# Patient Record
Sex: Male | Born: 1935 | Race: White | Hispanic: No | Marital: Married | Smoking: Never smoker
Health system: Southern US, Community
[De-identification: ages and names within clinical notes are randomized; demographics above are authoritative.]

## PROBLEM LIST (undated history)

## (undated) ENCOUNTER — Emergency Department (HOSPITAL_COMMUNITY): Payer: Medicare Other | Source: Home / Self Care

## (undated) DIAGNOSIS — F0280 Dementia in other diseases classified elsewhere without behavioral disturbance: Secondary | ICD-10-CM

## (undated) HISTORY — PX: CHOLECYSTECTOMY: SHX55

## (undated) HISTORY — PX: BACK SURGERY: SHX140

## (undated) HISTORY — PX: HERNIA REPAIR: SHX51

## (undated) HISTORY — PX: APPENDECTOMY: SHX54

---
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
## 2003-01-21 ENCOUNTER — Ambulatory Visit (HOSPITAL_COMMUNITY): Admission: RE | Admit: 2003-01-21 | Discharge: 2003-01-21 | Payer: Self-pay | Admitting: *Deleted

## 2005-01-27 ENCOUNTER — Encounter: Admission: RE | Admit: 2005-01-27 | Discharge: 2005-01-27 | Payer: Self-pay | Admitting: Internal Medicine

## 2005-01-29 ENCOUNTER — Encounter: Admission: RE | Admit: 2005-01-29 | Discharge: 2005-01-29 | Payer: Self-pay | Admitting: Internal Medicine

## 2005-01-31 ENCOUNTER — Encounter: Admission: RE | Admit: 2005-01-31 | Discharge: 2005-01-31 | Payer: Self-pay | Admitting: Internal Medicine

## 2005-05-28 ENCOUNTER — Emergency Department (HOSPITAL_COMMUNITY): Admission: EM | Admit: 2005-05-28 | Discharge: 2005-05-28 | Payer: Self-pay | Admitting: Emergency Medicine

## 2005-11-12 ENCOUNTER — Encounter: Admission: RE | Admit: 2005-11-12 | Discharge: 2005-11-12 | Payer: Self-pay | Admitting: Neurological Surgery

## 2008-08-15 ENCOUNTER — Encounter: Admission: RE | Admit: 2008-08-15 | Discharge: 2008-08-15 | Payer: Self-pay | Admitting: General Surgery

## 2009-10-07 ENCOUNTER — Emergency Department (HOSPITAL_COMMUNITY): Admission: EM | Admit: 2009-10-07 | Discharge: 2009-10-08 | Payer: Self-pay | Admitting: Emergency Medicine

## 2009-11-06 ENCOUNTER — Encounter (INDEPENDENT_AMBULATORY_CARE_PROVIDER_SITE_OTHER): Payer: Self-pay | Admitting: General Surgery

## 2009-11-06 ENCOUNTER — Ambulatory Visit (HOSPITAL_COMMUNITY): Admission: RE | Admit: 2009-11-06 | Discharge: 2009-11-07 | Payer: Self-pay | Admitting: General Surgery

## 2011-04-01 LAB — COMPREHENSIVE METABOLIC PANEL
ALT: 21 U/L (ref 0–53)
AST: 31 U/L (ref 0–37)
Albumin: 3.8 g/dL (ref 3.5–5.2)
Alkaline Phosphatase: 93 U/L (ref 39–117)
BUN: 15 mg/dL (ref 6–23)
CO2: 29 mEq/L (ref 19–32)
Calcium: 8.7 mg/dL (ref 8.4–10.5)
Chloride: 105 mEq/L (ref 96–112)
Creatinine, Ser: 1.46 mg/dL (ref 0.4–1.5)
GFR calc Af Amer: 58 mL/min — ABNORMAL LOW (ref >60)
GFR calc non Af Amer: 48 mL/min — ABNORMAL LOW (ref >60)
Glucose, Bld: 131 mg/dL — ABNORMAL HIGH (ref 70–99)
Potassium: 4.2 mEq/L (ref 3.5–5.1)
Sodium: 141 mEq/L (ref 135–145)
Total Bilirubin: 0.7 mg/dL (ref 0.3–1.2)
Total Protein: 7.2 g/dL (ref 6.0–8.3)

## 2011-04-01 LAB — DIFFERENTIAL
Basophils Absolute: 0 10*3/uL (ref 0.0–0.1)
Basophils Relative: 0 % (ref 0–1)
Eosinophils Absolute: 0.4 10*3/uL (ref 0.0–0.7)
Eosinophils Relative: 5 % (ref 0–5)
Lymphocytes Relative: 22 % (ref 12–46)
Lymphs Abs: 1.7 10*3/uL (ref 0.7–4.0)
Monocytes Absolute: 0.6 10*3/uL (ref 0.1–1.0)
Monocytes Relative: 8 % (ref 3–12)
Neutro Abs: 5 10*3/uL (ref 1.7–7.7)
Neutrophils Relative %: 64 % (ref 43–77)

## 2011-04-01 LAB — CBC
HCT: 41.5 % (ref 39.0–52.0)
Hemoglobin: 14.3 g/dL (ref 13.0–17.0)
MCHC: 34.4 g/dL (ref 30.0–36.0)
MCV: 91.1 fL (ref 78.0–100.0)
Platelets: 176 10*3/uL (ref 150–400)
RBC: 4.55 MIL/uL (ref 4.22–5.81)
RDW: 13.5 % (ref 11.5–15.5)
WBC: 7.7 10*3/uL (ref 4.0–10.5)

## 2011-04-01 LAB — POCT CARDIAC MARKERS
CKMB, poc: 2.1 ng/mL (ref 1.0–8.0)
CKMB, poc: 3.6 ng/mL (ref 1.0–8.0)
Myoglobin, poc: 122 ng/mL (ref 12–200)
Myoglobin, poc: 123 ng/mL (ref 12–200)
Troponin i, poc: <0.05 ng/mL (ref 0.00–0.09)
Troponin i, poc: <0.05 ng/mL (ref 0.00–0.09)

## 2011-04-01 LAB — D-DIMER, QUANTITATIVE: D-Dimer, Quant: 0.51 ug/mL-FEU — ABNORMAL HIGH (ref 0.00–0.48)

## 2011-04-01 LAB — LIPASE, BLOOD: Lipase: 23 U/L (ref 11–59)

## 2014-06-27 ENCOUNTER — Encounter (INDEPENDENT_AMBULATORY_CARE_PROVIDER_SITE_OTHER): Payer: Self-pay

## 2014-06-27 ENCOUNTER — Other Ambulatory Visit: Payer: Self-pay | Admitting: Internal Medicine

## 2014-06-27 ENCOUNTER — Ambulatory Visit
Admission: RE | Admit: 2014-06-27 | Discharge: 2014-06-27 | Disposition: A | Discharge status: Home / Self Care | Payer: Medicare Other | Source: Ambulatory Visit | Attending: Internal Medicine | Admitting: Internal Medicine

## 2014-06-27 DIAGNOSIS — R1030 Lower abdominal pain, unspecified: Secondary | ICD-10-CM

## 2014-06-29 ENCOUNTER — Emergency Department (HOSPITAL_COMMUNITY)
Admission: EM | Admit: 2014-06-29 | Discharge: 2014-06-29 | Disposition: A | Discharge status: Home / Self Care | Payer: Medicare Other | Attending: Emergency Medicine | Admitting: Emergency Medicine

## 2014-06-29 ENCOUNTER — Encounter (HOSPITAL_COMMUNITY): Payer: Self-pay | Admitting: Emergency Medicine

## 2014-06-29 DIAGNOSIS — N200 Calculus of kidney: Secondary | ICD-10-CM | POA: Diagnosis present

## 2014-06-29 LAB — URINE MICROSCOPIC-ADD ON

## 2014-06-29 LAB — I-STAT CHEM 8, ED
BUN: 17 mg/dL (ref 6–23)
Calcium, Ion: 1.18 mmol/L (ref 1.13–1.30)
Chloride: 101 mEq/L (ref 96–112)
Creatinine, Ser: 1.4 mg/dL — ABNORMAL HIGH (ref 0.50–1.35)
Glucose, Bld: 142 mg/dL — ABNORMAL HIGH (ref 70–99)
HEMATOCRIT: 46 % (ref 39.0–52.0)
Hemoglobin: 15.6 g/dL (ref 13.0–17.0)
POTASSIUM: 4 meq/L (ref 3.7–5.3)
SODIUM: 142 meq/L (ref 137–147)
TCO2: 24 mmol/L (ref 0–100)

## 2014-06-29 LAB — URINALYSIS, ROUTINE W REFLEX MICROSCOPIC
Bilirubin Urine: NEGATIVE
Glucose, UA: NEGATIVE mg/dL
Ketones, ur: NEGATIVE mg/dL
Leukocytes, UA: NEGATIVE
Nitrite: NEGATIVE
Protein, ur: 30 mg/dL — AB
SPECIFIC GRAVITY, URINE: 1.027 (ref 1.005–1.030)
UROBILINOGEN UA: 1 mg/dL (ref 0.0–1.0)
pH: 5 (ref 5.0–8.0)

## 2014-06-29 MED ORDER — KETOROLAC TROMETHAMINE 30 MG/ML IJ SOLN
15.0000 mg | Freq: Once | INTRAMUSCULAR | Status: DC
  Filled 2014-06-29: qty 1

## 2014-06-29 MED ORDER — TAMSULOSIN HCL 0.4 MG PO CAPS: 0.4000 mg | ORAL_CAPSULE | Freq: Every day | ORAL | Status: DC

## 2014-06-29 MED ORDER — HYDROCODONE-ACETAMINOPHEN 5-325 MG PO TABS: 1.0000 | ORAL_TABLET | ORAL | Status: DC | PRN

## 2014-06-29 MED ORDER — ONDANSETRON HCL 4 MG PO TABS: 4.0000 mg | ORAL_TABLET | Freq: Four times a day (QID) | ORAL | Status: DC

## 2014-06-29 MED ORDER — ONDANSETRON HCL 4 MG/2ML IJ SOLN
4.0000 mg | Freq: Once | INTRAMUSCULAR | Status: AC
  Administered 2014-06-29: 4 mg via INTRAVENOUS
  Filled 2014-06-29: qty 2

## 2014-06-29 MED ORDER — SODIUM CHLORIDE 0.9 % IV BOLUS (SEPSIS)
1000.0000 mL | Freq: Once | INTRAVENOUS | Status: AC
  Administered 2014-06-29: 1000 mL via INTRAVENOUS

## 2014-06-29 MED ORDER — KETOROLAC TROMETHAMINE 15 MG/ML IJ SOLN
15.0000 mg | Freq: Once | INTRAMUSCULAR | Status: AC
  Administered 2014-06-29: 15 mg via INTRAVENOUS

## 2014-06-29 NOTE — ED Provider Notes (Signed)
TIME SEEN: 2:31 PM  CHIEF COMPLAINT: Left flank pain  HPI: Patient is a 78 year old male with no significant past medical history who presents the emergency department with left flank pain for the past 5 days. He had a CT scan as an outpatient on Thursday, 4 days ago was diagnosed with a left 4 mm kidney stone. He states that he was seen by a physician but refuses any pain medication at that time. He states that he has not been able to control his pain at home and feels that he does need pain medication now. He denies any fevers, chills, vomiting or diarrhea. He is able to urinate normally. No pain with urination.  ROS: See HPI Constitutional: no fever  Eyes: no drainage  ENT: no runny nose   Cardiovascular:  no chest pain  Resp: no SOB  GI: no vomiting GU: no dysuria Integumentary: no rash  Allergy: no hives  Musculoskeletal: no leg swelling  Neurological: no slurred speech ROS otherwise negative  PAST MEDICAL HISTORY/PAST SURGICAL HISTORY:  History reviewed. No pertinent past medical history.  MEDICATIONS:  Prior to Admission medications   Not on File    ALLERGIES:  Not on File  SOCIAL HISTORY:  History  Substance Use Topics  . Smoking status: Never Smoker   . Smokeless tobacco: Not on file  . Alcohol Use: No    FAMILY HISTORY: No family history on file.  EXAM: BP 136/56  Pulse 55  Temp(Src) 97.8 F (36.6 C) (Oral)  Resp 18  SpO2 99% CONSTITUTIONAL: Alert and oriented and responds appropriately to questions. Well-appearing; well-nourished, appears uncomfortable but is not in any distress HEAD: Normocephalic EYES: Conjunctivae clear, PERRL ENT: normal nose; no rhinorrhea; moist mucous membranes; pharynx without lesions noted NECK: Supple, no meningismus, no LAD  CARD: RRR; S1 and S2 appreciated; no murmurs, no clicks, no rubs, no gallops RESP: Normal chest excursion without splinting or tachypnea; breath sounds clear and equal bilaterally; no wheezes, no  rhonchi, no rales,  ABD/GI: Normal bowel sounds; non-distended; soft, non-tender, no rebound, no guarding, no peritoneal signs BACK:  The back appears normal and is non-tender to palpation, there is no CVA tenderness EXT: Normal ROM in all joints; non-tender to palpation; no edema; normal capillary refill; no cyanosis    SKIN: Normal color for age and race; warm NEURO: Moves all extremities equally PSYCH: The patient's mood and manner are appropriate. Grooming and personal hygiene are appropriate.  MEDICAL DECISION MAKING: Patient here with pain from his 4 mm kidney stone. We'll check basic labs, urinalysis. We'll give IV fluids, Zofran Toradol and reassess. Patient is well-appearing, nontoxic, in no distress.  ED PROGRESS: Patient has mildly elevated creatinine which has been his baseline since 2012. Urine shows large hemoglobin and many bacteria. No other sign of infection. Culture pending. He reports he is completely pain-free after 15 mg of IV Toradol. We'll discharge home with prescription for Vicodin, Zofran and Flomax. We'll give [facility] followup information. He states he has a urine strainer. He states he is started on amoxicillin for possible UTI. Discussed strict return precautions and supportive care instructions. Patient verbalizes understanding and is comfortable with plan.     Layla MawKristen N Ward, DO 06/29/14 1525

## 2014-06-29 NOTE — ED Notes (Signed)
Pt c/o left flank pain. Pt had CT scan on Thursday and has kidney stone. Pt has been trying to pass the stone but pain got to severe today. Pt denies denies any trouble with urination.

## 2014-06-29 NOTE — Discharge Instructions (Signed)

## 2014-06-30 LAB — URINE CULTURE
COLONY COUNT: NO GROWTH
Culture: NO GROWTH

## 2014-09-25 ENCOUNTER — Other Ambulatory Visit: Payer: Self-pay | Admitting: Gastroenterology

## 2014-09-25 DIAGNOSIS — R1084 Generalized abdominal pain: Secondary | ICD-10-CM

## 2014-09-26 ENCOUNTER — Inpatient Hospital Stay: Admission: RE | Admit: 2014-09-26 | Payer: Medicare Other | Source: Ambulatory Visit

## 2014-10-01 ENCOUNTER — Ambulatory Visit
Admission: RE | Admit: 2014-10-01 | Discharge: 2014-10-01 | Disposition: A | Discharge status: Home / Self Care | Payer: Medicare Other | Source: Ambulatory Visit | Attending: Gastroenterology | Admitting: Gastroenterology

## 2014-10-01 DIAGNOSIS — R1084 Generalized abdominal pain: Secondary | ICD-10-CM

## 2014-10-01 MED ORDER — IOHEXOL 300 MG/ML  SOLN
100.0000 mL | Freq: Once | INTRAMUSCULAR | Status: AC | PRN
  Administered 2014-10-01: 100 mL via INTRAVENOUS

## 2014-10-17 ENCOUNTER — Other Ambulatory Visit (INDEPENDENT_AMBULATORY_CARE_PROVIDER_SITE_OTHER): Payer: Self-pay

## 2014-10-17 DIAGNOSIS — R1084 Generalized abdominal pain: Secondary | ICD-10-CM

## 2014-10-17 DIAGNOSIS — R1012 Left upper quadrant pain: Secondary | ICD-10-CM

## 2014-10-18 ENCOUNTER — Other Ambulatory Visit (INDEPENDENT_AMBULATORY_CARE_PROVIDER_SITE_OTHER): Payer: Self-pay | Admitting: *Deleted

## 2014-10-29 ENCOUNTER — Telehealth (INDEPENDENT_AMBULATORY_CARE_PROVIDER_SITE_OTHER): Payer: Self-pay

## 2014-10-29 NOTE — Telephone Encounter (Signed)
Called and spoke to patients wife regarding CT Biopsy being cancelled.  Explained to Ms. Annie Parasingold that Dr. Annabell HowellsWrenn with Radiology did not feel patient was an ideal candidate for a CT Biopsy.  Patient was also presented at the GI Conference and Pysicians feel that Dr. Annabell HowellsWrenn is correct.  Patient may be a candidate for antibiotics or steroids but, needs an office appointment to come in to discuss in further detail with Dr. Derrell Lollingamirez.  Patient scheduled for office visit on 11/04/14 @ 2:10 with Dr. Derrell Lollingamirez.  Patient wife verbalized understanding.

## 2014-10-29 NOTE — Telephone Encounter (Signed)
-----   Message from Grace IsaacStephanie Mosher sent at 10/29/2014  3:15 PM EST ----- Neysa Bonitohristy, This patient needs to come in for an office visit per AR.  IR could not do the bx. Patient's wife calling stating he is in a lot of pain.  Thanks, [employer]ph

## 2015-02-20 DIAGNOSIS — H2513 Age-related nuclear cataract, bilateral: Secondary | ICD-10-CM | POA: Diagnosis not present

## 2015-04-29 DIAGNOSIS — R5383 Other fatigue: Secondary | ICD-10-CM | POA: Diagnosis not present

## 2015-04-29 DIAGNOSIS — Z125 Encounter for screening for malignant neoplasm of prostate: Secondary | ICD-10-CM | POA: Diagnosis not present

## 2015-04-29 DIAGNOSIS — Z79899 Other long term (current) drug therapy: Secondary | ICD-10-CM | POA: Diagnosis not present

## 2015-04-29 DIAGNOSIS — E780 Pure hypercholesterolemia: Secondary | ICD-10-CM | POA: Diagnosis not present

## 2015-04-29 DIAGNOSIS — Z1389 Encounter for screening for other disorder: Secondary | ICD-10-CM | POA: Diagnosis not present

## 2015-07-28 ENCOUNTER — Other Ambulatory Visit: Payer: Self-pay | Admitting: Physician Assistant

## 2015-07-28 DIAGNOSIS — R1012 Left upper quadrant pain: Secondary | ICD-10-CM

## 2015-07-28 DIAGNOSIS — R1011 Right upper quadrant pain: Secondary | ICD-10-CM

## 2015-07-31 ENCOUNTER — Ambulatory Visit
Admission: RE | Admit: 2015-07-31 | Discharge: 2015-07-31 | Disposition: A | Discharge status: Home / Self Care | Payer: Medicare Other | Source: Ambulatory Visit | Attending: Physician Assistant | Admitting: Physician Assistant

## 2015-07-31 DIAGNOSIS — R1012 Left upper quadrant pain: Secondary | ICD-10-CM

## 2015-07-31 DIAGNOSIS — R1011 Right upper quadrant pain: Secondary | ICD-10-CM

## 2015-07-31 MED ORDER — IOPAMIDOL (ISOVUE-300) INJECTION 61%
100.0000 mL | Freq: Once | INTRAVENOUS | Status: AC | PRN
  Administered 2015-07-31: 100 mL via INTRAVENOUS

## 2016-07-13 ENCOUNTER — Other Ambulatory Visit: Payer: Self-pay | Admitting: Internal Medicine

## 2016-07-13 DIAGNOSIS — F0390 Unspecified dementia without behavioral disturbance: Secondary | ICD-10-CM

## 2016-07-16 ENCOUNTER — Inpatient Hospital Stay: Admission: RE | Admit: 2016-07-16 | Payer: Medicare Other | Source: Ambulatory Visit

## 2016-07-16 ENCOUNTER — Ambulatory Visit
Admission: RE | Admit: 2016-07-16 | Discharge: 2016-07-16 | Disposition: A | Discharge status: Home / Self Care | Payer: Medicare Other | Source: Ambulatory Visit | Attending: Internal Medicine | Admitting: Internal Medicine

## 2017-04-01 ENCOUNTER — Ambulatory Visit (INDEPENDENT_AMBULATORY_CARE_PROVIDER_SITE_OTHER): Payer: Medicare Other | Admitting: Neurology

## 2017-04-01 ENCOUNTER — Encounter: Payer: Self-pay | Admitting: Neurology

## 2017-04-01 DIAGNOSIS — R413 Other amnesia: Secondary | ICD-10-CM | POA: Diagnosis not present

## 2017-04-01 NOTE — Progress Notes (Signed)
Reason for visit: Memory disturbance  Referring physician: Dr. Cristal [name] is a 81 y.o. male  History of present illness:  Mr. Kyle [name] is a 81 year old right-handed white male with a history of a progressive memory disorder. The wife believes that he has had memory problems for about 2 years. The patient has developed a short-term memory issue, he has difficulty with remembering names for people, he will sometimes lose his train of thought with a conversation. He will misplace things about the house on occasion. The patient still operates a motor vehicle without difficulty, he has not had problems with safety or with directions. The wife does all the finances, takes care of the medications and keeps up with appointments, and she always has. The patient is physically active, he has a good energy level. The patient denies any numbness or weakness of the face, arms, or legs. He denies problems with balance or difficulty controlling the bowels or the bladder. The patient had been on amitriptyline for a period of time, he is off the medication now. There is no significant family medical history of dementia. He comes to this office for an evaluation. He has had a CT scan of the brain that was done in July 2017 that does not show extensive white matter changes, he has been placed on Namenda and is tolerating the medication well, apparently he had diarrhea on Aricept. The patient has had blood work done to include a thyroid profile, RPR, and B12 level that were unremarkable.  History reviewed. No pertinent past medical history.  Past Surgical History:  Procedure Laterality Date  . APPENDECTOMY    . BACK SURGERY    . CHOLECYSTECTOMY    . HERNIA REPAIR      Family History  Problem Relation Age of Onset  . Pneumonia Mother   . Kidney failure Father     Social history:  reports that he has never smoked. He has never used smokeless tobacco. He reports that he does not drink alcohol or  use drugs.  Medications:  Prior to Admission medications   Medication Sig Start Date End Date Taking? Authorizing Provider  memantine (NAMENDA) 10 MG tablet Take 10 mg by mouth 2 (two) times daily.   Yes Historical Provider, MD      Allergies  Allergen Reactions  . Demerol [Meperidine] Nausea And Vomiting    ROS:  Out of a complete 14 system review of symptoms, the patient complains only of the following symptoms, and all other reviewed systems are negative.  Memory loss  Blood pressure (!) 159/74, pulse (!) 53, height 6' (1.829 m), weight 197 lb (89.4 kg).  Physical Exam  General: The patient is alert and cooperative at the time of the examination.  Eyes: Pupils are equal, round, and reactive to light. Discs are flat bilaterally.  Neck: The neck is supple, no carotid bruits are noted.  Respiratory: The respiratory examination is clear.  Cardiovascular: The cardiovascular examination reveals a regular rate and rhythm, no obvious murmurs or rubs are noted.  Skin: Extremities are without significant edema.  Neurologic Exam  Mental status: The patient is alert and oriented x 3 at the time of the examination. The Mini-Mental Status Examination done today shows a total score of 19/30..  Cranial nerves: Facial symmetry is present. There is good sensation of the face to pinprick and soft touch bilaterally. The strength of the facial muscles and the muscles to head turning and shoulder shrug are normal bilaterally.  Speech is well enunciated, no aphasia or dysarthria is noted. Extraocular movements are full. Visual fields are full. The tongue is midline, and the patient has symmetric elevation of the soft palate. No obvious hearing deficits are noted.  Motor: The motor testing reveals 5 over 5 strength of all 4 extremities. Good symmetric motor tone is noted throughout.  Sensory: Sensory testing is intact to pinprick, soft touch, vibration sensation, and position sense on all 4  extremities. No evidence of extinction is noted.  Coordination: Cerebellar testing reveals good finger-nose-finger and heel-to-shin bilaterally.  Gait and station: Gait is normal. Tandem gait is normal. Romberg is negative. No drift is seen.  Reflexes: Deep tendon reflexes are symmetric and normal bilaterally. Toes are downgoing bilaterally.   CT head 07/16/16:  IMPRESSION: 1.  No acute intracranial abnormality. 2.  Cerebral atrophy and small vessel ischemic change.  * CT scan images were reviewed online. I agree with the written report.    Assessment/Plan:  1. Progressive memory disturbance  The patient appears to have significant issues with memory at this time. The driving will need to be scrutinized closely over the next months or years. The patient is on Namenda, we may consider addition of an Exelon patch in the future to hopefully avoid the side effects of diarrhea, but the a she does not wish to start another medication now. We will follow-up in about 6 months.  Kyle Palau MD 04/01/2017 11:24 AM  [facility] [address] [place], [place] [address]  Phone [phone] Fax [phone]

## 2017-04-07 ENCOUNTER — Ambulatory Visit: Payer: Medicare Other | Admitting: Neurology

## 2017-10-13 ENCOUNTER — Encounter: Payer: Self-pay | Admitting: Neurology

## 2017-10-13 ENCOUNTER — Ambulatory Visit (INDEPENDENT_AMBULATORY_CARE_PROVIDER_SITE_OTHER): Payer: Medicare Other | Admitting: Neurology

## 2017-10-13 VITALS — BP 161/71 | HR 50 | Ht 72.0 in | Wt 195.0 lb

## 2017-10-13 DIAGNOSIS — R413 Other amnesia: Secondary | ICD-10-CM | POA: Diagnosis not present

## 2017-10-13 NOTE — Progress Notes (Signed)
Reason for visit: Memory disturbance  Kyle [name] is an 81 y.o. male  History of present illness:  Mr. Kyle [name] is a 81 year old right-handed white male with a history of a progressive memory disturbance. The patient is been on Namenda, he could not tolerate Aricept previously. He has not had any problems on the Namenda. He comes in with his wife, they claimed that the head been no significant changes in his cognitive status since last seen. He apparently continues to drive a car without difficulty, he stays very active doing work around the house. He has not had any problems with loss of ability of prior skills. The patient is sleeping well at night, he is well rested during the day. He returns to this office for an evaluation.  History reviewed. No pertinent past medical history.  Past Surgical History:  Procedure Laterality Date  . APPENDECTOMY    . BACK SURGERY    . CHOLECYSTECTOMY    . HERNIA REPAIR      Family History  Problem Relation Age of Onset  . Pneumonia Mother   . Kidney failure Father     Social history:  reports that he has never smoked. He has never used smokeless tobacco. He reports that he does not drink alcohol or use drugs.    Allergies  Allergen Reactions  . Demerol [Meperidine] Nausea And Vomiting    Medications:  Prior to Admission medications   Medication Sig Start Date End Date Taking? Authorizing Provider  memantine (NAMENDA) 10 MG tablet Take 10 mg by mouth 2 (two) times daily.   Yes [provider]    ROS:  Out of a complete 14 system review of symptoms, the patient complains only of the following symptoms, and all other reviewed systems are negative.  Memory disturbance  Blood pressure (!) 161/71, pulse (!) 50, height 6' (1.829 m), weight 195 lb (88.5 kg).  Physical Exam  General: The patient is alert and cooperative at the time of the examination.  Skin: No significant peripheral edema is noted.   Neurologic  Exam  Mental status: The patient is alert and oriented x 2 at the time of the examination (not oriented to date). The Mini-Mental Status Examination done today shows a total score of 12/30.   Cranial nerves: Facial symmetry is present. Speech is normal, no aphasia or dysarthria is noted. Extraocular movements are full. Visual fields are full.  Motor: The patient has good strength in all 4 extremities.  Sensory examination: Soft touch sensation is symmetric on the face, arms, and legs.  Coordination: The patient has good finger-nose-finger and heel-to-shin bilaterally.  Gait and station: The patient has a normal gait. Tandem gait is unsteady. Romberg is negative. No drift is seen.  Reflexes: Deep tendon reflexes are symmetric.   Assessment/Plan:  1. Memory disturbance  I have offered a trial on an Exelon patch to see if he can tolerate this, they are not interested in trying any new medication. The Namenda comes through the primary care physician at this time. The patient continues to operate a motor vehicle, I have indicated that this activity needs to be scrutinized closely, if he begins to have some safety issues or difficulty with directions, we will need to curtail the driving. Otherwise, the patient will follow-up through this office if needed. They may contact our office at any time for questions or concerns.   Marlan Palau. Keith Leylah Tarnow MD 10/13/2017 9:05 AM  [facility] [address]  [place], [place] [address]  Phone [phone] Fax [phone]

## 2018-02-01 ENCOUNTER — Emergency Department (HOSPITAL_COMMUNITY)
Admission: EM | Admit: 2018-02-01 | Discharge: 2018-02-01 | Disposition: A | Discharge status: Home / Self Care | Payer: Medicare Other | Attending: Emergency Medicine | Admitting: Emergency Medicine

## 2018-02-01 ENCOUNTER — Encounter (HOSPITAL_COMMUNITY): Payer: Self-pay

## 2018-02-01 ENCOUNTER — Emergency Department (HOSPITAL_COMMUNITY): Payer: Medicare Other

## 2018-02-01 ENCOUNTER — Other Ambulatory Visit: Payer: Self-pay

## 2018-02-01 DIAGNOSIS — R112 Nausea with vomiting, unspecified: Secondary | ICD-10-CM | POA: Insufficient documentation

## 2018-02-01 DIAGNOSIS — N200 Calculus of kidney: Secondary | ICD-10-CM | POA: Diagnosis not present

## 2018-02-01 DIAGNOSIS — Z79899 Other long term (current) drug therapy: Secondary | ICD-10-CM | POA: Insufficient documentation

## 2018-02-01 DIAGNOSIS — R109 Unspecified abdominal pain: Secondary | ICD-10-CM | POA: Diagnosis present

## 2018-02-01 LAB — CBC WITH DIFFERENTIAL/PLATELET
Basophils Absolute: 0 10*3/uL (ref 0.0–0.1)
Basophils Relative: 0 %
EOS ABS: 0.1 10*3/uL (ref 0.0–0.7)
Eosinophils Relative: 1 %
HCT: 44.5 % (ref 39.0–52.0)
Hemoglobin: 14.7 g/dL (ref 13.0–17.0)
LYMPHS ABS: 1.6 10*3/uL (ref 0.7–4.0)
Lymphocytes Relative: 17 %
MCH: 30.6 pg (ref 26.0–34.0)
MCHC: 33 g/dL (ref 30.0–36.0)
MCV: 92.7 fL (ref 78.0–100.0)
MONOS PCT: 6 %
Monocytes Absolute: 0.5 10*3/uL (ref 0.1–1.0)
Neutro Abs: 7.3 10*3/uL (ref 1.7–7.7)
Neutrophils Relative %: 76 %
Platelets: 168 10*3/uL (ref 150–400)
RBC: 4.8 MIL/uL (ref 4.22–5.81)
RDW: 13.3 % (ref 11.5–15.5)
WBC: 9.5 10*3/uL (ref 4.0–10.5)

## 2018-02-01 LAB — COMPREHENSIVE METABOLIC PANEL
ALBUMIN: 3.8 g/dL (ref 3.5–5.0)
ALK PHOS: 96 U/L (ref 38–126)
ALT: 17 U/L (ref 17–63)
ANION GAP: 11 (ref 5–15)
AST: 23 U/L (ref 15–41)
BILIRUBIN TOTAL: 0.8 mg/dL (ref 0.3–1.2)
BUN: 16 mg/dL (ref 6–20)
CO2: 23 mmol/L (ref 22–32)
Calcium: 9.1 mg/dL (ref 8.9–10.3)
Chloride: 104 mmol/L (ref 101–111)
Creatinine, Ser: 1.41 mg/dL — ABNORMAL HIGH (ref 0.61–1.24)
GFR calc non Af Amer: 45 mL/min — ABNORMAL LOW (ref >60)
GFR, EST AFRICAN AMERICAN: 53 mL/min — AB (ref >60)
Glucose, Bld: 127 mg/dL — ABNORMAL HIGH (ref 65–99)
Potassium: 4.6 mmol/L (ref 3.5–5.1)
Sodium: 138 mmol/L (ref 135–145)
TOTAL PROTEIN: 7 g/dL (ref 6.5–8.1)

## 2018-02-01 LAB — LIPASE, BLOOD: LIPASE: 24 U/L (ref 11–51)

## 2018-02-01 LAB — URINALYSIS, ROUTINE W REFLEX MICROSCOPIC
Bacteria, UA: NONE SEEN
Bilirubin Urine: NEGATIVE
Glucose, UA: NEGATIVE mg/dL
KETONES UR: NEGATIVE mg/dL
LEUKOCYTES UA: NEGATIVE
Nitrite: NEGATIVE
PH: 5 (ref 5.0–8.0)
Protein, ur: NEGATIVE mg/dL
Specific Gravity, Urine: 1.017 (ref 1.005–1.030)
Squamous Epithelial / LPF: NONE SEEN

## 2018-02-01 MED ORDER — OXYCODONE-ACETAMINOPHEN 5-325 MG PO TABS
1.0000 | ORAL_TABLET | Freq: Once | ORAL | Status: AC
Start: 2018-02-01 — End: 2018-02-01
  Administered 2018-02-01: 1 via ORAL
  Filled 2018-02-01: qty 1

## 2018-02-01 MED ORDER — DEXTROSE 5 % IV SOLN: 1.0000 g | Freq: Once | INTRAVENOUS | Status: DC

## 2018-02-01 MED ORDER — ONDANSETRON 4 MG PO TBDP
4.0000 mg | ORAL_TABLET | Freq: Once | ORAL | Status: AC
  Administered 2018-02-01: 4 mg via ORAL
  Filled 2018-02-01: qty 1

## 2018-02-01 MED ORDER — SODIUM CHLORIDE 0.9 % IV BOLUS (SEPSIS): 1000.0000 mL | Freq: Once | INTRAVENOUS | Status: DC

## 2018-02-01 NOTE — Discharge Instructions (Signed)
Take tylenol for any residual pain. You had a kidney stone that you passed. Please follow up with family doctor as needed. If recurrent pain or any more problems, please return to ED.

## 2018-02-01 NOTE — ED Triage Notes (Signed)
Pt states that he woke up and began to have R sided flank pain after urinating.

## 2018-02-01 NOTE — ED Provider Notes (Signed)
[facility] EMERGENCY DEPARTMENT Provider Note   CSN: [id number] Arrival date & time: 02/01/18  0256     History   Chief Complaint Chief Complaint  Patient presents with  . Flank Pain    HPI Kyle [name] is a 82 y.o. male.  HPI Kyle [name] is a 82 y.o. male with history of prior appendectomy, cholecystectomy, hernia repair, presents to emergency department with complaint of abdominal pain, nausea, vomiting.  Patient states his symptoms began early this morning around 2 AM.  Pain is mainly in the right flank and right lower abdomen.  Pain is sharp.  Patient had associated nausea and several episodes of emesis.  He was given 4 mg of ODT Zofran and oxycodone upon arrival to emergency department which she states he believes he threw up.  He states shortly after vomiting however hip pain improved.  He denies any diarrhea.  Last bowel movement this morning and normal.  Denies any blood in his stool or emesis.  Currently no complaints.  No recent illnesses.  No URI symptoms.  History reviewed. No pertinent past medical history.  Patient Active Problem List   Diagnosis Date Noted  . Memory difficulty 04/01/2017    Past Surgical History:  Procedure Laterality Date  . APPENDECTOMY    . BACK SURGERY    . CHOLECYSTECTOMY    . HERNIA REPAIR         Home Medications    Prior to Admission medications   Medication Sig Start Date End Date Taking? Authorizing Provider  memantine (NAMENDA) 10 MG tablet Take 10 mg by mouth 2 (two) times daily.    [provider]    Family History Family History  Problem Relation Age of Onset  . Pneumonia Mother   . Kidney failure Father     Social History Social History   Tobacco Use  . Smoking status: Never Smoker  . Smokeless tobacco: Never Used  Substance Use Topics  . Alcohol use: No  . Drug use: No     Allergies   Demerol [meperidine]   Review of Systems Review of Systems  Constitutional:  Negative for chills and fever.  Respiratory: Negative for cough, chest tightness and shortness of breath.   Cardiovascular: Negative for chest pain, palpitations and leg swelling.  Gastrointestinal: Positive for abdominal pain, nausea and vomiting. Negative for abdominal distention and diarrhea.  Genitourinary: Positive for flank pain. Negative for dysuria, frequency, hematuria and urgency.  Musculoskeletal: Negative for arthralgias, myalgias, neck pain and neck stiffness.  Skin: Negative for rash.  Allergic/Immunologic: Negative for immunocompromised state.  Neurological: Negative for dizziness, weakness, light-headedness, numbness and headaches.  All other systems reviewed and are negative.    Physical Exam Updated Vital Signs BP (!) 156/70   Pulse (!) 48   Temp 97.9 F (36.6 C) (Oral)   Resp 18   SpO2 96%   Physical Exam  Constitutional: He appears well-developed and well-nourished. No distress.  HENT:  Head: Normocephalic and atraumatic.  Eyes: Conjunctivae are normal.  Neck: Neck supple.  Cardiovascular: Normal rate, regular rhythm and normal heart sounds.  Pulmonary/Chest: Effort normal. No respiratory distress. He has no wheezes. He has no rales.  Abdominal: Soft. Bowel sounds are normal. He exhibits no distension. There is no tenderness. There is no rebound.  Musculoskeletal: He exhibits no edema.  Neurological: He is alert.  Skin: Skin is warm and dry.  Nursing note and vitals reviewed.    ED Treatments / Results  Labs (all labs ordered are listed, but only abnormal results are displayed) Labs Reviewed  URINALYSIS, ROUTINE W REFLEX MICROSCOPIC - Abnormal; Notable for the following components:      Result Value   Hgb urine dipstick LARGE (*)    All other components within normal limits  COMPREHENSIVE METABOLIC PANEL - Abnormal; Notable for the following components:   Glucose, Bld 127 (*)    Creatinine, Ser 1.41 (*)    GFR calc non Af Amer 45 (*)    GFR calc  Af Amer 53 (*)    All other components within normal limits  CBC WITH DIFFERENTIAL/PLATELET  LIPASE, BLOOD    EKG  EKG Interpretation None       Radiology Ct Renal Stone Study  Result Date: 02/01/2018 CLINICAL DATA:  Right flank pain EXAM: CT ABDOMEN AND PELVIS WITHOUT CONTRAST TECHNIQUE: Multidetector CT imaging of the abdomen and pelvis was performed following the standard protocol without oral or intravenous contrast. COMPARISON:  July 31, 2015 FINDINGS: Lower chest: There is mild bibasilar lung scarring. There are foci of coronary artery calcification. Hepatobiliary: No focal liver lesions are apparent on this noncontrast enhanced study. Gallbladder is absent. There is no biliary duct dilatation. Pancreas: There is a stable partially cystic area in the tail of the pancreas measuring 1.4 x 1.3 cm. No new pancreatic lesion evident. No pancreatic duct dilatation or inflammation. Spleen: No splenic lesions evident. There is a stable partially calcified splenic artery aneurysm measuring 1.5 x 1.1 cm. Adrenals/Urinary Tract: Adrenals appear normal bilaterally. There is no appreciable renal mass on either side. There is mild hydronephrosis on the right. There is no hydronephrosis on the left. There is no appreciable renal calculus on either side. There is no evident ureteral calculus on either side. There is a 1 mm calculus along the posterior aspect of the urinary bladder slightly to the left of midline. There is no urinary bladder wall thickening. The urinary bladder is midline in location. Stomach/Bowel: There is no appreciable bowel wall or mesenteric thickening. No evident bowel obstruction. No free air or portal venous air. Vascular/Lymphatic: There is atherosclerotic calcification in the aorta and common iliac arteries. No aneurysm evident. Major mesenteric vessels appear patent on this noncontrast enhanced study. There are scattered subcentimeter mesenteric lymph nodes in the mid abdomen,  overall slightly smaller than on the previous study from 2016. No new lymph node prominence is seen elsewhere in the abdomen or pelvis. Reproductive: There are multiple prostatic calculi. Prostate and seminal vesicles appear normal in size and contour. No evident pelvic mass. Other: Appendix absent. There remains mesenteric stranding and thickening throughout the mid abdomen, similar to the 2016 study. Multiple small lymph nodes remain in this area with less lymph node prominence in this area compared to the 2016 study. There has been no progression of mesenteric stranding/thickening since the 2016 study. No abscess or ascites is evident in the abdomen or pelvis. Musculoskeletal: There is degenerative change in the lumbar spine and in both hip joints. There are no blastic or lytic bone lesions. There is no intramuscular or abdominal wall lesion. IMPRESSION: 1. Slight hydronephrosis on the right. No renal or ureteral calculus evident a 1 mm calculus is noted within the urinary bladder slightly to the left of midline posteriorly. Question recent calculus passage. It should be noted that a degree of pyelonephritis on the right could present in this manner. Note that there is no perinephric stranding or evidence of renal abscess on the right. Left kidney appears  normal. 2. Stable 1.4 x 1.3 cm partially cystic mass arising from the tail the pancreas. No new pancreatic lesion evident. Stability suggests benign etiology. A slow growing neoplasm is possible. Given this circumstance in the patient's age, it may be reasonable to consider a follow-up study in approximately 2 years to assess for stability. 3. Persistent mid abdominal mesenteric thickening/sclerosis with several small lymph nodes in this area. There is less lymph node prominence in this area compared to the 2016 study. 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 The lack of progression of sclerosis in the mid abdomen and diminution of lymph node prominence in  this area suggests that this area does not have neoplastic etiology. No adenopathy elsewhere. 4. Stable fairly small splenic artery aneurysm with peripheral wall calcification. 5.  No bowel obstruction.  No abscess.  Appendix absent. 6. Aortoiliac atherosclerosis. There are foci of coronary artery calcification. 7.  Gallbladder absent. Aortic Atherosclerosis (ICD10-I70.0). Electronically Signed   By: Bretta Bang III M.D.   On: 02/01/2018 12:39    Procedures Procedures (including critical care time)  Medications Ordered in ED Medications  ondansetron (ZOFRAN-ODT) disintegrating tablet 4 mg (4 mg Oral Given 02/01/18 0404)  oxyCODONE-acetaminophen (PERCOCET/ROXICET) 5-325 MG per tablet 1 tablet (1 tablet Oral Given 02/01/18 0405)     Initial Impression / Assessment and Plan / ED Course  I have reviewed the triage vital signs and the nursing notes.  Pertinent labs & imaging results that were available during my care of the patient were reviewed by me and considered in my medical decision making (see chart for details).     Of severe sharp pain in his right side of the abdomen and right flank.  History of kidney stone several years ago.  Unsure if this feels similar.  Urine analysis obtained in triage shows large hemoglobin and too numerous to count red blood cells.  No signs of infection.  Concerning for kidney stone.  Will get CT renal, labs.  Patient currently in no acute distress and has no pain or abdominal tenderness.  Patient CT scan shows 1 mm stone in the bladder with mild residual hydronephrosis, otherwise no acute findings.  Discussed all results with patient and his wife.  Patient is pain-free.  He is in no distress.  Urine shows no signs of infection.  Plan to discharge home, will have him follow-up with family doctor.  Return precautions discussed.  Vitals:   02/01/18 1100 02/01/18 1115 02/01/18 1130 02/01/18 1200  BP: 138/71 (!) 166/73 137/68 (!) 152/69  Pulse:  (!) 59 (!) 48  (!) 52  Resp:      Temp:      TempSrc:      SpO2:  100% 100% 100%     .  Final Clinical Impressions(s) / ED Diagnoses   Final diagnoses:  Nephrolithiasis    ED Discharge Orders    None       Jaynie Crumble, PA-C 02/01/18 1528    Gwyneth Sprout, MD 02/01/18 1558

## 2018-02-01 NOTE — ED Notes (Signed)
Pt given narcotic pain medicine in triage. Advised of side effects and instructed to avoid driving for a minimum of four hours.  

## 2018-05-10 ENCOUNTER — Other Ambulatory Visit: Payer: Self-pay | Admitting: Internal Medicine

## 2018-05-10 DIAGNOSIS — N19 Unspecified kidney failure: Secondary | ICD-10-CM

## 2018-05-15 ENCOUNTER — Ambulatory Visit
Admission: RE | Admit: 2018-05-15 | Discharge: 2018-05-15 | Disposition: A | Discharge status: Home / Self Care | Payer: Medicare Other | Source: Ambulatory Visit | Attending: Internal Medicine | Admitting: Internal Medicine

## 2018-05-15 DIAGNOSIS — N19 Unspecified kidney failure: Secondary | ICD-10-CM

## 2018-08-15 ENCOUNTER — Other Ambulatory Visit: Payer: Self-pay | Admitting: Ophthalmology

## 2019-01-05 IMAGING — CT CT RENAL STONE PROTOCOL
2 of 4 series · 14 of 46 positions shown, 16 images · non-contrast
Comparison: July 31, 2015

CLINICAL DATA: Right flank pain

EXAM:
CT ABDOMEN AND PELVIS WITHOUT CONTRAST
TECHNIQUE: Multidetector CT imaging of the abdomen and pelvis was performed
following the standard protocol without oral or intravenous
contrast.

[Series 3: ap without · axial · non-contrast · 0.78mm/px · z∈[+817,+1287]mm · 11 of 106 slices shown, 13 images]
[im 6/106  soft-tissue]
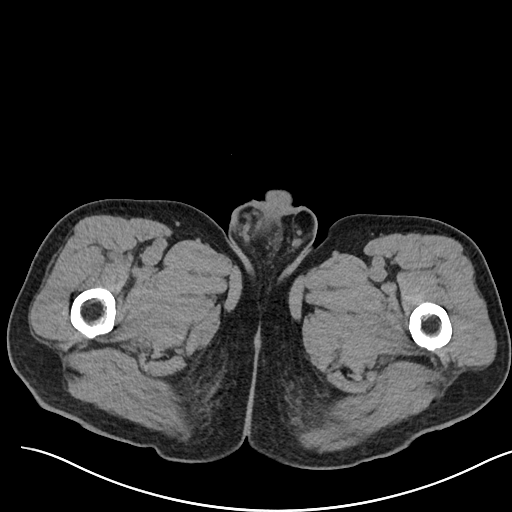
[im 6/106  bone]
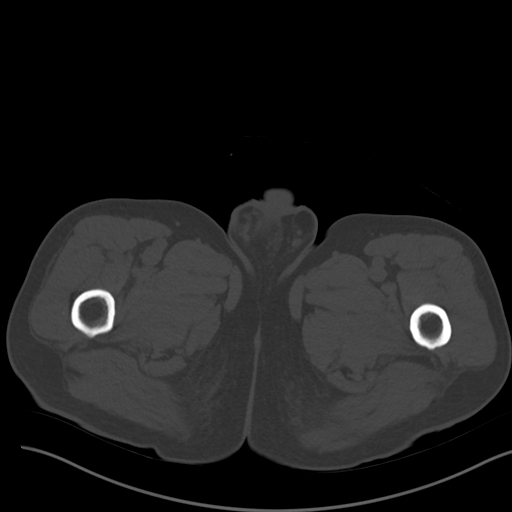
[im 18/106  soft-tissue]
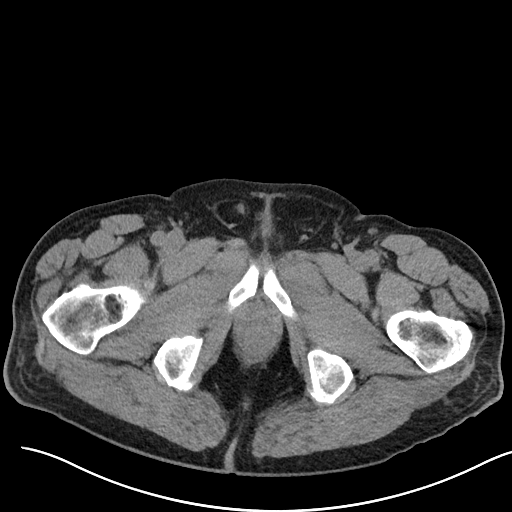
[im 24/106  soft-tissue]
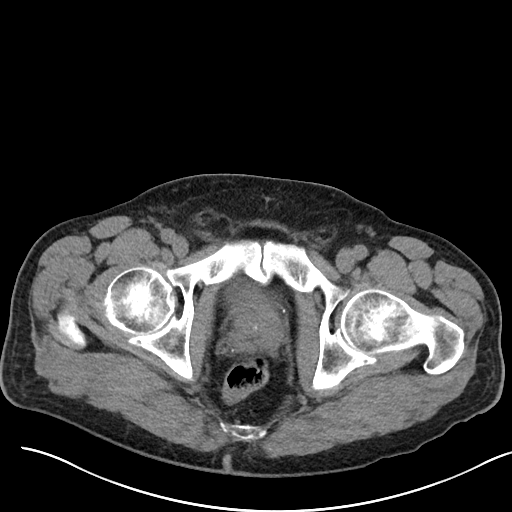
[im 36/106  soft-tissue]
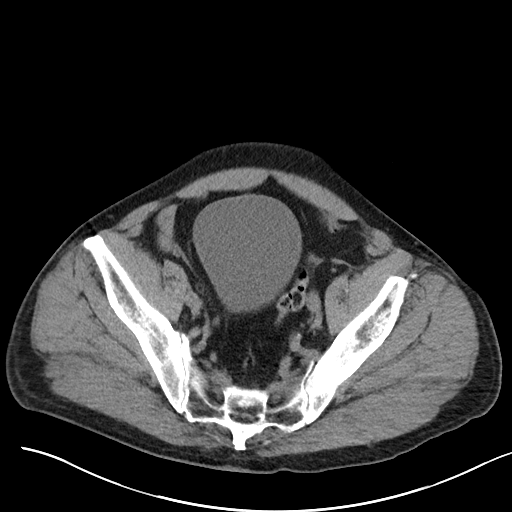
[im 41/106  soft-tissue]
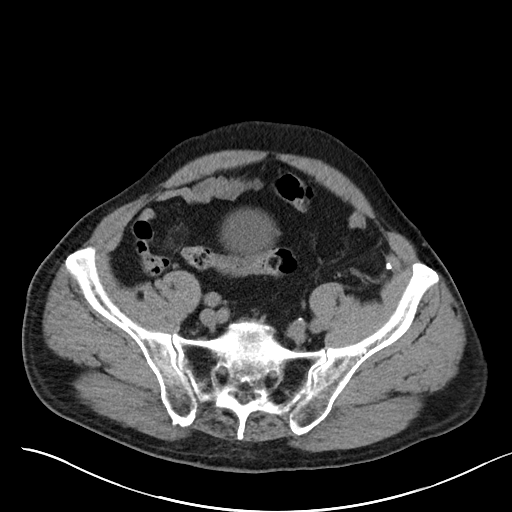
[im 53/106  soft-tissue]
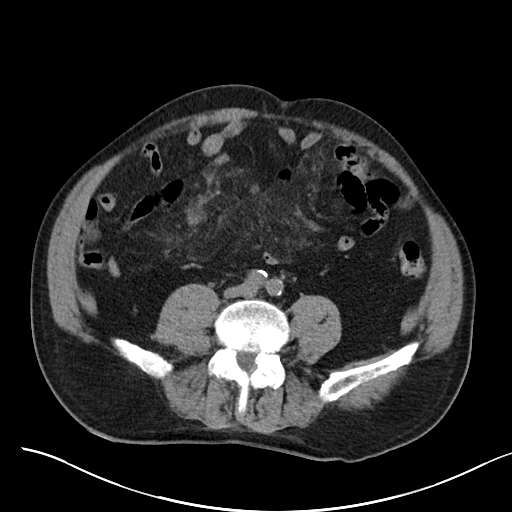
[im 65/106  soft-tissue]
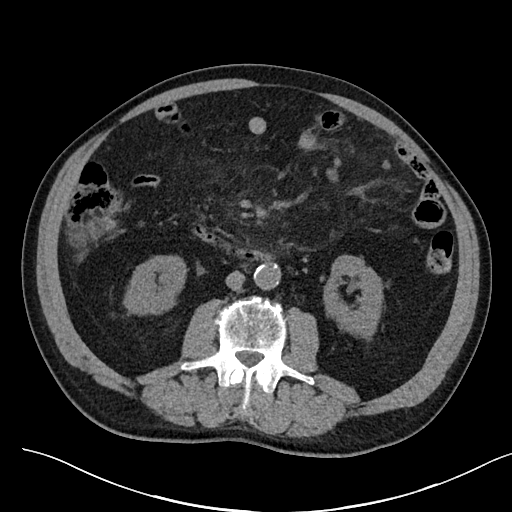
[im 71/106  soft-tissue]
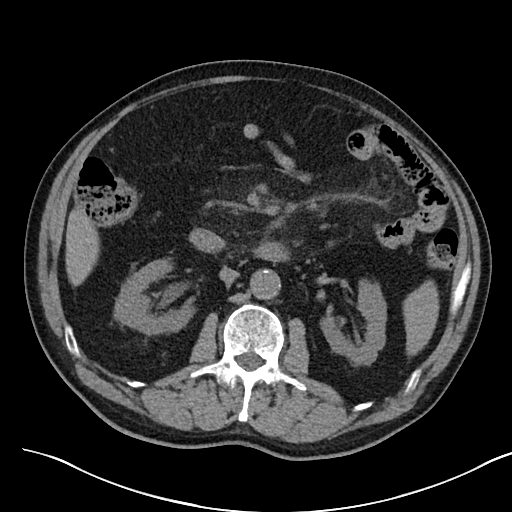
[im 82/106  soft-tissue]
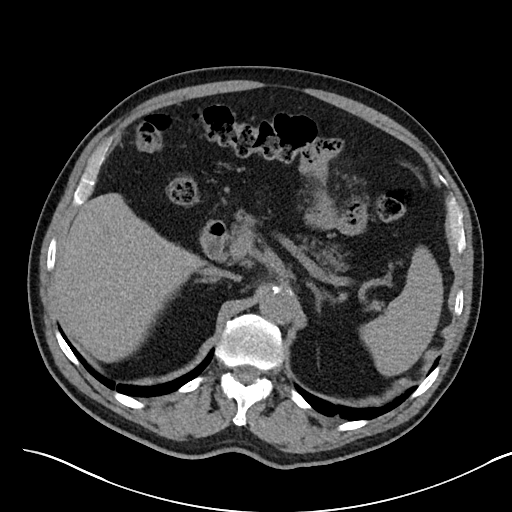
[im 82/106  bone]
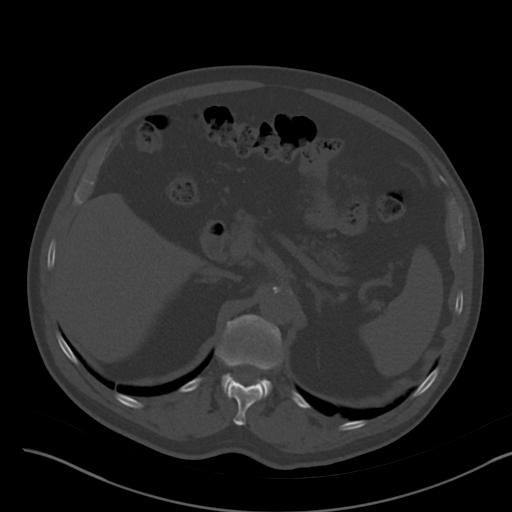
[im 88/106  soft-tissue]
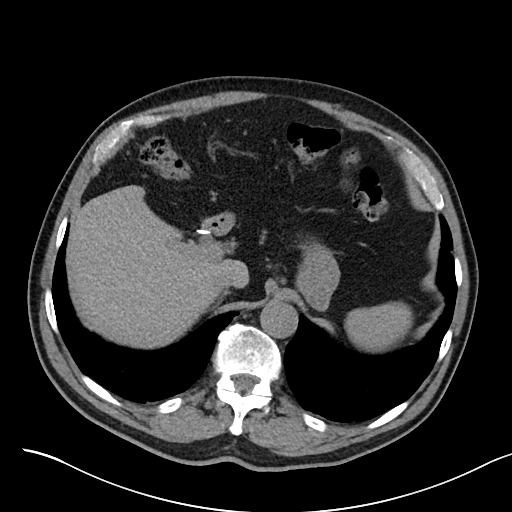
[im 100/106  soft-tissue]
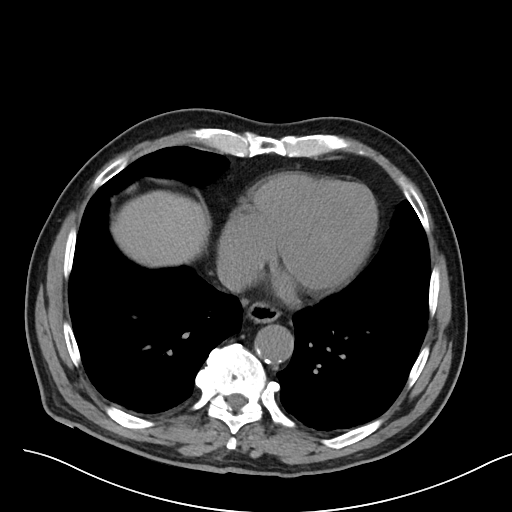

[Series 6: cor · coronal · 0.82mm/px · 3 of 106 slices shown]
[im 36/106  soft-tissue]
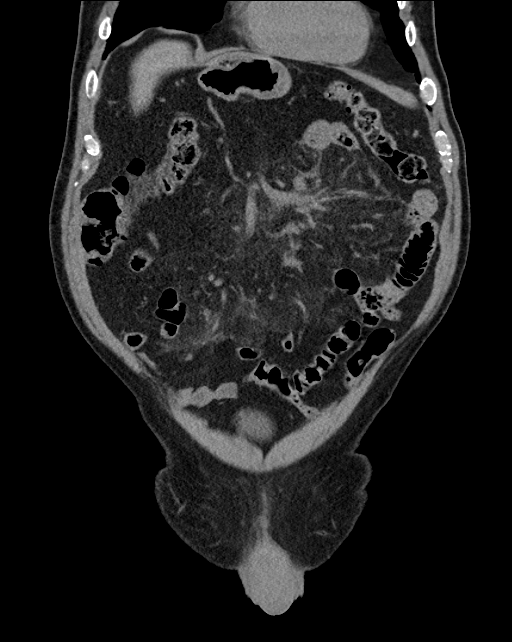
[im 47/106  soft-tissue]
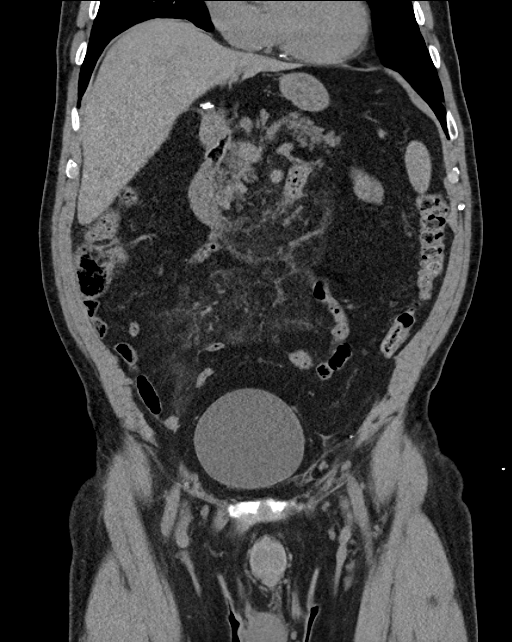
[im 59/106  soft-tissue]
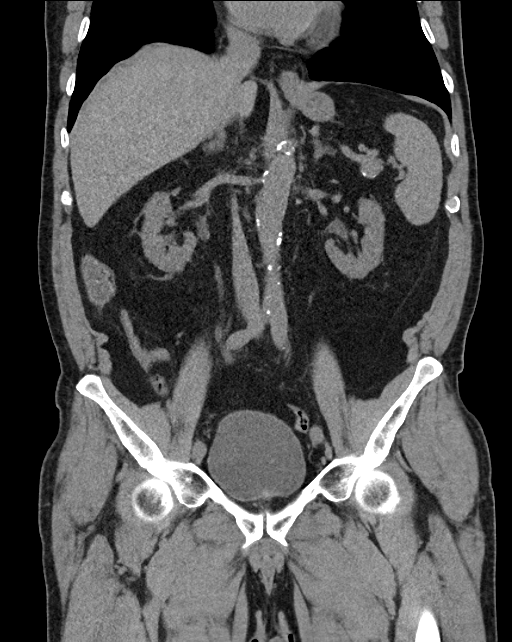

[14 of 46 positions shown; findings below may reference images not displayed]

FINDINGS: Lower chest: There is mild bibasilar lung scarring. There are foci
of coronary artery calcification.

Hepatobiliary: No focal liver lesions are apparent on this
noncontrast enhanced study. Gallbladder is absent. There is no
biliary duct dilatation.

Pancreas: There is a stable partially cystic area in the tail of the
pancreas measuring 1.4 x 1.3 cm. No new pancreatic lesion evident.
No pancreatic duct dilatation or inflammation.

Spleen: No splenic lesions evident. There is a stable partially
calcified splenic artery aneurysm measuring 1.5 x 1.1 cm.

Adrenals/Urinary Tract: Adrenals appear normal bilaterally. There is
no appreciable renal mass on either side. There is mild
hydronephrosis on the right. There is no hydronephrosis on the left.
There is no appreciable renal calculus on either side. There is no
evident ureteral calculus on either side. There is a 1 mm calculus
along the posterior aspect of the urinary bladder slightly to the
left of midline. There is no urinary bladder wall thickening. The
urinary bladder is midline in location.

Stomach/Bowel: There is no appreciable bowel wall or mesenteric
thickening. No evident bowel obstruction. No free air or portal
venous air.

Vascular/Lymphatic: There is atherosclerotic calcification in the
aorta and common iliac arteries. No aneurysm evident. Major
mesenteric vessels appear patent on this noncontrast enhanced study.

There are scattered subcentimeter mesenteric lymph nodes in the mid
abdomen, overall slightly smaller than on the previous study from
7392. No new lymph node prominence is seen elsewhere in the abdomen
or pelvis.

Reproductive: There are multiple prostatic calculi. Prostate and
seminal vesicles appear normal in size and contour. No evident
pelvic mass.

Other: Appendix absent. There remains mesenteric stranding and
thickening throughout the mid abdomen, similar to the 7392 study.
Multiple small lymph nodes remain in this area with less lymph node
prominence in this area compared to the 7392 study. There has been
no progression of mesenteric stranding/thickening since the 7392
study.

No abscess or ascites is evident in the abdomen or pelvis.

Musculoskeletal: There is degenerative change in the lumbar spine
and in both hip joints. There are no blastic or lytic bone lesions.
There is no intramuscular or abdominal wall lesion.
IMPRESSION: 1. Slight hydronephrosis on the right. No renal or ureteral calculus
evident a 1 mm calculus is noted within the urinary bladder slightly
to the left of midline posteriorly. Question recent calculus
passage. It should be noted that a degree of pyelonephritis on the
right could present in this manner. Note that there is no
perinephric stranding or evidence of renal abscess on the right.
Left kidney appears normal.

2. Stable 1.4 x 1.3 cm partially cystic mass arising from the tail
the pancreas. No new pancreatic lesion evident. Stability suggests
benign etiology. A slow growing neoplasm is possible. Given this
circumstance in the patient's age, it may be reasonable to consider
a follow-up study in approximately 2 years to assess for stability.

3. Persistent mid abdominal mesenteric thickening/sclerosis with
several small lymph nodes in this area. There is less lymph node
prominence in this area compared to the 7392 study. Suspect residua
of sclerosing mesenteritis. The lack of progression of sclerosis in
the mid abdomen and diminution of lymph node prominence in this area
suggests that this area does not have neoplastic etiology. No
adenopathy elsewhere.

4. Stable fairly small splenic artery aneurysm with peripheral wall
calcification.

5.  No bowel obstruction.  No abscess.  Appendix absent.

6. Aortoiliac atherosclerosis. There are foci of coronary artery
calcification.

7.  Gallbladder absent.

Aortic Atherosclerosis ([id number]).

## 2019-09-11 ENCOUNTER — Telehealth: Payer: Self-pay | Admitting: Neurology

## 2019-09-11 NOTE — Telephone Encounter (Signed)
I contacted the Kyle [name]'s wife. She requested an appointment to be made for husband to be evaluated for worsening memory. I was able to schedule the Kyle [name] for an appointment with Dr. Jannifer Franklin on 09/25/2019 at 12 pm check in time of 11:30.

## 2019-09-11 NOTE — Telephone Encounter (Signed)
Pt is calling wanting to speak to the Provider or the RN to discuss her husbands dementia and him getting out of control. Please advise.

## 2019-09-25 ENCOUNTER — Encounter: Payer: Self-pay | Admitting: Neurology

## 2019-09-25 ENCOUNTER — Other Ambulatory Visit: Payer: Self-pay

## 2019-09-25 ENCOUNTER — Ambulatory Visit (INDEPENDENT_AMBULATORY_CARE_PROVIDER_SITE_OTHER): Payer: Medicare Other | Admitting: Neurology

## 2019-09-25 VITALS — BP 173/86 | HR 63 | Temp 98.0°F | Ht 72.0 in | Wt 197.3 lb

## 2019-09-25 DIAGNOSIS — F0280 Dementia in other diseases classified elsewhere without behavioral disturbance: Secondary | ICD-10-CM | POA: Diagnosis not present

## 2019-09-25 DIAGNOSIS — G301 Alzheimer's disease with late onset: Secondary | ICD-10-CM | POA: Diagnosis not present

## 2019-09-25 DIAGNOSIS — R413 Other amnesia: Secondary | ICD-10-CM | POA: Diagnosis not present

## 2019-09-25 HISTORY — DX: Dementia in other diseases classified elsewhere, unspecified severity, without behavioral disturbance, psychotic disturbance, mood disturbance, and anxiety: F02.80

## 2019-09-25 MED ORDER — RIVASTIGMINE 4.6 MG/24HR TD PT24: 4.6000 mg | MEDICATED_PATCH | Freq: Every day | TRANSDERMAL | 6 refills | Status: DC

## 2019-09-25 NOTE — Patient Instructions (Signed)
We will start Exelon 4.6 mg daily.

## 2019-09-25 NOTE — Progress Notes (Addendum)
Reason for visit: Dementia  Kyle [name] is an 83 y.o. male  History of present illness:  Kyle [name] is an 83 year old right-handed white male with a history of a progressive dementing illness.  The patient was seen almost 2 years ago, at that time he was scoring 12/30 on a Mini-Mental status examination.  He still lives with his wife, he is able to bathe and dress himself, but otherwise he needs help with other activities of daily living.  The patient apparently is still driving a car some with his wife in the car, the wife claims that he does quite well.  He tries driving on his own, sometimes he cannot figure out how to start the car.  The patient occasionally will have events of agitation in the evening, but this is rare.  The patient has a lot of difficulty with word finding, he is oftentimes not able to communicate with logical fluent sentences.  In the past, he was tried on Aricept but this resulted in too much diarrhea and he had to stop the medication.  He remains on Namenda.  He returns to this office for an evaluation.  Past Medical History:  Diagnosis Date  . Alzheimer disease ([place]) 09/25/2019    Past Surgical History:  Procedure Laterality Date  . APPENDECTOMY    . BACK SURGERY    . CHOLECYSTECTOMY    . HERNIA REPAIR      Family History  Problem Relation Age of Onset  . Pneumonia Mother   . Kidney failure Father     Social history:  reports that he has never smoked. He has never used smokeless tobacco. He reports that he does not drink alcohol or use drugs.    Allergies  Allergen Reactions  . Demerol [Meperidine] Nausea And Vomiting    Medications:  Prior to Admission medications   Medication Sig Start Date End Date Taking? Authorizing Provider  MEGARED OMEGA-3 KRILL OIL 500 MG CAPS Take 500 mg by mouth daily.   Yes [provider]  memantine (NAMENDA) 10 MG tablet Take 10 mg by mouth 2 (two) times daily.   Yes [provider]    ROS:  Out of a complete 14 system review of symptoms, the patient complains only of the following symptoms, and all other reviewed systems are negative.  Memory disturbance, dementia Word finding problems  Blood pressure (!) 173/86, pulse 63, temperature 98 F (36.7 C), temperature source Temporal, height 6' (1.829 m), weight 197 lb 5 oz (89.5 kg).  Physical Exam  General: The patient is alert and cooperative at the time of the examination.  Skin: No significant peripheral edema is noted.   Neurologic Exam  Mental status: The patient is alert and oriented x 1 at the time of the examination (not oriented to place or date). The Mini-Mental status examination done today shows a total score of 6/30.   Cranial nerves: Facial symmetry is present. Speech is fragmented, aphasic, nonfluent, not dysarthric. Extraocular movements are full. Visual fields are full.  Motor: The patient has good strength in all 4 extremities.  Sensory examination: Soft touch sensation is symmetric on the face, arms, and legs.  Coordination: The patient has good finger-nose-finger and heel-to-shin bilaterally.  Very minimal apraxia was seen.  Gait and station: The patient has a normal gait.  Romberg is negative. No drift is seen.  Reflexes: Deep tendon reflexes are symmetric.   Assessment/Plan:  1.  Alzheimer's disease  The patient has had significant  progression of cognitive abilities since last seen.  He should not be operating a motor vehicle at this time.  He will be given a trial on the Exelon patch, if diarrhea or stomach cramps occur, the medication will need to be discontinued.  He will otherwise follow-up in 6 months.  I have asked the wife to plan ahead for the future, the patient will require increasing levels of assistance as time goes on.  Marlan Palau MD 09/25/2019 12:30 PM  [facility] [address] [place], [place] [address]  Phone [phone] Fax  [phone]

## 2019-09-27 ENCOUNTER — Telehealth: Payer: Self-pay

## 2019-09-27 NOTE — Telephone Encounter (Signed)
(  Key: [id number])  This request has received a Favorable outcome.  Please note any additional information provided by [facility] at the bottom of your screen.  You will also receive a faxed copy of the determination.  approved through 12/26/2020

## 2019-09-27 NOTE — Telephone Encounter (Signed)
PA for Rivastigmine 4.5 mg/24 hour patch has been submitted to [facility] via cover my meds.   (Key: [id number])  Your information has been sent to [facility].

## 2020-01-07 ENCOUNTER — Inpatient Hospital Stay (HOSPITAL_COMMUNITY)
Admission: EM | Admit: 2020-01-07 | Discharge: 2020-01-15 | DRG: 065 | Disposition: A | Discharge status: Skilled Nursing Facility | Payer: Medicare Other | Attending: Internal Medicine | Admitting: Internal Medicine

## 2020-01-07 ENCOUNTER — Emergency Department (HOSPITAL_COMMUNITY): Payer: Medicare Other

## 2020-01-07 ENCOUNTER — Encounter (HOSPITAL_COMMUNITY): Payer: Self-pay

## 2020-01-07 DIAGNOSIS — Z9049 Acquired absence of other specified parts of digestive tract: Secondary | ICD-10-CM

## 2020-01-07 DIAGNOSIS — G309 Alzheimer's disease, unspecified: Secondary | ICD-10-CM | POA: Diagnosis present

## 2020-01-07 DIAGNOSIS — I639 Cerebral infarction, unspecified: Secondary | ICD-10-CM | POA: Diagnosis not present

## 2020-01-07 DIAGNOSIS — Z20822 Contact with and (suspected) exposure to covid-19: Secondary | ICD-10-CM | POA: Diagnosis present

## 2020-01-07 DIAGNOSIS — M19072 Primary osteoarthritis, left ankle and foot: Secondary | ICD-10-CM | POA: Diagnosis present

## 2020-01-07 DIAGNOSIS — Z885 Allergy status to narcotic agent status: Secondary | ICD-10-CM

## 2020-01-07 DIAGNOSIS — I63421 Cerebral infarction due to embolism of right anterior cerebral artery: Secondary | ICD-10-CM | POA: Diagnosis not present

## 2020-01-07 DIAGNOSIS — Z66 Do not resuscitate: Secondary | ICD-10-CM | POA: Diagnosis present

## 2020-01-07 DIAGNOSIS — R17 Unspecified jaundice: Secondary | ICD-10-CM

## 2020-01-07 DIAGNOSIS — R1310 Dysphagia, unspecified: Secondary | ICD-10-CM | POA: Diagnosis present

## 2020-01-07 DIAGNOSIS — Z6826 Body mass index (BMI) 26.0-26.9, adult: Secondary | ICD-10-CM

## 2020-01-07 DIAGNOSIS — Z9181 History of falling: Secondary | ICD-10-CM

## 2020-01-07 DIAGNOSIS — G8194 Hemiplegia, unspecified affecting left nondominant side: Secondary | ICD-10-CM | POA: Diagnosis present

## 2020-01-07 DIAGNOSIS — I82441 Acute embolism and thrombosis of right tibial vein: Secondary | ICD-10-CM | POA: Diagnosis present

## 2020-01-07 DIAGNOSIS — R2242 Localized swelling, mass and lump, left lower limb: Secondary | ICD-10-CM

## 2020-01-07 DIAGNOSIS — Z8673 Personal history of transient ischemic attack (TIA), and cerebral infarction without residual deficits: Secondary | ICD-10-CM | POA: Diagnosis present

## 2020-01-07 DIAGNOSIS — R627 Adult failure to thrive: Secondary | ICD-10-CM | POA: Diagnosis present

## 2020-01-07 DIAGNOSIS — Z79899 Other long term (current) drug therapy: Secondary | ICD-10-CM

## 2020-01-07 DIAGNOSIS — F0280 Dementia in other diseases classified elsewhere without behavioral disturbance: Secondary | ICD-10-CM | POA: Diagnosis present

## 2020-01-07 DIAGNOSIS — R29718 NIHSS score 18: Secondary | ICD-10-CM | POA: Diagnosis present

## 2020-01-07 DIAGNOSIS — E785 Hyperlipidemia, unspecified: Secondary | ICD-10-CM | POA: Diagnosis present

## 2020-01-07 DIAGNOSIS — E669 Obesity, unspecified: Secondary | ICD-10-CM | POA: Diagnosis present

## 2020-01-07 DIAGNOSIS — R509 Fever, unspecified: Secondary | ICD-10-CM | POA: Diagnosis not present

## 2020-01-07 LAB — URINALYSIS, ROUTINE W REFLEX MICROSCOPIC
Bilirubin Urine: NEGATIVE
Glucose, UA: NEGATIVE mg/dL
Hgb urine dipstick: NEGATIVE
Ketones, ur: NEGATIVE mg/dL
Leukocytes,Ua: NEGATIVE
Nitrite: NEGATIVE
Protein, ur: NEGATIVE mg/dL
Specific Gravity, Urine: 1.018 (ref 1.005–1.030)
pH: 6 (ref 5.0–8.0)

## 2020-01-07 LAB — CBC WITH DIFFERENTIAL/PLATELET
Abs Immature Granulocytes: 0.06 10*3/uL (ref 0.00–0.07)
Basophils Absolute: 0 10*3/uL (ref 0.0–0.1)
Basophils Relative: 0 %
Eosinophils Absolute: 0.1 10*3/uL (ref 0.0–0.5)
Eosinophils Relative: 1 %
HCT: 45.4 % (ref 39.0–52.0)
Hemoglobin: 14.8 g/dL (ref 13.0–17.0)
Immature Granulocytes: 1 %
Lymphocytes Relative: 13 %
Lymphs Abs: 1.3 10*3/uL (ref 0.7–4.0)
MCH: 30.3 pg (ref 26.0–34.0)
MCHC: 32.6 g/dL (ref 30.0–36.0)
MCV: 93 fL (ref 80.0–100.0)
Monocytes Absolute: 1.1 10*3/uL — ABNORMAL HIGH (ref 0.1–1.0)
Monocytes Relative: 10 %
Neutro Abs: 7.8 10*3/uL — ABNORMAL HIGH (ref 1.7–7.7)
Neutrophils Relative %: 75 %
Platelets: 151 10*3/uL (ref 150–400)
RBC: 4.88 MIL/uL (ref 4.22–5.81)
RDW: 13.1 % (ref 11.5–15.5)
WBC: 10.4 10*3/uL (ref 4.0–10.5)
nRBC: 0 % (ref 0.0–0.2)

## 2020-01-07 LAB — POCT I-STAT EG7
Acid-Base Excess: 3 mmol/L — ABNORMAL HIGH (ref 0.0–2.0)
Bicarbonate: 29.1 mmol/L — ABNORMAL HIGH (ref 20.0–28.0)
Calcium, Ion: 1.11 mmol/L — ABNORMAL LOW (ref 1.15–1.40)
HCT: 43 % (ref 39.0–52.0)
Hemoglobin: 14.6 g/dL (ref 13.0–17.0)
O2 Saturation: 45 %
Potassium: 3.9 mmol/L (ref 3.5–5.1)
Sodium: 139 mmol/L (ref 135–145)
TCO2: 31 mmol/L (ref 22–32)
pCO2, Ven: 47.9 mmHg (ref 44.0–60.0)
pH, Ven: 7.392 (ref 7.250–7.430)
pO2, Ven: 25 mmHg — CL (ref 32.0–45.0)

## 2020-01-07 LAB — COMPREHENSIVE METABOLIC PANEL
ALT: 24 U/L (ref 0–44)
AST: 20 U/L (ref 15–41)
Albumin: 3.2 g/dL — ABNORMAL LOW (ref 3.5–5.0)
Alkaline Phosphatase: 74 U/L (ref 38–126)
Anion gap: 8 (ref 5–15)
BUN: 17 mg/dL (ref 8–23)
CO2: 26 mmol/L (ref 22–32)
Calcium: 8.3 mg/dL — ABNORMAL LOW (ref 8.9–10.3)
Chloride: 103 mmol/L (ref 98–111)
Creatinine, Ser: 1.4 mg/dL — ABNORMAL HIGH (ref 0.61–1.24)
GFR calc Af Amer: 54 mL/min — ABNORMAL LOW (ref >60)
GFR calc non Af Amer: 46 mL/min — ABNORMAL LOW (ref >60)
Glucose, Bld: 123 mg/dL — ABNORMAL HIGH (ref 70–99)
Potassium: 3.9 mmol/L (ref 3.5–5.1)
Sodium: 137 mmol/L (ref 135–145)
Total Bilirubin: 1.9 mg/dL — ABNORMAL HIGH (ref 0.3–1.2)
Total Protein: 6.3 g/dL — ABNORMAL LOW (ref 6.5–8.1)

## 2020-01-07 LAB — SARS CORONAVIRUS 2 (TAT 6-24 HRS): SARS Coronavirus 2: NEGATIVE

## 2020-01-07 LAB — CBG MONITORING, ED: Glucose-Capillary: 92 mg/dL (ref 70–99)

## 2020-01-07 MED ORDER — LORAZEPAM 2 MG/ML IJ SOLN: 1.0000 mg | INTRAMUSCULAR | Status: DC | PRN

## 2020-01-07 MED ORDER — TUBERCULIN PPD 5 UNIT/0.1ML ID SOLN
5.0000 [IU] | INTRADERMAL | Status: DC
  Filled 2020-01-07: qty 0.1

## 2020-01-07 MED ORDER — SODIUM CHLORIDE 0.9 % IV BOLUS
500.0000 mL | Freq: Once | INTRAVENOUS | Status: AC
  Administered 2020-01-07: 500 mL via INTRAVENOUS

## 2020-01-07 MED ORDER — SODIUM CHLORIDE 0.9 % IV SOLN: INTRAVENOUS | Status: DC

## 2020-01-07 NOTE — ED Notes (Signed)
Patient transported to MRI 

## 2020-01-07 NOTE — ED Notes (Signed)
Pt resting at the moment. Wife is at bedside. It feels warm in the room. This tech notices that the heat has been on so turns the heat off. Pt's temp is 100.1

## 2020-01-07 NOTE — ED Triage Notes (Signed)
Pt BIB [facility] with L. Sided Weakness x 1 week. Pt presented Saturday with paralysis of left leg and worsening weakness of left side.   Hx of advanced Dementia (Alzheimers)  No Hx of Stroke  Baseline - cannot answer questions or follow commands well. Word Salad also baseline.   EMS VS:  170/100 BP 125 CBG 97.9 T 64 HR 100% RA NSR on Monitor   Family OTW as support.

## 2020-01-07 NOTE — ED Provider Notes (Signed)
[facility] EMERGENCY DEPARTMENT Provider Note   CSN: [id number] Arrival date & time: 01/07/20  1612     History Chief Complaint  Patient presents with  . Left-Sided Weakness/Paralysis    Kyle [name] is a 84 y.o. male.  HPI   He is here for evaluation of weakness in the left leg which started at least 1 week ago.  He has had generally worse weakness, for couple of weeks.  In the last few days was only able to get around in a wheelchair but has been mostly in bed.  He has had a couple of falls when he is walking unassisted in the last week without serious injury.  He was transferred here today by EMS for evaluation.  He has been eating well according to his wife.  She gives all of the history.  He is unable to give any history.  He has had a mild nonproductive cough.  He has not been vomiting or had a fever.  There is no known Covid exposure.  Patient remains able to communicate but confused as usual according to his wife.  There are no other known modifying factors.     Past Medical History:  Diagnosis Date  . Alzheimer disease ([place]) 09/25/2019    Patient Active Problem List   Diagnosis Date Noted  . Alzheimer disease ([place]) 09/25/2019  . Memory difficulty 04/01/2017    Past Surgical History:  Procedure Laterality Date  . APPENDECTOMY    . BACK SURGERY    . CHOLECYSTECTOMY    . HERNIA REPAIR         Family History  Problem Relation Age of Onset  . Pneumonia Mother   . Kidney failure Father     Social History   Tobacco Use  . Smoking status: Never Smoker  . Smokeless tobacco: Never Used  Substance Use Topics  . Alcohol use: No  . Drug use: No    Home Medications Prior to Admission medications   Medication Sig Start Date End Date Taking? Authorizing Provider  memantine (NAMENDA) 10 MG tablet Take 10 mg by mouth 2 (two) times daily.   Yes [provider]  rivastigmine (EXELON) 4.6 mg/24hr Place 1 patch (4.6 mg total) onto the  skin daily. 09/25/19  Yes Kathrynn Ducking, MD    Allergies    Demerol [meperidine]  Review of Systems   Review of Systems  All other systems reviewed and are negative.   Physical Exam Updated Vital Signs BP (!) 162/88   Pulse 74   Temp 97.6 F (36.4 C) (Oral)   Resp 17   Physical Exam Vitals and nursing note reviewed.  Constitutional:      General: He is not in acute distress.    Appearance: He is well-developed. He is obese. He is ill-appearing. He is not toxic-appearing or diaphoretic.  HENT:     Head: Normocephalic and atraumatic.     Right Ear: External ear normal.     Left Ear: External ear normal.     Nose: No congestion or rhinorrhea.     Mouth/Throat:     Mouth: Mucous membranes are dry.     Pharynx: No oropharyngeal exudate or posterior oropharyngeal erythema.  Eyes:     Conjunctiva/sclera: Conjunctivae normal.     Pupils: Pupils are equal, round, and reactive to light.  Neck:     Trachea: Phonation normal.  Cardiovascular:     Rate and Rhythm: Normal rate and regular rhythm.  Heart sounds: Normal heart sounds.  Pulmonary:     Effort: Pulmonary effort is normal.     Breath sounds: Normal breath sounds.  Abdominal:     Palpations: Abdomen is soft.     Tenderness: There is no abdominal tenderness.  Musculoskeletal:        General: Normal range of motion.     Cervical back: Normal range of motion and neck supple.     Comments: Left arm weaker than right.  He needs assistance to hold his arms up above his shoulders.  He cannot elevate either leg off the stretcher.  He has normal tone in his arms and legs.  Skin:    General: Skin is warm and dry.  Neurological:     Mental Status: He is alert.     Cranial Nerves: No cranial nerve deficit.     Motor: No abnormal muscle tone.     Coordination: Coordination normal.     Comments: No dysarthria or aphasia.  He is confused.  He follows commands poorly.  Psychiatric:        Mood and Affect: Mood normal.          Behavior: Behavior normal.     ED Results / Procedures / Treatments   Labs (all labs ordered are listed, but only abnormal results are displayed) Labs Reviewed  COMPREHENSIVE METABOLIC PANEL - Abnormal; Notable for the following components:      Result Value   Glucose, Bld 123 (*)    Creatinine, Ser 1.40 (*)    Calcium 8.3 (*)    Total Protein 6.3 (*)    Albumin 3.2 (*)    Total Bilirubin 1.9 (*)    GFR calc non Af Amer 46 (*)    GFR calc Af Amer 54 (*)    All other components within normal limits  CBC WITH DIFFERENTIAL/PLATELET - Abnormal; Notable for the following components:   Neutro Abs 7.8 (*)    Monocytes Absolute 1.1 (*)    All other components within normal limits  POCT I-STAT EG7 - Abnormal; Notable for the following components:   pO2, Ven 25.0 (*)    Bicarbonate 29.1 (*)    Acid-Base Excess 3.0 (*)    Calcium, Ion 1.11 (*)    All other components within normal limits  SARS CORONAVIRUS 2 (TAT 6-24 HRS)  URINALYSIS, ROUTINE W REFLEX MICROSCOPIC  BLOOD GAS, VENOUS  CBG MONITORING, ED    EKG None  Radiology DG Chest 1 View  Result Date: 01/07/2020 CLINICAL DATA:  Left side weakness EXAM: CHEST  1 VIEW COMPARISON:  10/08/2009 FINDINGS: Cardiomegaly. No confluent opacities, effusions or edema. No acute bony abnormality. IMPRESSION: Cardiomegaly.  No active disease. Electronically Signed   By: Charlett Nose M.D.   On: 01/07/2020 17:50   DG Abdomen 1 View  Result Date: 01/07/2020 CLINICAL DATA:  Cough EXAM: ABDOMEN - 1 VIEW COMPARISON:  07/15/2014 FINDINGS: Prior cholecystectomy. Nonobstructive bowel gas pattern. No organomegaly or free air. IMPRESSION: No acute findings. Electronically Signed   By: Charlett Nose M.D.   On: 01/07/2020 17:50   CT Head Wo Contrast  Result Date: 01/07/2020 CLINICAL DATA:  84 year old male with history of Alzheimer's and multiple falls. EXAM: CT HEAD WITHOUT CONTRAST TECHNIQUE: Contiguous axial images were obtained from the base of  the skull through the vertex without intravenous contrast. COMPARISON:  None. FINDINGS: Brain: There is moderate age-related atrophy and chronic microvascular ischemic changes. Bilateral frontal hypodensities with loss of gray-white matter discrimination (series  2, image 23 and 25), likely chronic. There is no acute intracranial hemorrhage. No mass effect or midline shift. No extra-axial fluid collection. Vascular: No hyperdense vessel or unexpected calcification. Skull: Normal. Negative for fracture or focal lesion. Sinuses/Orbits: Mild mucoperiosteal thickening of paranasal sinuses. No air-fluid level. The mastoid air cells are clear. Other: None IMPRESSION: 1. No acute intracranial hemorrhage. 2. Moderate age-related atrophy and chronic microvascular ischemic changes. Multifocal hypodensities with loss of gray-white matter discrimination, likely chronic. If there is high clinical concern for acute infarct further evaluation with MRI is recommended. Electronically Signed   By: Elgie CollardArash  Radparvar M.D.   On: 01/07/2020 17:42   MR BRAIN WO CONTRAST  Addendum Date: 01/07/2020   ADDENDUM REPORT: 01/07/2020 23:10 ADDENDUM: Study discussed by telephone with Dr. Mancel BaleELLIOTT Seneca Gadbois on 01/07/2020 at 23:10 . Electronically Signed   By: Odessa FlemingH  Hall M.D.   On: 01/07/2020 23:10   Result Date: 01/07/2020 CLINICAL DATA:  84 year old male with dimension multiple falls. Left side weakness for 1 week. EXAM: MRI HEAD WITHOUT CONTRAST TECHNIQUE: Multiplanar, multiecho pulse sequences of the brain and surrounding structures were obtained without intravenous contrast. COMPARISON:  Head CT earlier today. Brain MRI 01/31/2005. FINDINGS: Brain: Widely scattered and multifocal confluent areas of restricted diffusion in the right ACA territory affecting the right cingulate and superior frontal gyrus plus some of the body of the corpus callosum (series 4, image 23). Associated cytotoxic edema with T2 and FLAIR hyperintensity. There is petechial  hemorrhage in the right superior frontal gyrus on SWI series 6, image 87. No associated mass effect. Contralateral chronic encephalomalacia in the left ACA territory. No other restricted diffusion. Substantially decreased cerebral volume since 2006 and widespread, confluent bilateral cerebral white matter T2 and FLAIR hyperintensity now. No midline shift, mass effect, evidence of mass lesion, ventriculomegaly, extra-axial collection. Cervicomedullary junction and pituitary are within normal limits. No other cortical encephalomalacia identified. The deep gray matter nuclei also seem relatively spared, there are some small chronic lacunar infarcts of the anterior right basal ganglia. Brainstem and cerebellum appear within normal limits for age. Vascular: Major intracranial vascular flow voids are stable since 2006. Skull and upper cervical spine: Negative visible cervical spine. Visualized bone marrow signal is within normal limits. Sinuses/Orbits: Postoperative changes to both globes otherwise negative orbits. Stable paranasal sinus and mastoid pneumatization. Other: Visible internal auditory structures appear negative. Scalp and face soft tissues appear negative. IMPRESSION: 1. Confluent Acute Right ACA territory infarcts with mild petechial hemorrhage but no malignant hemorrhagic transformation or mass effect. 2. Chronic left ACA territory infarct. 3. Substantially decreased cerebral volume since a 2006 MRI with advanced but nonspecific cerebral white matter signal changes since that time. Electronically Signed: By: Odessa FlemingH  Hall M.D. On: 01/07/2020 23:05    Procedures .Critical Care Performed by: Mancel BaleWentz, Saatvik Thielman, MD Authorized by: Mancel BaleWentz, Seung Nidiffer, MD   Critical care provider statement:    Critical care time (minutes):  35   Critical care start time:  01/07/2020 4:45 PM   Critical care end time:  01/07/2020 11:18 PM   Critical care time was exclusive of:  Separately billable procedures and treating other  patients   Critical care was necessary to treat or prevent imminent or life-threatening deterioration of the following conditions:  CNS failure or compromise   Critical care was time spent personally by me on the following activities:  Blood draw for specimens, development of treatment plan with patient or surrogate, discussions with consultants, evaluation of patient's response to treatment, examination of patient, obtaining  history from patient or surrogate, ordering and performing treatments and interventions, ordering and review of laboratory studies, pulse oximetry, re-evaluation of patient's condition, review of old charts and ordering and review of radiographic studies   (including critical care time)  Medications Ordered in ED Medications  0.9 %  sodium chloride infusion (has no administration in time range)  tuberculin injection 5 Units (has no administration in time range)  LORazepam (ATIVAN) injection 1 mg (has no administration in time range)  sodium chloride 0.9 % bolus 500 mL (0 mLs Intravenous Stopped 01/07/20 2306)    ED Course  I have reviewed the triage vital signs and the nursing notes.  Pertinent labs & imaging results that were available during my care of the patient were reviewed by me and considered in my medical decision making (see chart for details).  Clinical Course as of Jan 06 2319  Mon Jan 07, 2020  1718 Patient's granddaughter talk to me outside the room stating that her uncle was trying to get the patient placed at a facility, the [facility].  They need an FL-2  form, a TB test, and a Covid test to start that process.   [EW]  2033 Normal except glucose high, creatinine high, calcium low, total protein low, albumin low, total bilirubin high, GFR low  Comprehensive metabolic panel(!) [EW]  2034 Normal  Urinalysis, Routine w reflex microscopic [EW]  2034 Venous gas, normal except CO2 low, bicarb high  POCT I-Stat EG7(!!) [EW]  2034 Normal  CBC with  Differential(!) [EW]  2035 No mass or CVA, interpreted by me  CT Head Wo Contrast [EW]  2035 No infiltrate or CHF, interpreted by me  DG Chest 1 View [EW]  2035 No obstruction or significant obstipation, interpreted by me  DG Abdomen 1 View [EW]  2036 Hemoglobin: 14.8 [EW]  2040 Patient is awake, grabbing at cords, and talking incoherently.  Patient's wife, and granddaughter confirm that this mental status is different from 2 weeks ago along with his difficulty walking which has been present for 2 weeks.  ACS significant change in the last 2 weeks.  We discussed that the findings at this point do not indicate a cause, therefore we will proceed with MRI imaging.   [EW]  2210 MR BRAIN WO CONTRAST [EW]  2313 Case discussed with neurologist who interpreted the brain MRI, it is consistent with a right ACA acute infarct.  He also has generalized brain loss, over the preceding 14 years.   [EW]    Clinical Course User Index [EW] Mancel Bale, MD   MDM Rules/Calculators/A&P                       Patient Vitals for the past 24 hrs:  BP Temp Temp src Pulse Resp  01/07/20 2305 -- -- -- -- 17  01/07/20 2303 (!) 162/88 -- -- -- --  01/07/20 2115 (!) 144/75 -- -- -- (!) 24  01/07/20 2026 (!) 161/68 -- -- 74 18  01/07/20 1635 -- 97.6 F (36.4 C) Oral -- --      Medical Decision Making: Patient presenting with generalized weakness, for several weeks, followed by new onset left leg weakness, today,  making it impossible for him to walk.  Current family members, as recently as 2 weeks ago he was able to ambulate easily in the house, and perform any of his own activities of daily living.  He has decompensated the last 2 weeks, had several falls without serious  injury. Since yesterday he has been essentially bedbound.  A family member is trying to get him placed for his progressive dysfunction.  Initial screening exam did not indicate cause for left-sided weakness.  MRI imaging ordered, to look for CT  occult stroke.  This returned, read as positive for right ACA stroke.  no exercise bacterial infection, metabolic instability or suggestion for impending vascular collapse.  Patient requires hospitalization for further evaluation and treatment.  CRITICAL CARE- yes Performed by: Mancel Bale   Nursing Notes Reviewed/ Care Coordinated Applicable Imaging Reviewed Interpretation of Laboratory Data incorporated into ED treatment  11:20 PM-Consult complete with hospitalist. Patient case explained and discussed. She agrees to admit patient for further evaluation and treatment. Call ended at 12:05 AM  Plan: Admit  Final Clinical Impression(s) / ED Diagnoses Final diagnoses:  Acute CVA (cerebrovascular accident) [facility])    Rx / DC Orders ED Discharge Orders    None       Mancel Bale, MD 01/08/20 0010

## 2020-01-08 ENCOUNTER — Inpatient Hospital Stay (HOSPITAL_COMMUNITY): Payer: Medicare Other

## 2020-01-08 ENCOUNTER — Other Ambulatory Visit: Payer: Self-pay

## 2020-01-08 DIAGNOSIS — G301 Alzheimer's disease with late onset: Secondary | ICD-10-CM

## 2020-01-08 DIAGNOSIS — I639 Cerebral infarction, unspecified: Secondary | ICD-10-CM | POA: Diagnosis not present

## 2020-01-08 DIAGNOSIS — R627 Adult failure to thrive: Secondary | ICD-10-CM | POA: Diagnosis present

## 2020-01-08 DIAGNOSIS — R17 Unspecified jaundice: Secondary | ICD-10-CM | POA: Diagnosis present

## 2020-01-08 DIAGNOSIS — Z20822 Contact with and (suspected) exposure to covid-19: Secondary | ICD-10-CM | POA: Diagnosis present

## 2020-01-08 DIAGNOSIS — F0280 Dementia in other diseases classified elsewhere without behavioral disturbance: Secondary | ICD-10-CM | POA: Diagnosis present

## 2020-01-08 DIAGNOSIS — I63421 Cerebral infarction due to embolism of right anterior cerebral artery: Secondary | ICD-10-CM | POA: Diagnosis not present

## 2020-01-08 DIAGNOSIS — E669 Obesity, unspecified: Secondary | ICD-10-CM | POA: Diagnosis present

## 2020-01-08 DIAGNOSIS — Z9181 History of falling: Secondary | ICD-10-CM | POA: Diagnosis not present

## 2020-01-08 DIAGNOSIS — R2242 Localized swelling, mass and lump, left lower limb: Secondary | ICD-10-CM | POA: Diagnosis not present

## 2020-01-08 DIAGNOSIS — Z79899 Other long term (current) drug therapy: Secondary | ICD-10-CM | POA: Diagnosis not present

## 2020-01-08 DIAGNOSIS — I351 Nonrheumatic aortic (valve) insufficiency: Secondary | ICD-10-CM | POA: Diagnosis not present

## 2020-01-08 DIAGNOSIS — Z8673 Personal history of transient ischemic attack (TIA), and cerebral infarction without residual deficits: Secondary | ICD-10-CM | POA: Diagnosis present

## 2020-01-08 DIAGNOSIS — E785 Hyperlipidemia, unspecified: Secondary | ICD-10-CM | POA: Diagnosis present

## 2020-01-08 DIAGNOSIS — Z9049 Acquired absence of other specified parts of digestive tract: Secondary | ICD-10-CM | POA: Diagnosis not present

## 2020-01-08 DIAGNOSIS — G309 Alzheimer's disease, unspecified: Secondary | ICD-10-CM | POA: Diagnosis present

## 2020-01-08 DIAGNOSIS — R29718 NIHSS score 18: Secondary | ICD-10-CM | POA: Diagnosis present

## 2020-01-08 DIAGNOSIS — G8194 Hemiplegia, unspecified affecting left nondominant side: Secondary | ICD-10-CM | POA: Diagnosis present

## 2020-01-08 DIAGNOSIS — M19072 Primary osteoarthritis, left ankle and foot: Secondary | ICD-10-CM | POA: Diagnosis present

## 2020-01-08 DIAGNOSIS — Z6826 Body mass index (BMI) 26.0-26.9, adult: Secondary | ICD-10-CM | POA: Diagnosis not present

## 2020-01-08 DIAGNOSIS — M7989 Other specified soft tissue disorders: Secondary | ICD-10-CM | POA: Diagnosis not present

## 2020-01-08 DIAGNOSIS — I82441 Acute embolism and thrombosis of right tibial vein: Secondary | ICD-10-CM | POA: Diagnosis present

## 2020-01-08 DIAGNOSIS — R1310 Dysphagia, unspecified: Secondary | ICD-10-CM | POA: Diagnosis present

## 2020-01-08 DIAGNOSIS — Z885 Allergy status to narcotic agent status: Secondary | ICD-10-CM | POA: Diagnosis not present

## 2020-01-08 DIAGNOSIS — R509 Fever, unspecified: Secondary | ICD-10-CM | POA: Diagnosis not present

## 2020-01-08 DIAGNOSIS — Z66 Do not resuscitate: Secondary | ICD-10-CM | POA: Diagnosis present

## 2020-01-08 LAB — GLUCOSE, CAPILLARY: Glucose-Capillary: 104 mg/dL — ABNORMAL HIGH (ref 70–99)

## 2020-01-08 LAB — BILIRUBIN, FRACTIONATED(TOT/DIR/INDIR)
Bilirubin, Direct: 0.3 mg/dL — ABNORMAL HIGH (ref 0.0–0.2)
Indirect Bilirubin: 1.5 mg/dL — ABNORMAL HIGH (ref 0.3–0.9)
Total Bilirubin: 1.8 mg/dL — ABNORMAL HIGH (ref 0.3–1.2)

## 2020-01-08 LAB — CBG MONITORING, ED: Glucose-Capillary: 93 mg/dL (ref 70–99)

## 2020-01-08 LAB — ECHOCARDIOGRAM COMPLETE

## 2020-01-08 MED ORDER — ACETAMINOPHEN 160 MG/5ML PO SOLN: 650.0000 mg | ORAL | Status: DC | PRN

## 2020-01-08 MED ORDER — STROKE: EARLY STAGES OF RECOVERY BOOK
Freq: Once | Status: AC
  Filled 2020-01-08: qty 1

## 2020-01-08 MED ORDER — ENOXAPARIN SODIUM 40 MG/0.4ML ~~LOC~~ SOLN
40.0000 mg | SUBCUTANEOUS | Status: DC
  Administered 2020-01-08 – 2020-01-14 (×7): 40 mg via SUBCUTANEOUS
  Filled 2020-01-08 (×7): qty 0.4

## 2020-01-08 MED ORDER — ACETAMINOPHEN 650 MG RE SUPP: 650.0000 mg | RECTAL | Status: DC | PRN

## 2020-01-08 MED ORDER — ASPIRIN 300 MG RE SUPP
300.0000 mg | Freq: Once | RECTAL | Status: AC
  Administered 2020-01-09: 300 mg via RECTAL
  Filled 2020-01-08: qty 1

## 2020-01-08 MED ORDER — MEMANTINE HCL 10 MG PO TABS
10.0000 mg | ORAL_TABLET | Freq: Two times a day (BID) | ORAL | Status: DC
  Administered 2020-01-09 – 2020-01-15 (×12): 10 mg via ORAL
  Filled 2020-01-08 (×15): qty 1

## 2020-01-08 MED ORDER — ASPIRIN 300 MG RE SUPP
300.0000 mg | Freq: Once | RECTAL | Status: AC
  Administered 2020-01-08: 300 mg via RECTAL
  Filled 2020-01-08: qty 1

## 2020-01-08 MED ORDER — RIVASTIGMINE 4.6 MG/24HR TD PT24
4.6000 mg | MEDICATED_PATCH | Freq: Every day | TRANSDERMAL | Status: DC
  Administered 2020-01-08 – 2020-01-15 (×8): 4.6 mg via TRANSDERMAL
  Filled 2020-01-08 (×8): qty 1

## 2020-01-08 MED ORDER — SENNOSIDES-DOCUSATE SODIUM 8.6-50 MG PO TABS
1.0000 | ORAL_TABLET | Freq: Every evening | ORAL | Status: DC | PRN
  Administered 2020-01-12: 1 via ORAL
  Filled 2020-01-08: qty 1

## 2020-01-08 MED ORDER — ACETAMINOPHEN 325 MG PO TABS: 650.0000 mg | ORAL_TABLET | ORAL | Status: DC | PRN

## 2020-01-08 NOTE — ED Notes (Signed)
Pt returned from MRI. Spouse at bedside.  

## 2020-01-08 NOTE — H&P (Signed)
History and Physical    Kyle [name]:[id number] DOB: [birth year] DOA: 01/07/2020  PCP: Burton Apley, MD Patient coming from: Home  Chief Complaint: Left-sided weakness  HPI: Kyle [name] is a 84 y.o. male with medical history significant of advanced Alzheimer's dementia presenting to the ED for evaluation of left-sided weakness which started a week ago.  No history could be obtained from the patient given his baseline events dementia.  History obtained from wife at bedside.  Wife states for the past 1 week patient has had weakness in his left arm and left leg.  The other day he was trying to walk using a walker but slid down on the floor.  He did not hit his head or sustain any injuries from the fall.  His appetite has been good.  He has had a mild nonproductive cough.  No fevers or vomiting.  Wife states patient is confused at baseline and not able to have a conversation.  ED Course: Temperature 100.1 F.  Not tachycardic.  Slightly tachypneic.  Not hypotensive.  Labs showing no leukocytosis.  Blood glucose 123.  Creatinine 1.4, at baseline.  T bili 1.9, remainder of LFTs normal.  T bili was normal on labs done a year ago.  SARS-CoV-2 PCR test negative.  UA not suggestive of infection.  Chest x-ray showing cardiomegaly and no active disease.  Abdominal x-ray showing no acute findings.  Head CT negative for acute intracranial hemorrhage and multifocal hypodensities with loss of gray-white matter discrimination.  Brain MRI showing acute right ACA territory infarcts with mild petechial hemorrhage but no malignant hemorrhagic transformation or mass-effect.  Chronic left ACA territory infarct. Patient received a 500 cc normal saline bolus.  Patient was seen by neurology-outside window for thrombolysis or endovascular thrombectomy due to last known normal over a week ago.  Review of Systems:  All systems reviewed and apart from history of presenting illness, are negative.  Past Medical History:   Diagnosis Date  . Alzheimer disease (HCC) 09/25/2019    Past Surgical History:  Procedure Laterality Date  . APPENDECTOMY    . BACK SURGERY    . CHOLECYSTECTOMY    . HERNIA REPAIR       reports that he has never smoked. He has never used smokeless tobacco. He reports that he does not drink alcohol or use drugs.  Allergies  Allergen Reactions  . Demerol [Meperidine] Nausea And Vomiting    Family History  Problem Relation Age of Onset  . Pneumonia Mother   . Kidney failure Father     Prior to Admission medications   Medication Sig Start Date End Date Taking? Authorizing Provider  memantine (NAMENDA) 10 MG tablet Take 10 mg by mouth 2 (two) times daily.   Yes [provider]  rivastigmine (EXELON) 4.6 mg/24hr Place 1 patch (4.6 mg total) onto the skin daily. 09/25/19  Yes York Spaniel, MD    Physical Exam: Vitals:   01/08/20 0226 01/08/20 0230 01/08/20 0316 01/08/20 0531  BP: 137/72 (!) 144/69 (!) 155/65 (!) 141/72  Pulse: 69  (!) 47 62  Resp: 18 (!) 27 (!) 26 (!) 22  Temp:      TempSrc:      SpO2:   94% 96%    Physical Exam  Constitutional: He appears well-developed and well-nourished. No distress.  HENT:  Head: Normocephalic.  Eyes: Right eye exhibits no discharge. Left eye exhibits no discharge.  Cardiovascular: Normal rate, regular rhythm and intact distal pulses.  Pulmonary/Chest:  Effort normal and breath sounds normal. No respiratory distress. He has no wheezes. He has no rales.  Abdominal: Soft. Bowel sounds are normal. He exhibits no distension. There is no abdominal tenderness. There is no guarding.  Musculoskeletal:        General: No edema.     Cervical back: Neck supple.  Neurological:  Awake Following commands only intermittently Noted to have significant weakness of his left upper and lower extremities Neuro exam very limited as patient is not able to participate given his advanced dementia   Skin: Skin is warm and dry. He is not  diaphoretic.     Labs on Admission: I have personally reviewed following labs and imaging studies  CBC: Recent Labs  Lab 01/07/20 1814 01/07/20 1835  WBC 10.4  --   NEUTROABS 7.8*  --   HGB 14.8 14.6  HCT 45.4 43.0  MCV 93.0  --   PLT 151  --    Basic Metabolic Panel: Recent Labs  Lab 01/07/20 1814 01/07/20 1835  NA 137 139  K 3.9 3.9  CL 103  --   CO2 26  --   GLUCOSE 123*  --   BUN 17  --   CREATININE 1.40*  --   CALCIUM 8.3*  --    GFR: CrCl cannot be calculated (Unknown ideal weight.). Liver Function Tests: Recent Labs  Lab 01/07/20 1814  AST 20  ALT 24  ALKPHOS 74  BILITOT 1.9*  PROT 6.3*  ALBUMIN 3.2*   No results for input(s): LIPASE, AMYLASE in the last 168 hours. No results for input(s): AMMONIA in the last 168 hours. Coagulation Profile: No results for input(s): INR, PROTIME in the last 168 hours. Cardiac Enzymes: No results for input(s): CKTOTAL, CKMB, CKMBINDEX, TROPONINI in the last 168 hours. BNP (last 3 results) No results for input(s): PROBNP in the last 8760 hours. HbA1C: No results for input(s): HGBA1C in the last 72 hours. CBG: Recent Labs  Lab 01/07/20 1641  GLUCAP 92   Lipid Profile: No results for input(s): CHOL, HDL, LDLCALC, TRIG, CHOLHDL, LDLDIRECT in the last 72 hours. Thyroid Function Tests: No results for input(s): TSH, T4TOTAL, FREET4, T3FREE, THYROIDAB in the last 72 hours. Anemia Panel: No results for input(s): VITAMINB12, FOLATE, FERRITIN, TIBC, IRON, RETICCTPCT in the last 72 hours. Urine analysis:    Component Value Date/Time   COLORURINE YELLOW 01/07/2020 1814   APPEARANCEUR CLEAR 01/07/2020 1814   LABSPEC 1.018 01/07/2020 1814   PHURINE 6.0 01/07/2020 1814   GLUCOSEU NEGATIVE 01/07/2020 1814   HGBUR NEGATIVE 01/07/2020 1814   BILIRUBINUR NEGATIVE 01/07/2020 1814   KETONESUR NEGATIVE 01/07/2020 1814   PROTEINUR NEGATIVE 01/07/2020 1814   UROBILINOGEN 1.0 06/29/2014 1432   NITRITE NEGATIVE 01/07/2020  1814   LEUKOCYTESUR NEGATIVE 01/07/2020 1814    Radiological Exams on Admission: DG Chest 1 View  Result Date: 01/07/2020 CLINICAL DATA:  Left side weakness EXAM: CHEST  1 VIEW COMPARISON:  10/08/2009 FINDINGS: Cardiomegaly. No confluent opacities, effusions or edema. No acute bony abnormality. IMPRESSION: Cardiomegaly.  No active disease. Electronically Signed   By: Rolm Baptise M.D.   On: 01/07/2020 17:50   DG Abdomen 1 View  Result Date: 01/07/2020 CLINICAL DATA:  Cough EXAM: ABDOMEN - 1 VIEW COMPARISON:  07/15/2014 FINDINGS: Prior cholecystectomy. Nonobstructive bowel gas pattern. No organomegaly or free air. IMPRESSION: No acute findings. Electronically Signed   By: Rolm Baptise M.D.   On: 01/07/2020 17:50   CT Head Wo Contrast  Result Date: 01/07/2020  CLINICAL DATA:  84 year old male with history of Alzheimer's and multiple falls. EXAM: CT HEAD WITHOUT CONTRAST TECHNIQUE: Contiguous axial images were obtained from the base of the skull through the vertex without intravenous contrast. COMPARISON:  None. FINDINGS: Brain: There is moderate age-related atrophy and chronic microvascular ischemic changes. Bilateral frontal hypodensities with loss of gray-white matter discrimination (series 2, image 23 and 25), likely chronic. There is no acute intracranial hemorrhage. No mass effect or midline shift. No extra-axial fluid collection. Vascular: No hyperdense vessel or unexpected calcification. Skull: Normal. Negative for fracture or focal lesion. Sinuses/Orbits: Mild mucoperiosteal thickening of paranasal sinuses. No air-fluid level. The mastoid air cells are clear. Other: None IMPRESSION: 1. No acute intracranial hemorrhage. 2. Moderate age-related atrophy and chronic microvascular ischemic changes. Multifocal hypodensities with loss of gray-white matter discrimination, likely chronic. If there is high clinical concern for acute infarct further evaluation with MRI is recommended. Electronically Signed    By: Elgie Collard M.D.   On: 01/07/2020 17:42   MR BRAIN WO CONTRAST  Addendum Date: 01/07/2020   ADDENDUM REPORT: 01/07/2020 23:10 ADDENDUM: Study discussed by telephone with Dr. Mancel Bale on 01/07/2020 at 23:10 . Electronically Signed   By: Odessa Fleming M.D.   On: 01/07/2020 23:10   Result Date: 01/07/2020 CLINICAL DATA:  84 year old male with dimension multiple falls. Left side weakness for 1 week. EXAM: MRI HEAD WITHOUT CONTRAST TECHNIQUE: Multiplanar, multiecho pulse sequences of the brain and surrounding structures were obtained without intravenous contrast. COMPARISON:  Head CT earlier today. Brain MRI 01/31/2005. FINDINGS: Brain: Widely scattered and multifocal confluent areas of restricted diffusion in the right ACA territory affecting the right cingulate and superior frontal gyrus plus some of the body of the corpus callosum (series 4, image 23). Associated cytotoxic edema with T2 and FLAIR hyperintensity. There is petechial hemorrhage in the right superior frontal gyrus on SWI series 6, image 87. No associated mass effect. Contralateral chronic encephalomalacia in the left ACA territory. No other restricted diffusion. Substantially decreased cerebral volume since 2006 and widespread, confluent bilateral cerebral white matter T2 and FLAIR hyperintensity now. No midline shift, mass effect, evidence of mass lesion, ventriculomegaly, extra-axial collection. Cervicomedullary junction and pituitary are within normal limits. No other cortical encephalomalacia identified. The deep gray matter nuclei also seem relatively spared, there are some small chronic lacunar infarcts of the anterior right basal ganglia. Brainstem and cerebellum appear within normal limits for age. Vascular: Major intracranial vascular flow voids are stable since 2006. Skull and upper cervical spine: Negative visible cervical spine. Visualized bone marrow signal is within normal limits. Sinuses/Orbits: Postoperative changes to both  globes otherwise negative orbits. Stable paranasal sinus and mastoid pneumatization. Other: Visible internal auditory structures appear negative. Scalp and face soft tissues appear negative. IMPRESSION: 1. Confluent Acute Right ACA territory infarcts with mild petechial hemorrhage but no malignant hemorrhagic transformation or mass effect. 2. Chronic left ACA territory infarct. 3. Substantially decreased cerebral volume since a 2006 MRI with advanced but nonspecific cerebral white matter signal changes since that time. Electronically Signed: By: Odessa Fleming M.D. On: 01/07/2020 23:05    Assessment/Plan Principal Problem:   Stroke [facility]) Active Problems:   Alzheimer disease (HCC)   Serum total bilirubin elevated   Acute ischemic stroke Patient is presenting with a 1 week history of left-sided weakness.  Brain MRI showing acute right ACA territory infarct with mild petechial hemorrhage but no malignant hemorrhagic transformation or mass-effect.  Seen by neurology and outside window for thrombolysis or endovascular  thrombectomy.  Admitted for stroke work-up. -Telemetry monitoring -Allow for permissive hypertension-treat only if systolic blood pressures are greater than 220. -MRI angio head -2D echocardiogram -Hemoglobin A1c, fasting lipid panel -Neurology recommending holding off aspirin at this time -Frequent neurochecks -PT, OT, speech therapy. -N.p.o. until cleared by bedside swallow evaluation or formal speech evaluation -Neurology following, appreciate recommendations  ?Fever Afebrile initially but temperature 100.1 on subsequent vital signs done in the ED.  Slightly tachypneic but not hypoxic.  Not tachycardic or hypotensive.  No signs of sepsis.  Labs showing no leukocytosis.  UA not suggestive of infection.  Chest x-ray not suggestive of pneumonia.  SARS-CoV-2 PCR test negative. -Monitor body temperature closely. Will need additional work-up if repeat vitals continue to show  fever.  Elevated T bili T bili 1.9, remainder of LFTs normal.  T bili was normal on labs done a year ago.   -Check fractionated bilirubin level  Advanced Alzheimer's dementia -Continue home Exelon patch -Continue Namenda after patient passes swallow eval  DVT prophylaxis: SCDs Code Status: DNR.  Discussed with wife at bedside. Family Communication: Wife updated. Disposition Plan: Anticipate discharge to skilled nursing facility after stroke work-up is complete. Consults called: Neurology Admission status: It is my clinical opinion that admission to INPATIENT is reasonable and necessary in this 84 y.o. male . presenting with left-sided weakness x1 week.  MRI showing findings concerning for acute ischemic stroke.  Needs stroke work-up.  Given the aforementioned, the predictability of an adverse outcome is felt to be significant. I expect that the patient will require at least 2 midnights in the hospital to treat this condition.   The medical decision making on this patient was of high complexity and the patient is at high risk for clinical deterioration, therefore this is a level 3 visit.  John Giovanni MD [facility] Pager [phone]  If 7PM-7AM, please contact night-coverage [email or web address] Password [facility]  01/08/2020, 6:12 AM

## 2020-01-08 NOTE — Progress Notes (Signed)
STROKE TEAM PROGRESS NOTE   INTERVAL HISTORY Wife at bedside. Pt lying in bed, able to tell me his name and wife's name but not orientated to other, essentially nonverbal and not able to follow much simple commands either. He has left leg weakness but also only slight movement of right leg. However, left foot especially left big toe redness and swollen, nontender though. Will check DVT and X-ray.   Vitals:   01/08/20 1100 01/08/20 1200 01/08/20 1208 01/08/20 1300  BP: (!) 151/73 (!) 149/73  (!) 162/74  Pulse: 64 60  64  Resp: (!) 25 (!) 23  (!) 25  Temp:   98.3 F (36.8 C)   TempSrc:      SpO2: 94% 94%  94%    CBC:  Recent Labs  Lab 01/07/20 1814 01/07/20 1835  WBC 10.4  --   NEUTROABS 7.8*  --   HGB 14.8 14.6  HCT 45.4 43.0  MCV 93.0  --   PLT 151  --     Basic Metabolic Panel:  Recent Labs  Lab 01/07/20 1814 01/07/20 1835  NA 137 139  K 3.9 3.9  CL 103  --   CO2 26  --   GLUCOSE 123*  --   BUN 17  --   CREATININE 1.40*  --   CALCIUM 8.3*  --    Lipid Panel: No results found for: CHOL, TRIG, HDL, CHOLHDL, VLDL, LDLCALC HgbA1c: No results found for: HGBA1C Urine Drug Screen: No results found for: LABOPIA, COCAINSCRNUR, LABBENZ, AMPHETMU, THCU, LABBARB  Alcohol Level No results found for: ETH  IMAGING past 48 hours DG Chest 1 View  Result Date: 01/07/2020 CLINICAL DATA:  Left side weakness EXAM: CHEST  1 VIEW COMPARISON:  10/08/2009 FINDINGS: Cardiomegaly. No confluent opacities, effusions or edema. No acute bony abnormality. IMPRESSION: Cardiomegaly.  No active disease. Electronically Signed   By: Charlett Nose M.D.   On: 01/07/2020 17:50   DG Abdomen 1 View  Result Date: 01/07/2020 CLINICAL DATA:  Cough EXAM: ABDOMEN - 1 VIEW COMPARISON:  07/15/2014 FINDINGS: Prior cholecystectomy. Nonobstructive bowel gas pattern. No organomegaly or free air. IMPRESSION: No acute findings. Electronically Signed   By: Charlett Nose M.D.   On: 01/07/2020 17:50   CT Head Wo  Contrast  Result Date: 01/07/2020 CLINICAL DATA:  84 year old male with history of Alzheimer's and multiple falls. EXAM: CT HEAD WITHOUT CONTRAST TECHNIQUE: Contiguous axial images were obtained from the base of the skull through the vertex without intravenous contrast. COMPARISON:  None. FINDINGS: Brain: There is moderate age-related atrophy and chronic microvascular ischemic changes. Bilateral frontal hypodensities with loss of gray-white matter discrimination (series 2, image 23 and 25), likely chronic. There is no acute intracranial hemorrhage. No mass effect or midline shift. No extra-axial fluid collection. Vascular: No hyperdense vessel or unexpected calcification. Skull: Normal. Negative for fracture or focal lesion. Sinuses/Orbits: Mild mucoperiosteal thickening of paranasal sinuses. No air-fluid level. The mastoid air cells are clear. Other: None IMPRESSION: 1. No acute intracranial hemorrhage. 2. Moderate age-related atrophy and chronic microvascular ischemic changes. Multifocal hypodensities with loss of gray-white matter discrimination, likely chronic. If there is high clinical concern for acute infarct further evaluation with MRI is recommended. Electronically Signed   By: Elgie Collard M.D.   On: 01/07/2020 17:42   MR ANGIO HEAD WO CONTRAST  Result Date: 01/08/2020 CLINICAL DATA:  Stroke.  Left-sided weakness EXAM: MRA HEAD WITHOUT CONTRAST TECHNIQUE: Angiographic images of the Circle of Willis were obtained using MRA  technique without intravenous contrast. COMPARISON:  MRI head 01/07/2000 FINDINGS: Severe stenosis distal left vertebral artery V4 segment. Right vertebral artery patent. PICA patent bilaterally with the origins not imaged. Mild atherosclerotic disease in the basilar without stenosis. Moderate stenosis right P2. Mild stenosis left P2. Cavernous carotid widely patent bilaterally.  Negative for aneurysm. Occlusion of the right A2 segment corresponding to right anterior cerebral  artery infarct. Right middle cerebral artery patent. Multiple areas of irregularity and stenosis right M2 and M3 branches. Left anterior cerebral artery patent with mild atherosclerotic disease. Left middle cerebral artery patent with multiple areas of atherosclerotic irregularity and stenosis left M2 and M3 branches. IMPRESSION: 1. Occlusion of the right A2 segment corresponding to right anterior cerebral artery infarct. 2. Moderate to advanced intracranial atherosclerotic disease as above. Electronically Signed   By: Franchot Gallo M.D.   On: 01/08/2020 08:33   MR BRAIN WO CONTRAST  Addendum Date: 01/07/2020   ADDENDUM REPORT: 01/07/2020 23:10 ADDENDUM: Study discussed by telephone with Dr. Daleen Bo on 01/07/2020 at 23:10 . Electronically Signed   By: Genevie Ann M.D.   On: 01/07/2020 23:10   Result Date: 01/07/2020 CLINICAL DATA:  84 year old male with dimension multiple falls. Left side weakness for 1 week. EXAM: MRI HEAD WITHOUT CONTRAST TECHNIQUE: Multiplanar, multiecho pulse sequences of the brain and surrounding structures were obtained without intravenous contrast. COMPARISON:  Head CT earlier today. Brain MRI 01/31/2005. FINDINGS: Brain: Widely scattered and multifocal confluent areas of restricted diffusion in the right ACA territory affecting the right cingulate and superior frontal gyrus plus some of the body of the corpus callosum (series 4, image 23). Associated cytotoxic edema with T2 and FLAIR hyperintensity. There is petechial hemorrhage in the right superior frontal gyrus on SWI series 6, image 87. No associated mass effect. Contralateral chronic encephalomalacia in the left ACA territory. No other restricted diffusion. Substantially decreased cerebral volume since 2006 and widespread, confluent bilateral cerebral white matter T2 and FLAIR hyperintensity now. No midline shift, mass effect, evidence of mass lesion, ventriculomegaly, extra-axial collection. Cervicomedullary junction and  pituitary are within normal limits. No other cortical encephalomalacia identified. The deep gray matter nuclei also seem relatively spared, there are some small chronic lacunar infarcts of the anterior right basal ganglia. Brainstem and cerebellum appear within normal limits for age. Vascular: Major intracranial vascular flow voids are stable since 2006. Skull and upper cervical spine: Negative visible cervical spine. Visualized bone marrow signal is within normal limits. Sinuses/Orbits: Postoperative changes to both globes otherwise negative orbits. Stable paranasal sinus and mastoid pneumatization. Other: Visible internal auditory structures appear negative. Scalp and face soft tissues appear negative. IMPRESSION: 1. Confluent Acute Right ACA territory infarcts with mild petechial hemorrhage but no malignant hemorrhagic transformation or mass effect. 2. Chronic left ACA territory infarct. 3. Substantially decreased cerebral volume since a 2006 MRI with advanced but nonspecific cerebral white matter signal changes since that time. Electronically Signed: By: Genevie Ann M.D. On: 01/07/2020 23:05    PHYSICAL EXAM  Temp:  [97.7 F (36.5 C)-100.1 F (37.8 C)] 97.7 F (36.5 C) (01/12 1530) Pulse Rate:  [47-74] 60 (01/12 1530) Resp:  [16-27] 18 (01/12 1530) BP: (136-172)/(58-88) 172/58 (01/12 1530) SpO2:  [89 %-96 %] 96 % (01/12 1530) Weight:  [88.7 kg] 88.7 kg (01/12 1530)  General - Well nourished, well developed, in no apparent distress.  Ophthalmologic - fundi not visualized due to noncooperation.  Cardiovascular - Regular rhythm and rate except frequent PVCs on tele.  Extremities -  left foot swollen and red, nontender, more centered at left big toe  Neuro -  Awake, eyes open, able to tell me his name and wife's name but not orienated to time, place or age. Very limited speech output, not able to answer other questions. Severe dysarthria, not following peripheral simple commands. Blinking to  visual threat bilaterally. Attending to both sides but slow eye movement. PERRL. Facial symmetrical, but masked face. Tongue protrusion not cooperative. RUE 3/5 and LUE 3-/5 with drift. LLE flaccid and RLE 2/5 movement with significantly increased muscle tone. DTR 1+ and left babinski positive. Sensation, coordination and gait not tested.   ASSESSMENT/PLAN Kyle [name] is a 84 y.o. male with history of advanced dementia presenting following multiple severe falls with L sided weakness with decreased speech output.   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 Moderate small vessel disease. Atrophy.   MRI  R ACA infarct w/ mild petechial hemorrhage. Old L ACA infarct. Small vessel disease. Atrophy.   MRA  Occlusion R A2. Moderate to severe small vessel disease.   Carotid Doppler  pending  2D Echo EF 55-60%  LE venous doppler pending  Consider 30 day monitoring to rule out afib as outpt  LDL pending  HgbA1c pending  lovenox for VTE prophylaxis  No antithrombotic prior to admission, now on aspirin 300 mg suppository daily.   Therapy recommendations:  pending  Disposition:  pending   Left foot red and swollen  X-ray pending  LE venous doppler pending  Blood culture pending  Treatment as per primary team  Low grade fever  Tmax 100.1  UA neg  Blood culture pending  On IVF  Dysphagia . Secondary to stroke . NPO . Speech on board . Resume home meds and add DAPT once pass swallow   Other Stroke Risk Factors  Advanced age  Other Active Problems  Severe Alzheimer's dementia on namenda and exelon patch  Hospital day # 0  Marvel Plan, MD PhD Stroke Neurology 01/08/2020 5:45 PM  To contact Stroke Continuity provider, please refer to [email or web address]. After hours, contact General Neurology

## 2020-01-08 NOTE — Progress Notes (Signed)
  Echocardiogram 2D Echocardiogram has been performed.  Adine Madura 01/08/2020, 11:54 AM

## 2020-01-08 NOTE — ED Notes (Signed)
Pt does not seem to understand needs to keep right arm straight d/t IV infusing. 2nd IV site placed. Pt tolerated well.

## 2020-01-08 NOTE — ED Notes (Signed)
Echo Tech in w/pt.  

## 2020-01-08 NOTE — ED Notes (Signed)
Spouse aware pt has been assigned Rm [id number] - advised she is en route to room at this time. Pt being transported to room via stretcher w/telemetry.

## 2020-01-08 NOTE — ED Notes (Signed)
Pt pulling off pulse ox even after much encouragement. Pt also attempting to remove IV - secured w/gauze.

## 2020-01-08 NOTE — ED Notes (Signed)
Admitting at bedside 

## 2020-01-08 NOTE — ED Notes (Signed)
Spouse aware pt NPO.

## 2020-01-08 NOTE — ED Notes (Signed)
Transported to MRI - spouse at bedside.

## 2020-01-08 NOTE — Progress Notes (Signed)
Patient admitted earlier today by Dr. John Giovanni earlier today for left-sided weakness.  84 year old male with history of Alzheimer's dementia, who presented with left-sided weakness which started approximately 1 week ago.  Patient admitted with acute ischemic CVA.  Neurology consulted and appreciated.  Pending further neurology recommendations, CVA work-up, PT and OT evaluations.  Daneshia Tavano D.O. [facility] If 7PM-7AM, please contact night-coverage [email or web address] 01/08/2020, 1:53 PM

## 2020-01-08 NOTE — ED Notes (Signed)
Attempted to call report - Secretary advised, Lauren, RN, will return call.

## 2020-01-08 NOTE — ED Notes (Signed)
Spouse requested to be contacted at home phone number when pt is assigned bed.

## 2020-01-08 NOTE — Progress Notes (Signed)
Patient arrived to [phone] Alert and oriented to self. Wife at bedside. POC provided to patient and pt wife. Call bell within reach.

## 2020-01-08 NOTE — ED Notes (Signed)
Condom cath intact to pt.  

## 2020-01-08 NOTE — Consult Note (Addendum)
Neurology Consultation  Reason for Consult: Stroke on MRI Referring Physician: Dr. Daleen Bo  CC: Left leg weakness, falls for the past week  History is obtained from: Wife, chart review  HPI: Kyle [name] is a 84 y.o. male past medical history of advanced dementia with last neurological appointment of 2020 MMSE being 6/30, requiring helps with all ADLs at baseline, brought into the hospital because of increasing frequency of falls and the note that his left leg seem to be exceptionally weak and was not able to support his weight. According to the wife, she started noticing him having falls Monday 1 week ago-12/31/2019.  She lives with him at home and have no help at home.  Couple of his falls were very bad and required his son to come over and help him.  He has also not been talking much for the past couple of days and only mumbling. Wife finally decided to call EMS who brought him to the hospital for the frequent falls and left leg paralysis and worsening of the left leg weakness compared to a week ago. He is outside the window for any intervention-either IV thrombolysis or endovascular thrombectomy due to last known normal being almost 8 days ago as well as modified Rankin of 4. MRI revealed strokes as below.  Neurological consultation for further work-up.  LKW: 8 days ago tpa given?: no, outside the window Premorbid modified Rankin scale (mRS):4  ROS:  Unable to obtain due to altered mental status.   Past Medical History:  Diagnosis Date  . Alzheimer disease ([place]) 09/25/2019    Family History  Problem Relation Age of Onset  . Pneumonia Mother   . Kidney failure Father     Social History:   reports that he has never smoked. He has never used smokeless tobacco. He reports that he does not drink alcohol or use drugs.  Medications  Current Facility-Administered Medications:  .  0.9 %  sodium chloride infusion, , Intravenous, Continuous, Daleen Bo, MD .  LORazepam  (ATIVAN) injection 1 mg, 1 mg, Intravenous, Q15 min PRN, Daleen Bo, MD .  tuberculin injection 5 Units, 5 Units, Intradermal, STAT, Daleen Bo, MD, Stopped at 01/08/20 0033  Current Outpatient Medications:  .  memantine (NAMENDA) 10 MG tablet, Take 10 mg by mouth 2 (two) times daily., Disp: , Rfl:  .  rivastigmine (EXELON) 4.6 mg/24hr, Place 1 patch (4.6 mg total) onto the skin daily., Disp: 30 patch, Rfl: 6   Exam: Current vital signs: BP (!) 147/72   Pulse 62   Temp 100.1 F (37.8 C)   Resp (!) 25   SpO2 (!) 89%  Vital signs in last 24 hours: Temp:  [97.6 F (36.4 C)-100.1 F (37.8 C)] 100.1 F (37.8 C) (01/11 2343) Pulse Rate:  [62-74] 62 (01/12 0015) Resp:  [17-25] 25 (01/12 0000) BP: (139-162)/(66-88) 147/72 (01/12 0015) SpO2:  [89 %-94 %] 89 % (01/12 0015) General: Patient is lying in bed comfortably in no apparent distress HEENT: Normocephalic atraumatic dry oral mucous membranes CVS: Regular rate rhythm Abdomen: Nondistended nontender Respiratory: Breathing well saturating normally on room air Extremities: Warm well perfused Neurological exam He is lying in bed comfortably in no distress.  Opens eyes to noxious stimulation. Mumbles unintelligible speech. Does not follow commands Does not produce any speech Cranial nerves: Pupils equal round react light, blinks to threat from both sides, face appears symmetric. Motor exam: Spontaneously moves right upper extremity.  To noxious stimulation, no movement in the left  lower extremity otherwise attempts to withdraw all remaining 3 extremities to noxious immolation. Sensory exam as above Coordination: Cannot be tested 1a Level of Conscious.: 1 1b LOC Questions: 2 1c LOC Commands: 2 2 Best Gaze: 0 3 Visual: 0 4 Facial Palsy: 0 5a Motor Arm - left: 2 5b Motor Arm - Right: 1 6a Motor Leg - Left: 3 6b Motor Leg - Right: 2 7 Limb Ataxia: 0 8 Sensory: 0 9 Best Language: 3 10 Dysarthria: 2 11 Extinct. and  Inatten.: 0 TOTAL: 18  Labs I have reviewed labs in epic and the results pertinent to this consultation are:  CBC    Component Value Date/Time   WBC 10.4 01/07/2020 1814   RBC 4.88 01/07/2020 1814   HGB 14.6 01/07/2020 1835   HCT 43.0 01/07/2020 1835   PLT 151 01/07/2020 1814   MCV 93.0 01/07/2020 1814   MCH 30.3 01/07/2020 1814   MCHC 32.6 01/07/2020 1814   RDW 13.1 01/07/2020 1814   LYMPHSABS 1.3 01/07/2020 1814   MONOABS 1.1 (H) 01/07/2020 1814   EOSABS 0.1 01/07/2020 1814   BASOSABS 0.0 01/07/2020 1814    CMP     Component Value Date/Time   NA 139 01/07/2020 1835   K 3.9 01/07/2020 1835   CL 103 01/07/2020 1814   CO2 26 01/07/2020 1814   GLUCOSE 123 (H) 01/07/2020 1814   BUN 17 01/07/2020 1814   CREATININE 1.40 (H) 01/07/2020 1814   CALCIUM 8.3 (L) 01/07/2020 1814   PROT 6.3 (L) 01/07/2020 1814   ALBUMIN 3.2 (L) 01/07/2020 1814   AST 20 01/07/2020 1814   ALT 24 01/07/2020 1814   ALKPHOS 74 01/07/2020 1814   BILITOT 1.9 (H) 01/07/2020 1814   GFRNONAA 46 (L) 01/07/2020 1814   GFRAA 54 (L) 01/07/2020 1814    Imaging I have reviewed the images obtained:  CT-scan of the brain-extensive chronic white matter disease.  No bleed  MRI examination of the brain-large confluent acute right ACA territory infarct with mild petechial hemorrhage but no malignant hemorrhagic transformation or mass-effect.  Chronic left ACA territory infarct.  Substantially reduced cerebral volume since the MRI of 2006.  Assessment: 84 year old man with fairly advanced dementia, presenting for a 7 to 8 days worth of decreased verbal output and frequent falls along with left leg weakness. MRI consistent with a right ACA territory infarct with mild petechial hemorrhage. Likely cardioembolic versus atheroembolic At this time, his examination is consistent not just with the stroke but also appears that he has failure to thrive as well as pretty significantly advanced dementia. I would recommend a  stroke or risk factor work-up below, but I am not sure if that would make much of a difference in an improvement of quality of life for this gentleman.  I think what he needs is more of a palliative care consultation along with placement as he lives at home with his elderly wife who is no longer able to take care of him  Impression: Acute ischemic stroke Advanced dementia - last outpatient MMSE 6/30 by Dr. Anne Hahn. Failure to thrive  Recommendations: Telemetry Frequent neurochecks I would hold off on aspirin for now Carotid Dopplers MR angio head if able to.  We will avoid CTA due to given derangement and renal function 2D echo A1c Lipid panel PT OT Speech therapy Wife would like resources for placement-interested in pursuing [facility]-Case management/social work consult would be appreciated.  Stroke team to follow-please call with questions.  -- Milon Dikes, MD  [facility] Pager: [phone] If 7pm to 7am, please call on call as listed on AMION.

## 2020-01-09 ENCOUNTER — Inpatient Hospital Stay (HOSPITAL_COMMUNITY): Payer: Medicare Other

## 2020-01-09 ENCOUNTER — Encounter (HOSPITAL_COMMUNITY): Payer: Medicare Other

## 2020-01-09 DIAGNOSIS — M7989 Other specified soft tissue disorders: Secondary | ICD-10-CM

## 2020-01-09 DIAGNOSIS — I639 Cerebral infarction, unspecified: Secondary | ICD-10-CM

## 2020-01-09 DIAGNOSIS — R17 Unspecified jaundice: Secondary | ICD-10-CM

## 2020-01-09 DIAGNOSIS — F0280 Dementia in other diseases classified elsewhere without behavioral disturbance: Secondary | ICD-10-CM

## 2020-01-09 LAB — LIPID PANEL
Cholesterol: 169 mg/dL (ref 0–200)
HDL: 34 mg/dL — ABNORMAL LOW (ref >40)
LDL Cholesterol: 119 mg/dL — ABNORMAL HIGH (ref 0–99)
Total CHOL/HDL Ratio: 5 RATIO
Triglycerides: 80 mg/dL (ref <150)
VLDL: 16 mg/dL (ref 0–40)

## 2020-01-09 LAB — HEMOGLOBIN A1C
Hgb A1c MFr Bld: 5.5 % (ref 4.8–5.6)
Mean Plasma Glucose: 111.15 mg/dL

## 2020-01-09 LAB — D-DIMER, QUANTITATIVE: D-Dimer, Quant: 4.44 ug/mL-FEU — ABNORMAL HIGH (ref 0.00–0.50)

## 2020-01-09 MED ORDER — TUBERCULIN PPD 5 UNIT/0.1ML ID SOLN
5.0000 [IU] | Freq: Once | INTRADERMAL | Status: AC
  Administered 2020-01-09: 5 [IU] via INTRADERMAL
  Filled 2020-01-09: qty 0.1

## 2020-01-09 MED ORDER — ATORVASTATIN CALCIUM 40 MG PO TABS
40.0000 mg | ORAL_TABLET | Freq: Every day | ORAL | Status: DC
  Administered 2020-01-10 – 2020-01-14 (×5): 40 mg via ORAL
  Filled 2020-01-09 (×5): qty 1

## 2020-01-09 MED ORDER — ASPIRIN EC 81 MG PO TBEC
81.0000 mg | DELAYED_RELEASE_TABLET | Freq: Every day | ORAL | Status: DC
  Administered 2020-01-10 – 2020-01-15 (×6): 81 mg via ORAL
  Filled 2020-01-09 (×6): qty 1

## 2020-01-09 MED ORDER — CLOPIDOGREL BISULFATE 75 MG PO TABS
75.0000 mg | ORAL_TABLET | Freq: Every day | ORAL | Status: DC
  Administered 2020-01-09 – 2020-01-15 (×7): 75 mg via ORAL
  Filled 2020-01-09 (×7): qty 1

## 2020-01-09 NOTE — Progress Notes (Signed)
STROKE TEAM PROGRESS NOTE   INTERVAL HISTORY Wife at bedside. Pt neuro stable, no significant change. Left foot redness and swelling remain the same. X-ray of left foot showed arthrosis of the first MP joint. Blood culture pending  Vitals:   01/09/20 0000 01/09/20 0348 01/09/20 0900 01/09/20 1212  BP: (!) 165/69 (!) 163/75 (!) 157/65 (!) 155/69  Pulse: 60 61 61 (!) 59  Resp: 18 18 20  (!) 24  Temp: 98.4 F (36.9 C) 98.2 F (36.8 C) 98.4 F (36.9 C) 97.9 F (36.6 C)  TempSrc: Axillary Oral Oral Oral  SpO2: 95% 97% 96% 96%  Weight:      Height:        CBC:  Recent Labs  Lab 01/07/20 1814 01/07/20 1835  WBC 10.4  --   NEUTROABS 7.8*  --   HGB 14.8 14.6  HCT 45.4 43.0  MCV 93.0  --   PLT 151  --     Basic Metabolic Panel:  Recent Labs  Lab 01/07/20 1814 01/07/20 1835  NA 137 139  K 3.9 3.9  CL 103  --   CO2 26  --   GLUCOSE 123*  --   BUN 17  --   CREATININE 1.40*  --   CALCIUM 8.3*  --    Lipid Panel:     Component Value Date/Time   CHOL 169 01/09/2020 0230   TRIG 80 01/09/2020 0230   HDL 34 (L) 01/09/2020 0230   CHOLHDL 5.0 01/09/2020 0230   VLDL 16 01/09/2020 0230   LDLCALC 119 (H) 01/09/2020 0230   HgbA1c:  Lab Results  Component Value Date   HGBA1C 5.5 01/09/2020   Urine Drug Screen: No results found for: LABOPIA, COCAINSCRNUR, LABBENZ, AMPHETMU, THCU, LABBARB  Alcohol Level No results found for: ETH  IMAGING past 48 hours DG Chest 1 View  Result Date: 01/07/2020 CLINICAL DATA:  Left side weakness EXAM: CHEST  1 VIEW COMPARISON:  10/08/2009 FINDINGS: Cardiomegaly. No confluent opacities, effusions or edema. No acute bony abnormality. IMPRESSION: Cardiomegaly.  No active disease. Electronically Signed   By: Charlett NoseKevin  Dover M.D.   On: 01/07/2020 17:50   DG Abdomen 1 View  Result Date: 01/07/2020 CLINICAL DATA:  Cough EXAM: ABDOMEN - 1 VIEW COMPARISON:  07/15/2014 FINDINGS: Prior cholecystectomy. Nonobstructive bowel gas pattern. No organomegaly or  free air. IMPRESSION: No acute findings. Electronically Signed   By: Charlett NoseKevin  Dover M.D.   On: 01/07/2020 17:50   CT Head Wo Contrast  Result Date: 01/07/2020 CLINICAL DATA:  84 year old male with history of Alzheimer's and multiple falls. EXAM: CT HEAD WITHOUT CONTRAST TECHNIQUE: Contiguous axial images were obtained from the base of the skull through the vertex without intravenous contrast. COMPARISON:  None. FINDINGS: Brain: There is moderate age-related atrophy and chronic microvascular ischemic changes. Bilateral frontal hypodensities with loss of gray-white matter discrimination (series 2, image 23 and 25), likely chronic. There is no acute intracranial hemorrhage. No mass effect or midline shift. No extra-axial fluid collection. Vascular: No hyperdense vessel or unexpected calcification. Skull: Normal. Negative for fracture or focal lesion. Sinuses/Orbits: Mild mucoperiosteal thickening of paranasal sinuses. No air-fluid level. The mastoid air cells are clear. Other: None IMPRESSION: 1. No acute intracranial hemorrhage. 2. Moderate age-related atrophy and chronic microvascular ischemic changes. Multifocal hypodensities with loss of gray-white matter discrimination, likely chronic. If there is high clinical concern for acute infarct further evaluation with MRI is recommended. Electronically Signed   By: Elgie CollardArash  Radparvar M.D.   On: 01/07/2020 17:42  MR ANGIO HEAD WO CONTRAST  Result Date: 01/08/2020 CLINICAL DATA:  Stroke.  Left-sided weakness EXAM: MRA HEAD WITHOUT CONTRAST TECHNIQUE: Angiographic images of the Circle of Willis were obtained using MRA technique without intravenous contrast. COMPARISON:  MRI head 01/07/2000 FINDINGS: Severe stenosis distal left vertebral artery V4 segment. Right vertebral artery patent. PICA patent bilaterally with the origins not imaged. Mild atherosclerotic disease in the basilar without stenosis. Moderate stenosis right P2. Mild stenosis left P2. Cavernous carotid  widely patent bilaterally.  Negative for aneurysm. Occlusion of the right A2 segment corresponding to right anterior cerebral artery infarct. Right middle cerebral artery patent. Multiple areas of irregularity and stenosis right M2 and M3 branches. Left anterior cerebral artery patent with mild atherosclerotic disease. Left middle cerebral artery patent with multiple areas of atherosclerotic irregularity and stenosis left M2 and M3 branches. IMPRESSION: 1. Occlusion of the right A2 segment corresponding to right anterior cerebral artery infarct. 2. Moderate to advanced intracranial atherosclerotic disease as above. Electronically Signed   By: Marlan Palau M.D.   On: 01/08/2020 08:33   MR BRAIN WO CONTRAST  Addendum Date: 01/07/2020   ADDENDUM REPORT: 01/07/2020 23:10 ADDENDUM: Study discussed by telephone with Dr. Mancel Bale on 01/07/2020 at 23:10 . Electronically Signed   By: Odessa Fleming M.D.   On: 01/07/2020 23:10   Result Date: 01/07/2020 CLINICAL DATA:  84 year old male with dimension multiple falls. Left side weakness for 1 week. EXAM: MRI HEAD WITHOUT CONTRAST TECHNIQUE: Multiplanar, multiecho pulse sequences of the brain and surrounding structures were obtained without intravenous contrast. COMPARISON:  Head CT earlier today. Brain MRI 01/31/2005. FINDINGS: Brain: Widely scattered and multifocal confluent areas of restricted diffusion in the right ACA territory affecting the right cingulate and superior frontal gyrus plus some of the body of the corpus callosum (series 4, image 23). Associated cytotoxic edema with T2 and FLAIR hyperintensity. There is petechial hemorrhage in the right superior frontal gyrus on SWI series 6, image 87. No associated mass effect. Contralateral chronic encephalomalacia in the left ACA territory. No other restricted diffusion. Substantially decreased cerebral volume since 2006 and widespread, confluent bilateral cerebral white matter T2 and FLAIR hyperintensity now. No  midline shift, mass effect, evidence of mass lesion, ventriculomegaly, extra-axial collection. Cervicomedullary junction and pituitary are within normal limits. No other cortical encephalomalacia identified. The deep gray matter nuclei also seem relatively spared, there are some small chronic lacunar infarcts of the anterior right basal ganglia. Brainstem and cerebellum appear within normal limits for age. Vascular: Major intracranial vascular flow voids are stable since 2006. Skull and upper cervical spine: Negative visible cervical spine. Visualized bone marrow signal is within normal limits. Sinuses/Orbits: Postoperative changes to both globes otherwise negative orbits. Stable paranasal sinus and mastoid pneumatization. Other: Visible internal auditory structures appear negative. Scalp and face soft tissues appear negative. IMPRESSION: 1. Confluent Acute Right ACA territory infarcts with mild petechial hemorrhage but no malignant hemorrhagic transformation or mass effect. 2. Chronic left ACA territory infarct. 3. Substantially decreased cerebral volume since a 2006 MRI with advanced but nonspecific cerebral white matter signal changes since that time. Electronically Signed: By: Odessa Fleming M.D. On: 01/07/2020 23:05   DG Foot 2 Views Left  Result Date: 01/08/2020 CLINICAL DATA:  Localized swelling and redness of the left foot new onset left-sided weakness and paralysis. EXAM: LEFT FOOT - 2 VIEW COMPARISON:  None. FINDINGS: No acute fracture or traumatic malalignment is evident. There is focal severe arthrosis at the first metatarsophalangeal joint with extensive  sclerotic features and bony remodeling. Periarticular osteophyte formations are noted as well as mild soft tissue swelling. Additional degenerative changes throughout the left foot are more mild. Questionable lucency along the anterolateral corner of the cuboid could reflect a small marginal erosion. There is a well corticated ossification distal to the  tip of the lateral malleolus which could be a small ossicle or remote posttraumatic in nature IMPRESSION: Focally severe arthrosis of the first metatarsophalangeal joint with adjacent soft tissue swelling. Recommend correlation with mobility to assess for hallux limitus. Questionable erosion along the anterolateral corner of the cuboid, could reflect some underlying arthropathy. Correlation with exam findings and serologies could be considered. Electronically Signed   By: Kreg ShropshirePrice  DeHay M.D.   On: 01/08/2020 19:01   ECHOCARDIOGRAM COMPLETE  Result Date: 01/08/2020   ECHOCARDIOGRAM REPORT   Patient Name:   Kyle [name] Date of Exam: 01/08/2020 Medical Rec #:  [id number]       Height:       72.0 in Accession #:    [id number]      Weight:       197.3 lb Date of Birth:  06/24/1936        BSA:          2.12 m Patient Age:    84 years        BP:           148/71 mmHg Patient Gender: M               HR:           70 bpm. Exam Location:  Inpatient Procedure: 2D Echo, Cardiac Doppler and Color Doppler Indications:    Stroke 434.91  History:        Patient has no prior history of Echocardiogram examinations.                 Stroke; Risk Factors:Non-Smoker.  Sonographer:    Tonia GhentJulia Underwood RDCS Referring Phys: [id number] VASUNDHRA RATHORE IMPRESSIONS  1. Left ventricular ejection fraction, by visual estimation, is 55 to 60%. The left ventricle has normal function. There is no left ventricular hypertrophy.  2. The left ventricle has no regional wall motion abnormalities.  3. Global right ventricle has normal systolic function.The right ventricular size is normal. No increase in right ventricular wall thickness.  4. Left atrial size was normal.  5. Right atrial size was normal.  6. The mitral valve is normal in structure. No evidence of mitral valve regurgitation. No evidence of mitral stenosis.  7. The tricuspid valve is normal in structure.  8. The aortic valve is normal in structure. Aortic valve regurgitation is mild. No  evidence of aortic valve sclerosis or stenosis.  9. The pulmonic valve was normal in structure. Pulmonic valve regurgitation is not visualized. 10. Mildly elevated pulmonary artery systolic pressure. 11. The tricuspid regurgitant velocity is 2.80 m/s, and with an assumed right atrial pressure of 3 mmHg, the estimated right ventricular systolic pressure is mildly elevated at 34.4 mmHg. 12. The inferior vena cava is normal in size with greater than 50% respiratory variability, suggesting right atrial pressure of 3 mmHg. FINDINGS  Left Ventricle: Left ventricular ejection fraction, by visual estimation, is 55 to 60%. The left ventricle has normal function. The left ventricle has no regional wall motion abnormalities. The left ventricular internal cavity size was the left ventricle is normal in size. There is no left ventricular hypertrophy. Left ventricular diastolic parameters were normal. Normal left atrial pressure. Right  Ventricle: The right ventricular size is normal. No increase in right ventricular wall thickness. Global RV systolic function is has normal systolic function. The tricuspid regurgitant velocity is 2.80 m/s, and with an assumed right atrial pressure  of 3 mmHg, the estimated right ventricular systolic pressure is mildly elevated at 34.4 mmHg. Left Atrium: Left atrial size was normal in size. Right Atrium: Right atrial size was normal in size Pericardium: There is no evidence of pericardial effusion. Mitral Valve: The mitral valve is normal in structure. No evidence of mitral valve regurgitation. No evidence of mitral valve stenosis by observation. Tricuspid Valve: The tricuspid valve is normal in structure. Tricuspid valve regurgitation is trivial. Aortic Valve: The aortic valve is normal in structure. Aortic valve regurgitation is mild. The aortic valve is structurally normal, with no evidence of sclerosis or stenosis. Pulmonic Valve: The pulmonic valve was normal in structure. Pulmonic valve  regurgitation is not visualized. Pulmonic regurgitation is not visualized. Aorta: The aortic root, ascending aorta and aortic arch are all structurally normal, with no evidence of dilitation or obstruction. Venous: The inferior vena cava is normal in size with greater than 50% respiratory variability, suggesting right atrial pressure of 3 mmHg. IAS/Shunts: No atrial level shunt detected by color flow Doppler. There is no evidence of a patent foramen ovale. No ventricular septal defect is seen or detected. There is no evidence of an atrial septal defect.  LEFT VENTRICLE PLAX 2D LVIDd:         4.70 cm       Diastology LVIDs:         3.39 cm       LV e' lateral:   9.79 cm/s LV PW:         0.83 cm       LV E/e' lateral: 7.3 LV IVS:        0.81 cm       LV e' medial:    6.20 cm/s LVOT diam:     1.80 cm       LV E/e' medial:  11.5 LV SV:         55 ml LV SV Index:   25.76 LVOT Area:     2.54 cm  LV Volumes (MOD) LV area d, A2C:    34.30 cm LV area d, A4C:    33.90 cm LV area s, A2C:    19.00 cm LV area s, A4C:    22.80 cm LV major d, A2C:   7.88 cm LV major d, A4C:   7.50 cm LV major s, A2C:   6.22 cm LV major s, A4C:   6.54 cm LV vol d, MOD A2C: 126.0 ml LV vol d, MOD A4C: 128.0 ml LV vol s, MOD A2C: 51.5 ml LV vol s, MOD A4C: 69.3 ml LV SV MOD A2C:     74.5 ml LV SV MOD A4C:     128.0 ml LV SV MOD BP:      68.7 ml RIGHT VENTRICLE RV S prime:     16.50 cm/s TAPSE (M-mode): 1.9 cm LEFT ATRIUM             Index       RIGHT ATRIUM           Index LA diam:        3.00 cm 1.42 cm/m  RA Area:     15.30 cm LA Vol (A2C):   42.6 ml 20.11 ml/m RA Volume:   37.70 ml  17.80  ml/m LA Vol (A4C):   40.3 ml 19.02 ml/m LA Biplane Vol: 43.4 ml 20.49 ml/m  AORTIC VALVE LVOT Vmax:   120.00 cm/s LVOT Vmean:  75.300 cm/s LVOT VTI:    0.246 m  AORTA Ao Root diam: 3.20 cm MITRAL VALVE                        TRICUSPID VALVE MV Area (PHT): 2.99 cm             TR Peak grad:   31.4 mmHg MV PHT:        73.66 msec           TR Vmax:         280.00 cm/s MV Decel Time: 254 msec MV E velocity: 71.60 cm/s 103 cm/s  SHUNTS MV A velocity: 80.60 cm/s 70.3 cm/s Systemic VTI:  0.25 m MV E/A ratio:  0.89       1.5       Systemic Diam: 1.80 cm  Mihai Croitoru MD Electronically signed by Thurmon Fair MD Signature Date/Time: 01/08/2020/4:58:36 PM    Final     PHYSICAL EXAM   Temp:  [97.7 F (36.5 C)-98.4 F (36.9 C)] 97.9 F (36.6 C) (01/13 1212) Pulse Rate:  [59-64] 59 (01/13 1212) Resp:  [16-24] 24 (01/13 1212) BP: (139-172)/(58-80) 155/69 (01/13 1212) SpO2:  [81 %-97 %] 96 % (01/13 1212) Weight:  [88.7 kg] 88.7 kg (01/12 1530)  General - Well nourished, well developed, in no apparent distress.  Ophthalmologic - fundi not visualized due to noncooperation.  Cardiovascular - Regular rhythm and rate except frequent PVCs on tele.  Extremities - left foot swollen and red, nontender, more centered at left big toe  Neuro -  Awake, eyes open, able to tell me his name and wife's name but not orienated to time, place or age. Very limited speech output, not able to answer other questions. Severe dysarthria, not following peripheral simple commands. Blinking to visual threat bilaterally. Attending to both sides but slow eye movement. PERRL. Facial symmetrical, but masked face. Tongue protrusion not cooperative. RUE 3/5 and LUE 3-/5 with drift. LLE flaccid and RLE 2/5 movement with significantly increased muscle tone. DTR 1+ and left babinski positive. Sensation, coordination and gait not tested.   ASSESSMENT/PLAN Kyle [name] is a 84 y.o. male with history of advanced dementia presenting following multiple severe falls with L sided weakness with decreased speech output.   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 Moderate small vessel disease. Atrophy.   MRI  R ACA infarct w/ mild petechial hemorrhage. Old L ACA infarct. Small vessel disease. Atrophy.   MRA  Occlusion R A2. Moderate  to severe small vessel disease.   Carotid Doppler unremarkable  2D Echo EF 55-60%  LE venous doppler right posterior tibial vein acute DVT   TCD bubble study pending  LDL 119   HgbA1c 5.5   lovenox for VTE prophylaxis  No antithrombotic prior to admission, now on aspirin 81 and plavix 75 mg daily x 3 weeks then aspirin alone  Therapy recommendations:  SNF  Disposition:  pending   Right LE distal DVT  LE venous doppler right posterior tibial vein acute DVT   TCD bubble study pending  If positive for PFO, will consider anticoagulation  If no PFO, will close monitoring and repeat LE venous doppler in 1-2 weeks to rule out progression  Continue lovenox for DVT  D/c SCDs  Cause likely due to pt inactivity at home  Left foot red and swollen  X-ray severe arthrosis first MTJ w/ soft tissue swelling. ? Erosion anterolateral corner of cuboid, ? Underlying arthropathy   LE venous doppler no DVT on the left   Blood culture pending < 24h   Treatment as per primary team  Low grade fever  Tmax 100.1 -> afebrile  UA neg  WBC normal   Blood culture no growth < 24h   On IVF  Hyperlipidemia  LDL 119, goal LDL < 70  on no statin PTA  Add lipitor 40mg   Continue statin on discharge    Dysphagia . Secondary to stroke . NPO . Speech on board . Cleared for D2 thin liqs . Resumed home meds       Other Stroke Risk Factors  Advanced age  Other Active Problems  Severe Alzheimer's dementia on namenda and exelon patch  Elevated TBili, rest of LFT WNL, monitor as OP  Hospital day # 1  , MD PhD Stroke Neurology 01/09/2020 1:34 PM  To contact Stroke Continuity provider, please refer to 01/11/2020. After hours, contact General Neurology

## 2020-01-09 NOTE — Progress Notes (Signed)
Bilateral lower venous duplex and carotid artery duplex completed.  Refer to "CV Proc" under chart review to view preliminary results.  01/09/2020 4:36 PM Eula Fried., MHA, RVT, RDCS, RDMS

## 2020-01-09 NOTE — Progress Notes (Addendum)
PROGRESS NOTE  Kyle [name] YIR:[id number] DOB: 1936-11-04 DOA: 01/07/2020 PCP: Burton Apley, MD   LOS: 1 day   Brief narrative: As per HPI, Kyle [name] is a 84 y.o. male with medical history significant of advanced Alzheimer's dementia presenting to the ED for evaluation of left-sided weakness which started a week ago.  No history could be obtained from the patient given his baseline dementia.  History was obtained from wife at bedside.  Wife stated that for the past 1 week patient has had weakness in his left arm and left leg. When he was trying to walk using a walker but slid down on the floor.  He did not hit his head or sustain any injuries from the fall.   ED Course: Temperature 100.1 F.  Not tachycardic.  Slightly tachypneic.  Not hypotensive.  Labs showing no leukocytosis.  Blood glucose 123.  Creatinine 1.4, at baseline.  T bili 1.9, remainder of LFTs normal.  T bili was normal on labs done a year ago.  SARS-CoV-2 PCR test negative.  UA not suggestive of infection.  Chest x-ray showed cardiomegaly and no active disease.  Abdominal x-ray showed no acute findings.  Head CT was negative for acute intracranial hemorrhage but with multifocal hypodensities with loss of gray-white matter discrimination.  Brain MRI showed acute right ACA territory infarcts with mild petechial hemorrhage but no malignant hemorrhagic transformation or mass-effect.  Chronic left ACA territory infarct.  Patient received a 500 cc normal saline bolus.  Patient was seen by neurology-outside window for thrombolysis or endovascular thrombectomy due to last known normal over a week ago.  Assessment/Plan:  Principal Problem:   Stroke [facility]) Active Problems:   Alzheimer disease (HCC)   Serum total bilirubin elevated  Acute ischemic stroke Presented with 1 week history of left-sided weakness.  Brain MRI showed acute right ACA territory infarct with mild petechial hemorrhage but no malignant hemorrhagic  transformation or mass-effect.  Seen by neurology and patient was outside window for thrombolysis.  Patient was then admitted for stroke work-up.  We will continue telemetry monitor.  MR angiogram of the head showed occlusion of the right A2 segment corresponding to right anterior cerebral artery infarct.  2D echocardiogram performed on 01/08/2020 showed LV ejection fraction of 55 to 60%.  Carotid duplex ultrasound and lower extremity vascular duplex is still pending.  Lipid panel reviewed with HDL of 34.  LDL 119.  Hemoglobin A1c of 5.5.  Patient  has been seen by speech therapy and recommend dysphagia 2 diet.  Frequent neurochecks. PT, OT, speech therapy to follow.  We will continue to follow neurology recommendation.  Dysphagia secondary to stroke.  Neurology considering dual antiplatelet therapy.  Patient has been seen by speech therapy who recommend dysphagia 2 diet.  Mild grade fever No leukocytosis.  X-ray without any evidence of pneumonia.  Covid test was negative.  T-max of 98.4 at this time.  Urinalysis was negative.  Follow blood cultures.  Elevated T bilirubin T bili 1.9, mildly elevated direct bilirubin with disproportionate indirect bilirubin elevation.  Rest of the LFTs are within normal limits.  We will continue to monitor as outpatient.  Advanced Alzheimer's dementia Continue Namenda and Exelon patch.  Continue supportive care.  Left foot redness and swelling.  X-ray of the left foot showed arthrosis of the first metatarsophalangeal joint with soft tissue swelling.  Check lower extremity duplex.  Follow blood cultures.  Addendum:  01/09/2020 6:04 PM  Preliminary report of the lower extremity ultrasound  was reviewed which shows possible DVT in the right lower extremity.  Final report is still pending.  Patient does have a petechial hemorrhage in the brain.  Will get D-dimer, discuss with neurology regarding anticoagulation plan.  VTE Prophylaxis: Lovenox  Code Status: DO NOT  RESUSCITATE  Family Communication: None today.  Disposition Plan: Patient has been seen by physical therapy and Occupational Therapy.  Skilled nursing facility placement.  Check  carotid duplex ultrasound, lower extremity duplex.  Follow neurology recommendations.  Physical therapy evaluation today recommended skilled nursing facility.   Consultants:  Neurology  Procedures:  None  Antibiotics:  Anti-infectives (From admission, onward)   None       Subjective: Today, patient appears to be confused.  No meaningful conversation could be performed.  Objective: Vitals:   01/09/20 0348 01/09/20 0900  BP: (!) 163/75 (!) 157/65  Pulse: 61 61  Resp: 18 20  Temp: 98.2 F (36.8 C) 98.4 F (36.9 C)  SpO2: 97% 96%    Intake/Output Summary (Last 24 hours) at 01/09/2020 1005 Last data filed at 01/09/2020 0553 Gross per 24 hour  Intake 2665.59 ml  Output 1740 ml  Net 925.59 ml   Filed Weights   01/08/20 1530  Weight: 88.7 kg   Body mass index is 26.52 kg/m.   Physical Exam: GENERAL: Patient is able to open eyes and track but non verbal at the time of my evaluation.  Not in obvious distress. HENT: No scleral pallor or icterus. Pupils equally reactive to light. Oral mucosa is moist NECK: is supple, no palpable thyroid enlargement. CHEST: Clear to auscultation. No crackles or wheezes. Non tender on palpation. Diminished breath sounds bilaterally. CVS: S1 and S2 heard, no murmur. Regular rate and rhythm. No pericardial rub. ABDOMEN: Soft, non-tender, bowel sounds are present. EXTREMITIES: Trace bilateral lower extremity edema..  On mittens.  Left great toe redness.  Left sided weakness more than the right.  Increased muscle tone. CNS: Alert awake tracking but unable to verbalize, baseline dementia. SKIN: warm and dry without rashes.  Data Review: I have personally reviewed the following laboratory data and studies,  CBC: Recent Labs  Lab 01/07/20 1814 01/07/20 1835    WBC 10.4  --   NEUTROABS 7.8*  --   HGB 14.8 14.6  HCT 45.4 43.0  MCV 93.0  --   PLT 151  --    Basic Metabolic Panel: Recent Labs  Lab 01/07/20 1814 01/07/20 1835  NA 137 139  K 3.9 3.9  CL 103  --   CO2 26  --   GLUCOSE 123*  --   BUN 17  --   CREATININE 1.40*  --   CALCIUM 8.3*  --    Liver Function Tests: Recent Labs  Lab 01/07/20 1814 01/08/20 0540  AST 20  --   ALT 24  --   ALKPHOS 74  --   BILITOT 1.9* 1.8*  PROT 6.3*  --   ALBUMIN 3.2*  --    No results for input(s): LIPASE, AMYLASE in the last 168 hours. No results for input(s): AMMONIA in the last 168 hours. Cardiac Enzymes: No results for input(s): CKTOTAL, CKMB, CKMBINDEX, TROPONINI in the last 168 hours. BNP (last 3 results) No results for input(s): BNP in the last 8760 hours.  ProBNP (last 3 results) No results for input(s): PROBNP in the last 8760 hours.  CBG: Recent Labs  Lab 01/07/20 1641 01/08/20 1232 01/08/20 2130  GLUCAP 92 93 104*   Recent Results (from  the past 240 hour(s))  SARS CORONAVIRUS 2 (TAT 6-24 HRS) Nasopharyngeal Nasopharyngeal Swab     Status: None   Collection Time: 01/07/20  5:10 PM   Specimen: Nasopharyngeal Swab  Result Value Ref Range Status   SARS Coronavirus 2 NEGATIVE NEGATIVE Final    Comment: (NOTE) SARS-CoV-2 target nucleic acids are NOT DETECTED. The SARS-CoV-2 RNA is generally detectable in upper and lower respiratory specimens during the acute phase of infection. Negative results do not preclude SARS-CoV-2 infection, do not rule out co-infections with other pathogens, and should not be used as the sole basis for treatment or other patient management decisions. Negative results must be combined with clinical observations, patient history, and epidemiological information. The expected result is Negative. Fact Sheet for Patients: HairSlick.no Fact Sheet for Healthcare  Providers: quierodirigir.com This test is not yet approved or cleared by the [place] FDA and  has been authorized for detection and/or diagnosis of SARS-CoV-2 by FDA under an Emergency Use Authorization (EUA). This EUA will remain  in effect (meaning this test can be used) for the duration of the COVID-19 declaration under Section 56 4(b)(1) of the Act, 21 U.S.C. section 360bbb-3(b)(1), unless the authorization is terminated or revoked sooner. Performed at [facility], [address] [place], [place] [address]   Culture, blood (Routine X 2) w Reflex to ID Panel     Status: None (Preliminary result)   Collection Time: 01/08/20  6:05 PM   Specimen: BLOOD  Result Value Ref Range Status   Specimen Description BLOOD LEFT ANTECUBITAL  Final   Special Requests   Final    BOTTLES DRAWN AEROBIC AND ANAEROBIC Blood Culture adequate volume   Culture   Final    NO GROWTH < 24 HOURS Performed at [facility], [address] [place], [place] [address]    Report Status PENDING  Incomplete  Culture, blood (Routine X 2) w Reflex to ID Panel     Status: None (Preliminary result)   Collection Time: 01/08/20  6:15 PM   Specimen: BLOOD RIGHT ARM  Result Value Ref Range Status   Specimen Description BLOOD RIGHT ARM  Final   Special Requests   Final    BOTTLES DRAWN AEROBIC AND ANAEROBIC Blood Culture results may not be optimal due to an inadequate volume of blood received in culture bottles   Culture   Final    NO GROWTH < 24 HOURS Performed at [facility], [address] [place], [place] [address]    Report Status PENDING  Incomplete     Studies: DG Chest 1 View  Result Date: 01/07/2020 CLINICAL DATA:  Left side weakness EXAM: CHEST  1 VIEW COMPARISON:  10/08/2009 FINDINGS: Cardiomegaly. No confluent opacities, effusions or edema. No acute bony abnormality. IMPRESSION: Cardiomegaly.  No active disease. Electronically Signed   By: Charlett Nose  M.D.   On: 01/07/2020 17:50   DG Abdomen 1 View  Result Date: 01/07/2020 CLINICAL DATA:  Cough EXAM: ABDOMEN - 1 VIEW COMPARISON:  07/15/2014 FINDINGS: Prior cholecystectomy. Nonobstructive bowel gas pattern. No organomegaly or free air. IMPRESSION: No acute findings. Electronically Signed   By: Charlett Nose M.D.   On: 01/07/2020 17:50   CT Head Wo Contrast  Result Date: 01/07/2020 CLINICAL DATA:  84 year old male with history of Alzheimer's and multiple falls. EXAM: CT HEAD WITHOUT CONTRAST TECHNIQUE: Contiguous axial images were obtained from the base of the skull through the vertex without intravenous contrast. COMPARISON:  None. FINDINGS:  Brain: There is moderate age-related atrophy and chronic microvascular ischemic changes. Bilateral frontal hypodensities with loss of gray-white matter discrimination (series 2, image 23 and 25), likely chronic. There is no acute intracranial hemorrhage. No mass effect or midline shift. No extra-axial fluid collection. Vascular: No hyperdense vessel or unexpected calcification. Skull: Normal. Negative for fracture or focal lesion. Sinuses/Orbits: Mild mucoperiosteal thickening of paranasal sinuses. No air-fluid level. The mastoid air cells are clear. Other: None IMPRESSION: 1. No acute intracranial hemorrhage. 2. Moderate age-related atrophy and chronic microvascular ischemic changes. Multifocal hypodensities with loss of gray-white matter discrimination, likely chronic. If there is high clinical concern for acute infarct further evaluation with MRI is recommended. Electronically Signed   By: Elgie CollardArash  Radparvar M.D.   On: 01/07/2020 17:42   MR ANGIO HEAD WO CONTRAST  Result Date: 01/08/2020 CLINICAL DATA:  Stroke.  Left-sided weakness EXAM: MRA HEAD WITHOUT CONTRAST TECHNIQUE: Angiographic images of the Circle of Willis were obtained using MRA technique without intravenous contrast. COMPARISON:  MRI head 01/07/2000 FINDINGS: Severe stenosis distal left vertebral  artery V4 segment. Right vertebral artery patent. PICA patent bilaterally with the origins not imaged. Mild atherosclerotic disease in the basilar without stenosis. Moderate stenosis right P2. Mild stenosis left P2. Cavernous carotid widely patent bilaterally.  Negative for aneurysm. Occlusion of the right A2 segment corresponding to right anterior cerebral artery infarct. Right middle cerebral artery patent. Multiple areas of irregularity and stenosis right M2 and M3 branches. Left anterior cerebral artery patent with mild atherosclerotic disease. Left middle cerebral artery patent with multiple areas of atherosclerotic irregularity and stenosis left M2 and M3 branches. IMPRESSION: 1. Occlusion of the right A2 segment corresponding to right anterior cerebral artery infarct. 2. Moderate to advanced intracranial atherosclerotic disease as above. Electronically Signed   By: Marlan Palauharles  Clark M.D.   On: 01/08/2020 08:33   MR BRAIN WO CONTRAST  Addendum Date: 01/07/2020   ADDENDUM REPORT: 01/07/2020 23:10 ADDENDUM: Study discussed by telephone with Dr. Mancel BaleELLIOTT WENTZ on 01/07/2020 at 23:10 . Electronically Signed   By: Odessa FlemingH  Hall M.D.   On: 01/07/2020 23:10   Result Date: 01/07/2020 CLINICAL DATA:  84 year old male with dimension multiple falls. Left side weakness for 1 week. EXAM: MRI HEAD WITHOUT CONTRAST TECHNIQUE: Multiplanar, multiecho pulse sequences of the brain and surrounding structures were obtained without intravenous contrast. COMPARISON:  Head CT earlier today. Brain MRI 01/31/2005. FINDINGS: Brain: Widely scattered and multifocal confluent areas of restricted diffusion in the right ACA territory affecting the right cingulate and superior frontal gyrus plus some of the body of the corpus callosum (series 4, image 23). Associated cytotoxic edema with T2 and FLAIR hyperintensity. There is petechial hemorrhage in the right superior frontal gyrus on SWI series 6, image 87. No associated mass effect. Contralateral  chronic encephalomalacia in the left ACA territory. No other restricted diffusion. Substantially decreased cerebral volume since 2006 and widespread, confluent bilateral cerebral white matter T2 and FLAIR hyperintensity now. No midline shift, mass effect, evidence of mass lesion, ventriculomegaly, extra-axial collection. Cervicomedullary junction and pituitary are within normal limits. No other cortical encephalomalacia identified. The deep gray matter nuclei also seem relatively spared, there are some small chronic lacunar infarcts of the anterior right basal ganglia. Brainstem and cerebellum appear within normal limits for age. Vascular: Major intracranial vascular flow voids are stable since 2006. Skull and upper cervical spine: Negative visible cervical spine. Visualized bone marrow signal is within normal limits. Sinuses/Orbits: Postoperative changes to both globes otherwise negative orbits. Stable paranasal  sinus and mastoid pneumatization. Other: Visible internal auditory structures appear negative. Scalp and face soft tissues appear negative. IMPRESSION: 1. Confluent Acute Right ACA territory infarcts with mild petechial hemorrhage but no malignant hemorrhagic transformation or mass effect. 2. Chronic left ACA territory infarct. 3. Substantially decreased cerebral volume since a 2006 MRI with advanced but nonspecific cerebral white matter signal changes since that time. Electronically Signed: By: Genevie Ann M.D. On: 01/07/2020 23:05   DG Foot 2 Views Left  Result Date: 01/08/2020 CLINICAL DATA:  Localized swelling and redness of the left foot new onset left-sided weakness and paralysis. EXAM: LEFT FOOT - 2 VIEW COMPARISON:  None. FINDINGS: No acute fracture or traumatic malalignment is evident. There is focal severe arthrosis at the first metatarsophalangeal joint with extensive sclerotic features and bony remodeling. Periarticular osteophyte formations are noted as well as mild soft tissue swelling.  Additional degenerative changes throughout the left foot are more mild. Questionable lucency along the anterolateral corner of the cuboid could reflect a small marginal erosion. 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 Recommend correlation with mobility to assess for hallux limitus. Questionable erosion along the anterolateral corner of the cuboid, could reflect some underlying arthropathy. Correlation with exam findings and serologies could be considered. Electronically Signed   By: Lovena Le M.D.   On: 01/08/2020 19:01   ECHOCARDIOGRAM COMPLETE  Result Date: 01/08/2020   ECHOCARDIOGRAM REPORT   Patient Name:   Kyle [name] Date of Exam: 01/08/2020 Medical Rec #:  [id number]       Height:       72.0 in Accession #:    [id number]      Weight:       197.3 lb Date of Birth:  07-07-1936        BSA:          2.12 m Patient Age:    89 years        BP:           148/71 mmHg Patient Gender: M               HR:           70 bpm. Exam Location:  Inpatient Procedure: 2D Echo, Cardiac Doppler and Color Doppler Indications:    Stroke 434.91  History:        Patient has no prior history of Echocardiogram examinations.                 Stroke; Risk Factors:Non-Smoker.  Sonographer:    Paulita Fujita RDCS Referring Phys: [id number] Intercourse  1. Left ventricular ejection fraction, by visual estimation, is 55 to 60%. The left ventricle has normal function. There is no left ventricular hypertrophy.  2. The left ventricle has no regional wall motion abnormalities.  3. Global right ventricle has normal systolic function.The right ventricular size is normal. No increase in right ventricular wall thickness.  4. Left atrial size was normal.  5. Right atrial size was normal.  6. The mitral valve is  normal in structure. No evidence of mitral valve regurgitation. No evidence of mitral stenosis.  7. The tricuspid valve is normal in structure.  8. The aortic valve is normal in structure. Aortic valve regurgitation is mild. No evidence of aortic valve sclerosis or stenosis.  9.  The pulmonic valve was normal in structure. Pulmonic valve regurgitation is not visualized. 10. Mildly elevated pulmonary artery systolic pressure. 11. The tricuspid regurgitant velocity is 2.80 m/s, and with an assumed right atrial pressure of 3 mmHg, the estimated right ventricular systolic pressure is mildly elevated at 34.4 mmHg. 12. The inferior vena cava is normal in size with greater than 50% respiratory variability, suggesting right atrial pressure of 3 mmHg. FINDINGS  Left Ventricle: Left ventricular ejection fraction, by visual estimation, is 55 to 60%. The left ventricle has normal function. The left ventricle has no regional wall motion abnormalities. The left ventricular internal cavity size was the left ventricle is normal in size. There is no left ventricular hypertrophy. Left ventricular diastolic parameters were normal. Normal left atrial pressure. Right Ventricle: The right ventricular size is normal. No increase in right ventricular wall thickness. Global RV systolic function is has normal systolic function. The tricuspid regurgitant velocity is 2.80 m/s, and with an assumed right atrial pressure  of 3 mmHg, the estimated right ventricular systolic pressure is mildly elevated at 34.4 mmHg. Left Atrium: Left atrial size was normal in size. Right Atrium: Right atrial size was normal in size Pericardium: There is no evidence of pericardial effusion. Mitral Valve: The mitral valve is normal in structure. No evidence of mitral valve regurgitation. No evidence of mitral valve stenosis by observation. Tricuspid Valve: The tricuspid valve is normal in structure. Tricuspid valve regurgitation is trivial. Aortic Valve: The aortic  valve is normal in structure. Aortic valve regurgitation is mild. The aortic valve is structurally normal, with no evidence of sclerosis or stenosis. Pulmonic Valve: The pulmonic valve was normal in structure. Pulmonic valve regurgitation is not visualized. Pulmonic regurgitation is not visualized. Aorta: The aortic root, ascending aorta and aortic arch are all structurally normal, with no evidence of dilitation or obstruction. Venous: The inferior vena cava is normal in size with greater than 50% respiratory variability, suggesting right atrial pressure of 3 mmHg. IAS/Shunts: No atrial level shunt detected by color flow Doppler. There is no evidence of a patent foramen ovale. No ventricular septal defect is seen or detected. There is no evidence of an atrial septal defect.  LEFT VENTRICLE PLAX 2D LVIDd:         4.70 cm       Diastology LVIDs:         3.39 cm       LV e' lateral:   9.79 cm/s LV PW:         0.83 cm       LV E/e' lateral: 7.3 LV IVS:        0.81 cm       LV e' medial:    6.20 cm/s LVOT diam:     1.80 cm       LV E/e' medial:  11.5 LV SV:         55 ml LV SV Index:   25.76 LVOT Area:     2.54 cm  LV Volumes (MOD) LV area d, A2C:    34.30 cm LV area d, A4C:    33.90 cm LV area s, A2C:    19.00 cm LV area s, A4C:    22.80 cm LV major d, A2C:   7.88 cm LV major d, A4C:   7.50 cm LV major s, A2C:   6.22 cm LV major s, A4C:   6.54 cm LV vol d, MOD A2C: 126.0 ml LV vol d, MOD A4C: 128.0 ml  LV vol s, MOD A2C: 51.5 ml LV vol s, MOD A4C: 69.3 ml LV SV MOD A2C:     74.5 ml LV SV MOD A4C:     128.0 ml LV SV MOD BP:      68.7 ml RIGHT VENTRICLE RV S prime:     16.50 cm/s TAPSE (M-mode): 1.9 cm LEFT ATRIUM             Index       RIGHT ATRIUM           Index LA diam:        3.00 cm 1.42 cm/m  RA Area:     15.30 cm LA Vol (A2C):   42.6 ml 20.11 ml/m RA Volume:   37.70 ml  17.80 ml/m LA Vol (A4C):   40.3 ml 19.02 ml/m LA Biplane Vol: 43.4 ml 20.49 ml/m  AORTIC VALVE LVOT Vmax:   120.00 cm/s LVOT Vmean:   75.300 cm/s LVOT VTI:    0.246 m  AORTA Ao Root diam: 3.20 cm MITRAL VALVE                        TRICUSPID VALVE MV Area (PHT): 2.99 cm             TR Peak grad:   31.4 mmHg MV PHT:        73.66 msec           TR Vmax:        280.00 cm/s MV Decel Time: 254 msec MV E velocity: 71.60 cm/s 103 cm/s  SHUNTS MV A velocity: 80.60 cm/s 70.3 cm/s Systemic VTI:  0.25 m MV E/A ratio:  0.89       1.5       Systemic Diam: 1.80 cm  Mihai Croitoru MD Electronically signed by Thurmon Fair MD Signature Date/Time: 01/08/2020/4:58:36 PM    Final     Scheduled Meds: . enoxaparin (LOVENOX) injection  40 mg Subcutaneous Q24H  . memantine  10 mg Oral BID  . rivastigmine  4.6 mg Transdermal Daily    Continuous Infusions: . sodium chloride 75 mL/hr at 01/08/20 1725     Joycelyn Das, MD  [facility] 01/09/2020

## 2020-01-09 NOTE — Progress Notes (Signed)
Occupational Therapy Evaluation Patient Details Name: Kyle [name] MRN: [id number] DOB: 02-15-1936 Today's Date: 01/09/2020    History of Present Illness Kyle [name] is a 84 y.o. male past medical history of advanced dementia. Presented to the hospital for the frequent falls and left leg paralysis and worsening of the left leg weakness and 7-8 days of decreased verbal output. 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   Clinical Impression   PTA, pt was living at home with his wife, who was assisting with ADL/IADL and pt was independent with functional mobility until 2 weeks ago. Pt currently requires maxA+2-totalA+2 for ADL, bed mobility, and attempt to stand. He demonstrates decreased cognition, following <10% of simple 1 step commands, left inattention, left hemiplegia, and poor sitting balance, requiring minA-maxA for stability sitting EOB. Due to decline in current level of function, pt would benefit from acute OT to address established goals to facilitate safe D/C to venue listed below. At this time, recommend SNF follow-up. Wife reports she would prefer pt d/c to [facility]. Will continue to follow acutely.     Follow Up Recommendations  SNF;Supervision/Assistance - 24 hour    Equipment Recommendations  3 in 1 bedside commode;Hospital bed    Recommendations for Other Services       Precautions / Restrictions Precautions Precautions: Fall;Other (comment) Precaution Comments: L hemiplegia, inattention Restrictions Weight Bearing Restrictions: No      Mobility Bed Mobility Overal bed mobility: Needs Assistance Bed Mobility: Rolling;Sidelying to Sit;Sit to Sidelying Rolling: Max assist Sidelying to sit: Total assist;+2 for physical assistance     Sit to sidelying: Max assist;+2 for physical assistance General bed mobility comments: Pt requiring max-totalA + 2 for bed mobility, sitting up towards left side of bed. Cues for  use of bed rail but decreased functional use  Transfers Overall transfer level: Needs assistance Equipment used: None Transfers: Sit to/from Stand Sit to Stand: Total assist;+2 physical assistance         General transfer comment: TotalA + 2 to boost hips up with use of bed pad, left knee blocked. Pt with crouched posture and unable to achieve upright positioning    Balance Overall balance assessment: Needs assistance Sitting-balance support: Feet supported;Single extremity supported Sitting balance-Leahy Scale: Poor Sitting balance - Comments: Requiring min-maxA for sitting balance edge of bed; tendency for posterior and right lateral lean. Poor trunk control Postural control: Posterior lean;Right lateral lean   Standing balance-Leahy Scale: Zero                             ADL either performed or assessed with clinical judgement   ADL Overall ADL's : Needs assistance/impaired Eating/Feeding: Maximal assistance   Grooming: Maximal assistance;Cueing for sequencing;Total assistance Grooming Details (indicate cue type and reason): once toothbrush(dry) was in his mouth he initiated brushing back and forth, totalA for thorough completion  Upper Body Bathing: Total assistance   Lower Body Bathing: Total assistance;+2 for physical assistance   Upper Body Dressing : Total assistance   Lower Body Dressing: Total assistance;+2 for physical assistance   Toilet Transfer: Total assistance;+2 for physical assistance Toilet Transfer Details (indicate cue type and reason): pt unable to achieve fully upright standing;required totalA+2 for rolling Toileting- Clothing Manipulation and Hygiene: Total assistance       Functional mobility during ADLs: Maximal assistance;Total assistance;+2 for physical assistance;+2 for safety/equipment General ADL Comments: maxA+2-totalA+2 for all ADL;pt limited  by L inattention, cognition, and L sided weakness     Vision Baseline  Vision/History: Wears glasses Wears Glasses: Reading only Vision Assessment?: Vision impaired- to be further tested in functional context Additional Comments: difficult to assess due to congition, visually tracks to midline and in R visual field, decreased visual attention;requires maxA with head turn to look to L visual field, maintains focus for about 3seconds     Perception     Praxis      Pertinent Vitals/Pain Pain Assessment: Faces Faces Pain Scale: Hurts a little bit Pain Location: grimacing with repositioning Pain Descriptors / Indicators: Grimacing Pain Intervention(s): Monitored during session     Hand Dominance Right   Extremity/Trunk Assessment Upper Extremity Assessment Upper Extremity Assessment: LUE deficits/detail;Difficult to assess due to impaired cognition LUE Deficits / Details: difficult to fully assess due to cognition;shoulder flexion limited about 80* AROM, PROM about 100*;pt appears to have LUE inattention, with toothbrush placed in L hand pt attempts to brush teeth with RUE;RUE WNL;utilized LUE for bilateral coordination task to open toothpaste, did not use LUE to place toothpaste on toothbrush *of note, simulated teeth brushing was done with a dry toothbrush due to SLP restrictions, followed by application of toothpaste to toothbrush, pt did not use toothpaste in mouth LUE Sensation: decreased light touch;decreased proprioception LUE Coordination: decreased fine motor;decreased gross motor   Lower Extremity Assessment Lower Extremity Assessment: Defer to PT evaluation RLE Deficits / Details: 3/5 LLE Deficits / Details: 0/5   Cervical / Trunk Assessment Cervical / Trunk Assessment: Other exceptions Cervical / Trunk Exceptions: forward head posture   Communication Communication Communication: No difficulties   Cognition Arousal/Alertness: Awake/alert Behavior During Therapy: WFL for tasks assessed/performed Overall Cognitive Status: History of cognitive  impairments - at baseline                                 General Comments: History of advanced dementia at baseline; following <25% of one step commands with max multimodal cues;when prompted with toothbrush pt able to correctly name;did not appropriately use toothbrush required totalA to place it in mouth before initiating brushing;when provided with toothpaste pt able to open container   General Comments  wife present during session, educated her on pt's current level of functioning and strategies to increase awareness to [place]    Exercises     Shoulder Instructions      Home Living Family/patient expects to be discharged to:: Private residence Living Arrangements: Spouse/significant other Available Help at Discharge: Family;Available 24 hours/day Type of Home: House Home Access: Stairs to enter [employer] of Steps: 3 Entrance Stairs-Rails: Right;Left Home Layout: Two level;Able to live on main level with bedroom/bathroom Alternate Level Stairs-Number of Steps: pt and wife do not use upstairs   Bathroom Shower/Tub: Tub/shower unit         Home Equipment: Emergency planning/management officer - 2 wheels;Wheelchair - manual;Bedside commode   Additional Comments: increased frequency of falls;son lives nearby;wife unable to provide physical assistance  Lives With: Spouse    Prior Functioning/Environment Level of Independence: Needs assistance  Gait / Transfers Assistance Needed: pt was ambulating without physical assistance/AD until the past two weeks ;assists getting in and out of shower ADL's / Homemaking Assistance Needed: requires assistance for grooming (shaving) dressing, medication managment, cooking from wife Communication / Swallowing Assistance Needed: advanced dementia          OT Problem List: Decreased strength;Decreased range of motion;Decreased  activity tolerance;Impaired balance (sitting and/or standing);Impaired vision/perception;Decreased  coordination;Decreased cognition;Decreased safety awareness;Decreased knowledge of use of DME or AE;Decreased knowledge of precautions;Impaired UE functional use;Pain      OT Treatment/Interventions: Self-care/ADL training;Therapeutic exercise;Neuromuscular education;DME and/or AE instruction;Therapeutic activities;Cognitive remediation/compensation;Patient/family education;Balance training;Visual/perceptual remediation/compensation    OT Goals(Current goals can be found in the care plan section) Acute Rehab OT Goals Patient Stated Goal: pt wife would like him to go to [employer] OT Goal Formulation: With family Time For Goal Achievement: 01/23/20 Potential to Achieve Goals: Fair ADL Goals Pt Will Perform Eating: with min assist;sitting Pt Will Perform Grooming: with min assist;sitting Pt Will Transfer to Toilet: with min assist;stand pivot transfer Additional ADL Goal #1: Pt will utilize LUE with minA for initiation during ADL completion. Additional ADL Goal #2: Pt will visually attend to L side of environment with min cues during ADL and functional mobiltiy.  OT Frequency: Min 2X/week   Barriers to D/C: Decreased caregiver support  wife is unable to provide appropriate level of care       Co-evaluation PT/OT/SLP Co-Evaluation/Treatment: Yes Reason for Co-Treatment: For patient/therapist safety;To address functional/ADL transfers PT goals addressed during session: Mobility/safety with mobility;Balance OT goals addressed during session: ADL's and self-care      AM-PAC OT "6 Clicks" Daily Activity     Outcome Measure Help from another person eating meals?: A Lot Help from another person taking care of personal grooming?: Total Help from another person toileting, which includes using toliet, bedpan, or urinal?: Total Help from another person bathing (including washing, rinsing, drying)?: Total Help from another person to put on and taking off regular upper body clothing?:  Total Help from another person to put on and taking off regular lower body clothing?: Total 6 Click Score: 7   End of Session Equipment Utilized During Treatment: Gait belt Nurse Communication: Mobility status  Activity Tolerance: Patient tolerated treatment well Patient left: in bed;with bed alarm set;with call bell/phone within reach  OT Visit Diagnosis: Unsteadiness on feet (R26.81);Other abnormalities of gait and mobility (R26.89);Muscle weakness (generalized) (M62.81);History of falling (Z91.81);Feeding difficulties (R63.3);Other symptoms and signs involving cognitive function;Adult, failure to thrive (R62.7)                Time: 5176-1607 OT Time Calculation (min): 32 min Charges:  OT General Charges $OT Visit: 1 Visit OT Evaluation $OT Eval Moderate Complexity: 1 Mod  Diona Browner OTR/L Acute Rehabilitation Services Office: [phone]   Rebeca Alert 01/09/2020, 11:43 AM

## 2020-01-09 NOTE — NC FL2 (Signed)
[place] MEDICAID FL2 LEVEL OF CARE SCREENING TOOL     IDENTIFICATION  Patient Name: Kyle [name] Birthdate: [birth year] Sex: male Admission Date (Current Location): 01/07/2020  [facility] and [place] Number:  Producer, television/film/video and Address:  [facility] [place], [address] [place], [place] [address]      Provider Number: [id number]  Attending Physician Name and Address:  Joycelyn Das, MD  Relative Name and Phone Number:       Current Level of Care: Hospital Recommended Level of Care: Skilled Nursing Facility Prior Approval Number:    Date Approved/Denied:   PASRR Number: [id number]  Discharge Plan: SNF    Current Diagnoses: Patient Active Problem List   Diagnosis Date Noted  . Stroke (HCC) 01/08/2020  . Serum total bilirubin elevated 01/08/2020  . Alzheimer disease (HCC) 09/25/2019  . Memory difficulty 04/01/2017    Orientation RESPIRATION BLADDER Height & Weight     Self  Normal Incontinent Weight: 195 lb 8.8 oz (88.7 kg) Height:  6' (182.9 cm)  BEHAVIORAL SYMPTOMS/MOOD NEUROLOGICAL BOWEL NUTRITION STATUS      Incontinent Diet(see DC summary)  AMBULATORY STATUS COMMUNICATION OF NEEDS Skin   Extensive Assist Verbally Normal                       Personal Care Assistance Level of Assistance  Bathing, Feeding, Dressing Bathing Assistance: Maximum assistance Feeding assistance: Limited assistance Dressing Assistance: Maximum assistance     Functional Limitations Info             SPECIAL CARE FACTORS FREQUENCY  PT (By licensed PT), OT (By licensed OT), Speech therapy     PT Frequency: 5x/wk OT Frequency: 5x/wk     Speech Therapy Frequency: 5x/wk      Contractures Contractures Info: Not present    Additional Factors Info  Code Status, Allergies Code Status Info: DNR Allergies Info: Demerol           Current Medications (01/09/2020):  This is the current hospital active medication list Current  Facility-Administered Medications  Medication Dose Route Frequency Provider Last Rate Last Admin  . 0.9 %  sodium chloride infusion   Intravenous Continuous Marvel Plan, MD 75 mL/hr at 01/08/20 1725 Rate Change at 01/08/20 1725  . acetaminophen (TYLENOL) tablet 650 mg  650 mg Oral Q4H PRN John Giovanni, MD       Or  . acetaminophen (TYLENOL) 160 MG/5ML solution 650 mg  650 mg Per Tube Q4H PRN John Giovanni, MD       Or  . acetaminophen (TYLENOL) suppository 650 mg  650 mg Rectal Q4H PRN John Giovanni, MD      . Melene Muller ON 01/10/2020] aspirin EC tablet 81 mg  81 mg Oral Daily Annie Main L, NP      . clopidogrel (PLAVIX) tablet 75 mg  75 mg Oral Daily Biby, Sharon L, NP      . enoxaparin (LOVENOX) injection 40 mg  40 mg Subcutaneous Q24H Marvel Plan, MD   40 mg at 01/08/20 1804  . memantine (NAMENDA) tablet 10 mg  10 mg Oral BID John Giovanni, MD   Stopped at 01/08/20 [phone]  . rivastigmine (EXELON) 4.6 mg/24hr 4.6 mg  4.6 mg Transdermal Daily John Giovanni, MD   4.6 mg at 01/09/20 0852  . senna-docusate (Senokot-S) tablet 1 tablet  1 tablet Oral QHS PRN John Giovanni, MD      . tuberculin injection 5 Units  5 Units  Intradermal Once Flora Lipps, MD         Discharge Medications: Please see discharge summary for a list of discharge medications.  Relevant Imaging Results:  Relevant Lab Results:   Additional Information SS#: [id number]  Geralynn Ochs, LCSW

## 2020-01-09 NOTE — TOC Initial Note (Signed)
Transition of Care [facility]) - Initial/Assessment Note    Patient Details  Name: Kyle [name] MRN: [id number] Date of Birth: 21-Jul-1936  Transition of Care [facility]) CM/SW Contact:    Geralynn Ochs, LCSW Phone Number: 01/09/2020, 4:10 PM  Clinical Narrative:    CSW met with wife at bedside to discuss recommendation for SNF. Wife indicated that the family has been trying to get the patient into [facility], and to reach out to their son, Stormy Card, who has been working on that. CSW received permission to reach out to [facility] directly to ask about admission. CSW contacted [facility], spoke with Claudette Head about the patient's situation. Claudette Head asked for referral information, and CSW faxed clinical updates on patient. Claudette Head reviewed and called back that they think patient would best benefit from SNF short term prior to admission to memory care, especially after having a stroke. CSW contacted patient's son, Stormy Card, to provide update, and Rusty in agreement to pursue SNF. CSW completed referral and faxed out. CSW to update family with bed offers after received.               Expected Discharge Plan: Long Term Nursing Home Barriers to Discharge: Continued Medical Work up, Ship broker   Patient Goals and CMS Choice Patient states their goals for this hospitalization and ongoing recovery are:: patient unable to state goals due to advanced dementia CMS Medicare.gov Compare Post Acute Care list provided to:: Patient Represenative (must comment) Choice offered to / list presented to : Spouse  Expected Discharge Plan and Services Expected Discharge Plan: [place]     Post Acute Care Choice: Nursing Home Living arrangements for the past 2 months: Single Family Home                                      Prior Living Arrangements/Services Living arrangements for the past 2 months: Single Family Home Lives with:: Spouse Patient language and need for  interpreter reviewed:: No Do you feel safe going back to the place where you live?: Yes      Need for Family Participation in Patient Care: Yes (Comment) Care giver support system in place?: No (comment) Current home services: DME Criminal Activity/Legal Involvement Pertinent to Current Situation/Hospitalization: No - Comment as needed  Activities of Daily Living      Permission Sought/Granted Permission sought to share information with : Facility Sport and exercise psychologist, Family Supports Permission granted to share information with : Yes, Verbal Permission Granted  Share Information with NAME: Deloris, Rusty  Permission granted to share info w AGENCY: [place], SNF  Permission granted to share info w Relationship: Wife, Son/HCPOA     Emotional Assessment Appearance:: Appears stated age Attitude/Demeanor/Rapport: Unable to Assess Affect (typically observed): Unable to Assess Orientation: : Oriented to Self Alcohol / Substance Use: Not Applicable Psych Involvement: No (comment)  Admission diagnosis:  Stroke ([place]) [I63.9] Fever [R50.9] Acute CVA (cerebrovascular accident) [facility]) [I63.9] Patient Active Problem List   Diagnosis Date Noted  . Stroke ([place]) 01/08/2020  . Serum total bilirubin elevated 01/08/2020  . Alzheimer disease ([place]) 09/25/2019  . Memory difficulty 04/01/2017   PCP:  Lorene Dy, MD Pharmacy:   [facility], [place] [address] Phone: [phone] Fax: [place], [place]  [facility] [address] [facility] [address] [place] [address] Phone: [phone] Fax: [phone]     Social Determinants of Health (SDOH) Interventions    Readmission Risk Interventions No flowsheet data found.  

## 2020-01-09 NOTE — Evaluation (Signed)
Physical Therapy Evaluation Patient Details Name: Kyle [name] MRN: [id number] DOB: Jun 02, 1936 Today's Date: 01/09/2020   History of Present Illness  BEKIM WERNTZ is a 84 y.o. male past medical history of advanced dementia. Presented to the hospital for the frequent falls and left leg paralysis and worsening of the left leg weakness and 7-8 days of decreased verbal output. 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  Clinical Impression  Pt admitted with above. Pt presents with decreased functional mobility secondary to decreased cognition, left inattention, left hemiplegia (lower extremity weaker than upper), decreased coordination, poor sitting balance. Pt requiring significant two person assist for bed mobility and unable to stand. Will need post acute rehab to address deficits, maximize functional mobility, and decrease caregiver burden.    Follow Up Recommendations SNF;Supervision/Assistance - 24 hour (pt wife preferring [facility])    [facility] bed;Other (comment)(hoyer lift)    Recommendations for Other Services       Precautions / Restrictions Precautions Precautions: Fall;Other (comment) Precaution Comments: L hemiplegia, inattention Restrictions Weight Bearing Restrictions: No      Mobility  Bed Mobility Overal bed mobility: Needs Assistance Bed Mobility: Rolling;Sidelying to Sit;Sit to Sidelying Rolling: Max assist Sidelying to sit: Total assist;+2 for physical assistance     Sit to sidelying: Max assist;+2 for physical assistance General bed mobility comments: Pt requiring max-totalA + 2 for bed mobility, sitting up towards left side of bed. Cues for use of bed rail but decreased functional use  Transfers Overall transfer level: Needs assistance Equipment used: None Transfers: Sit to/from Stand Sit to Stand: Total assist;+2 physical assistance         General transfer comment:  TotalA + 2 to boost hips up with use of bed pad, left knee blocked. Pt with crouched posture and unable to achieve upright positioning  Ambulation/Gait                Stairs            Wheelchair Mobility    Modified Rankin (Stroke Patients Only) Modified Rankin (Stroke Patients Only) Pre-Morbid Rankin Score: Moderately severe disability Modified Rankin: Severe disability     Balance Overall balance assessment: Needs assistance Sitting-balance support: Feet supported;Single extremity supported Sitting balance-Leahy Scale: Poor Sitting balance - Comments: Requiring min-maxA for sitting balance edge of bed; tendency for posterior and right lateral lean. Poor trunk control Postural control: Posterior lean;Right lateral lean   Standing balance-Leahy Scale: Zero                               Pertinent Vitals/Pain Pain Assessment: Faces Faces Pain Scale: Hurts a little bit Pain Location: grimacing with repositioning Pain Descriptors / Indicators: Grimacing Pain Intervention(s): Monitored during session;Repositioned    Home Living Family/patient expects to be discharged to:: Private residence Living Arrangements: Spouse/significant other Available Help at Discharge: Family;Available 24 hours/day Type of Home: House Home Access: Stairs to enter Entrance Stairs-Rails: Psychiatric nurse of Steps: 3 Home Layout: Two level;Able to live on main level with bedroom/bathroom Home Equipment: Shower seat;Walker - 2 wheels;Wheelchair - manual;Bedside commode Additional Comments: increased frequency of falls;son lives nearby;wife unable to provide physical assistance    Prior Function Level of Independence: Needs assistance   Gait / Transfers Assistance Needed: pt was ambulating without physical assistance/AD until the past two weeks ;assists getting in and out of shower  ADL's /  Homemaking Assistance Needed: requires assistance for grooming  (shaving) dressing, medication managment, cooking from wife        Hand Dominance        Extremity/Trunk Assessment   Upper Extremity Assessment Upper Extremity Assessment: Defer to OT evaluation    Lower Extremity Assessment Lower Extremity Assessment: RLE deficits/detail;LLE deficits/detail RLE Deficits / Details: 3/5 LLE Deficits / Details: 0/5    Cervical / Trunk Assessment Cervical / Trunk Assessment: Other exceptions Cervical / Trunk Exceptions: forward head posture  Communication   Communication: No difficulties  Cognition Arousal/Alertness: Awake/alert Behavior During Therapy: WFL for tasks assessed/performed Overall Cognitive Status: History of cognitive impairments - at baseline                                 General Comments: History of advanced dementia at baseline; following <25% of one step commands with max multimodal cues      General Comments      Exercises     Assessment/Plan    PT Assessment Patient needs continued PT services  PT Problem List Decreased strength;Decreased range of motion;Decreased activity tolerance;Decreased balance;Decreased mobility;Decreased coordination;Decreased cognition;Decreased safety awareness       PT Treatment Interventions Gait training;Functional mobility training;Therapeutic activities;Therapeutic exercise;DME instruction;Balance training;Neuromuscular re-education;Cognitive remediation;Patient/family education;Wheelchair mobility training    PT Goals (Current goals can be found in the Care Plan section)  Acute Rehab PT Goals Patient Stated Goal: pt wife would like him to go to [facility] PT Goal Formulation: With patient/family Time For Goal Achievement: 01/23/20 Potential to Achieve Goals: Fair    Frequency Min 3X/week   Barriers to discharge        Co-evaluation PT/OT/SLP Co-Evaluation/Treatment: Yes Reason for Co-Treatment: For patient/therapist safety;To address functional/ADL  transfers PT goals addressed during session: Mobility/safety with mobility;Balance         AM-PAC PT "6 Clicks" Mobility  Outcome Measure Help needed turning from your back to your side while in a flat bed without using bedrails?: Total Help needed moving from lying on your back to sitting on the side of a flat bed without using bedrails?: Total Help needed moving to and from a bed to a chair (including a wheelchair)?: Total Help needed standing up from a chair using your arms (e.g., wheelchair or bedside chair)?: Total Help needed to walk in hospital room?: Total Help needed climbing 3-5 steps with a railing? : Total 6 Click Score: 6    End of Session Equipment Utilized During Treatment: Gait belt Activity Tolerance: Patient tolerated treatment well Patient left: in bed;with call bell/phone within reach;with bed alarm set;with restraints reapplied;with family/visitor present Nurse Communication: Mobility status PT Visit Diagnosis: Other abnormalities of gait and mobility (R26.89);Hemiplegia and hemiparesis;Other symptoms and signs involving the nervous system (R29.898) Hemiplegia - Right/Left: Left Hemiplegia - dominant/non-dominant: Non-dominant Hemiplegia - caused by: Cerebral infarction    Time: 1610-9604 PT Time Calculation (min) (ACUTE ONLY): 34 min   Charges:   PT Evaluation $PT Eval Moderate Complexity: 1 Mod          Laurina Bustle, PT, DPT Acute Rehabilitation Services Pager [phone] Office [phone]   Vanetta Mulders 01/09/2020, 10:45 AM

## 2020-01-09 NOTE — Evaluation (Signed)
Speech Language Pathology Evaluation Patient Details Name: Kyle [name] MRN: [id number] DOB: [birth year] Today's Date: 01/09/2020 Time: 8295-6213 SLP Time Calculation (min) (ACUTE ONLY): 20 min  Problem List:  Patient Active Problem List   Diagnosis Date Noted  . Stroke (HCC) 01/08/2020  . Serum total bilirubin elevated 01/08/2020  . Alzheimer disease (HCC) 09/25/2019  . Memory difficulty 04/01/2017   Past Medical History:  Past Medical History:  Diagnosis Date  . Alzheimer disease (HCC) 09/25/2019   Past Surgical History:  Past Surgical History:  Procedure Laterality Date  . APPENDECTOMY    . BACK SURGERY    . CHOLECYSTECTOMY    . HERNIA REPAIR     HPI:  84yo male admitted 01/07/20 with left side weakness x1 week. PMH: advanced dementia.  MRI = [facility]ent Acute Right ACA territory infarcts with mild petechial hemorrhage but no malignant hemorrhagic transformation or mass effect. Chronic left ACA territory infarct.   Assessment / Plan / Recommendation Clinical Impression  At baseline, pt has cognitive deficits dating back to 2017 per wife, with more rapid deterioration over the past 2 weeks. The mini mental state exam (MMSE) was administered. Pt scored 2/30, indicative of significant cognitive- linguistic deficits. 24 hour supervision is recommended at discharge. Skilled ST intervention recommended at next level of care to establish routines and provide education with wife.    SLP Assessment  SLP Recommendation/Assessment: All further Speech Language Pathology needs can be addressed in the next venue of care  SLP Visit Diagnosis: Cognitive communication deficit (R41.841)    Follow Up Recommendations  Skilled Nursing facility;24 hour supervision/assistance          SLP Evaluation Cognition  Overall Cognitive Status: History of cognitive impairments - at baseline Arousal/Alertness: Awake/alert Attention: Focused;Sustained Focused Attention: Appears intact Sustained  Attention: Impaired Sustained Attention Impairment: Verbal basic Memory: Impaired Memory Impairment: Storage deficit;Retrieval deficit;Decreased recall of new information;Decreased short term memory Decreased Short Term Memory: Verbal basic       Comprehension  Auditory Comprehension Overall Auditory Comprehension: Impaired Commands: Impaired One Step Basic Commands: 50-74% accurate Conversation: Simple Interfering Components: Attention;Processing speed;Working Radio broadcast assistant: Repetition    Expression Expression Primary Mode of Expression: Verbal Verbal Expression Overall Verbal Expression: (language of confusion) Automatic Speech: Name Level of Generative/Spontaneous Verbalization: Phrase;Word Repetition: Impaired Level of Impairment: Word level Naming: Impairment Confrontation: Impaired Interfering Components: Attention   Oral / Motor  Oral Motor/Sensory Function Overall Oral Motor/Sensory Function: Within functional limits Motor Speech Overall Motor Speech: Appears within functional limits for tasks assessed   GO                   Harlow Asa, MSP, CCC-SLP Speech Language Pathologist Office: [phone] Pager: [phone]  Leigh Aurora 01/09/2020, 10:16 AM

## 2020-01-09 NOTE — Evaluation (Signed)
Clinical/Bedside Swallow Evaluation Patient Details  Name: Kyle [name] MRN: [id number] Date of Birth: 08-21-1936  Today's Date: 01/09/2020 Time: SLP Start Time (ACUTE ONLY): 0900 SLP Stop Time (ACUTE ONLY): 0930 SLP Time Calculation (min) (ACUTE ONLY): 30 min  Past Medical History:  Past Medical History:  Diagnosis Date  . Alzheimer disease ([place]) 09/25/2019   Past Surgical History:  Past Surgical History:  Procedure Laterality Date  . APPENDECTOMY    . BACK SURGERY    . CHOLECYSTECTOMY    . HERNIA REPAIR     HPI:  84yo male admitted 01/07/20 with left side weakness x1 week. PMH: advanced dementia.  MRI = Confluent Acute Right ACA territory infarcts with mild petechial hemorrhage but no malignant hemorrhagic transformation or mass effect. Chronic left ACA territory infarct.   Assessment / Plan / Recommendation Clinical Impression  Pt presents with adequate dentition. No obvious oral motor weakness, however, pt unable to follow commands for CN exam. Suction set up at bedside to facilitate effective oral care. Following oral care, pt accepted trials of ice chips, thin liquid, puree, and solid textures. No obvious oral residue or anterior leakage observed. No overt s/s aspiration on any consistency. Will begin dys 2 diet with thin liquids via cup or straw, crushed meds. Given severity of cognitive impairment, finely chopped solids will be a safer consistency for this gentleman. Safe swallow precautions posted at [facility]. RN and MD informed. Wife present, and was educated regarding recommendations. SLP will follow for diet tolerance and education.Marland Kitchen   SLP Visit Diagnosis: Dysphagia, unspecified (R13.10)    Aspiration Risk  Mild aspiration risk    Diet Recommendation Dysphagia 2 (Fine chop);Thin liquid   Liquid Administration via: Cup;Straw Medication Administration: Crushed with puree Supervision: Staff to assist with self feeding;Full supervision/cueing for compensatory  strategies Compensations: Minimize environmental distractions;Slow rate;Small sips/bites Postural Changes: Seated upright at 90 degrees    Other  Recommendations Oral Care Recommendations: Oral care BID Other Recommendations: Have oral suction available   Follow up Recommendations 24 hour supervision/assistance;Skilled Nursing facility      Frequency and Duration min 1 x/week  1 week;2 weeks       Prognosis Prognosis for Safe Diet Advancement: Fair Barriers to Reach Goals: Cognitive deficits      Swallow Study   General Date of Onset: 01/07/20 HPI: 84yo male admitted 01/07/20 with left side weakness x1 week. PMH: advanced dementia.  MRI = Confluent Acute Right ACA territory infarcts with mild petechial hemorrhage but no malignant hemorrhagic transformation or mass effect. Chronic left ACA territory infarct. Type of Study: Bedside Swallow Evaluation Previous Swallow Assessment: none found Diet Prior to this Study: NPO Temperature Spikes Noted: No Respiratory Status: Room air History of Recent Intubation: No Behavior/Cognition: Alert;Cooperative;Confused;Doesn't follow directions;Requires cueing;Distractible Oral Cavity Assessment: Dry Oral Care Completed by SLP: Yes Oral Cavity - Dentition: Adequate natural dentition Vision: Functional for self-feeding Self-Feeding Abilities: Total assist Patient Positioning: Upright in bed Baseline Vocal Quality: Normal Volitional Cough: Cognitively unable to elicit Volitional Swallow: Unable to elicit    Oral/Motor/Sensory Function     Ice Chips Ice chips: Within functional limits Presentation: Spoon   Thin Liquid Thin Liquid: Within functional limits Presentation: Straw    Nectar Thick Nectar Thick Liquid: Not tested   Honey Thick Honey Thick Liquid: Not tested   Puree Puree: Within functional limits Presentation: Spoon   Solid     Solid: Within functional limits     Enriqueta Shutter, [place], [facility] Pathologist Office:  [phone] Pager: [phone]  Leigh Aurora 01/09/2020,10:09 AM

## 2020-01-10 ENCOUNTER — Other Ambulatory Visit (HOSPITAL_COMMUNITY): Payer: Medicare Other

## 2020-01-10 ENCOUNTER — Inpatient Hospital Stay (HOSPITAL_COMMUNITY): Payer: Medicare Other

## 2020-01-10 DIAGNOSIS — R509 Fever, unspecified: Secondary | ICD-10-CM

## 2020-01-10 DIAGNOSIS — I82441 Acute embolism and thrombosis of right tibial vein: Secondary | ICD-10-CM

## 2020-01-10 DIAGNOSIS — R2242 Localized swelling, mass and lump, left lower limb: Secondary | ICD-10-CM

## 2020-01-10 DIAGNOSIS — I639 Cerebral infarction, unspecified: Secondary | ICD-10-CM

## 2020-01-10 LAB — CBC
HCT: 41.9 % (ref 39.0–52.0)
Hemoglobin: 14.2 g/dL (ref 13.0–17.0)
MCH: 30.6 pg (ref 26.0–34.0)
MCHC: 33.9 g/dL (ref 30.0–36.0)
MCV: 90.3 fL (ref 80.0–100.0)
Platelets: 142 10*3/uL — ABNORMAL LOW (ref 150–400)
RBC: 4.64 MIL/uL (ref 4.22–5.81)
RDW: 13.2 % (ref 11.5–15.5)
WBC: 8 10*3/uL (ref 4.0–10.5)
nRBC: 0 % (ref 0.0–0.2)

## 2020-01-10 LAB — BASIC METABOLIC PANEL
Anion gap: 6 (ref 5–15)
BUN: 23 mg/dL (ref 8–23)
CO2: 27 mmol/L (ref 22–32)
Calcium: 8.3 mg/dL — ABNORMAL LOW (ref 8.9–10.3)
Chloride: 104 mmol/L (ref 98–111)
Creatinine, Ser: 1.39 mg/dL — ABNORMAL HIGH (ref 0.61–1.24)
GFR calc Af Amer: 54 mL/min — ABNORMAL LOW (ref >60)
GFR calc non Af Amer: 47 mL/min — ABNORMAL LOW (ref >60)
Glucose, Bld: 111 mg/dL — ABNORMAL HIGH (ref 70–99)
Potassium: 4.2 mmol/L (ref 3.5–5.1)
Sodium: 137 mmol/L (ref 135–145)

## 2020-01-10 MED ORDER — SODIUM CHLORIDE 0.9% FLUSH
3.0000 mL | Freq: Two times a day (BID) | INTRAVENOUS | Status: DC
  Administered 2020-01-10 – 2020-01-15 (×12): 3 mL via INTRAVENOUS

## 2020-01-10 MED ORDER — SODIUM CHLORIDE 0.9 % IV SOLN: 250.0000 mL | INTRAVENOUS | Status: DC | PRN

## 2020-01-10 MED ORDER — SODIUM CHLORIDE 0.9% FLUSH: 3.0000 mL | INTRAVENOUS | Status: DC | PRN

## 2020-01-10 NOTE — TOC Progression Note (Signed)
Transition of Care [facility]) - Progression Note    Patient Details  Name: Kyle [name] MRN: [id number] Date of Birth: 1936/04/06  Transition of Care [facility]) CM/SW Contact  Terrilee Croak, Student-Social Work Phone Number: 01/10/2020, 1:09 PM  Clinical Narrative:     MSW Intern contacted Pt's son to discuss placement options for PT's impending discharge. He was given a choice between [address], [address], and [place]. He said that he was familiar with [facility], but would like to discuss options with the family.  He stated that he had questions about the Pt's medical status and how the Pt's stroke came about. MSW Intern was unable to answer questions, but told him that the doctor can be asked to give him a call to answer some questions. MSW Intern will continue to follow.  Expected Discharge Plan: Long Term Nursing Home Barriers to Discharge: Continued Medical Work up, Conservator, museum/gallery and Services Expected Discharge Plan: Long Term Nursing Home     Post Acute Care Choice: Nursing Home Living arrangements for the past 2 months: Single Family Home                                       Social Determinants of Health (SDOH) Interventions    Readmission Risk Interventions No flowsheet data found.

## 2020-01-10 NOTE — TOC Progression Note (Signed)
Transition of Care [facility]) - Progression Note    Patient Details  Name: Kyle [name] MRN: [id number] Date of Birth: [birth year]  Transition of Care [facility]) CM/SW Contact  Terrilee Croak, Student-Social Work Phone Number: 01/10/2020, 3:01 PM  Clinical Narrative:     PT son called to follow up with SNF choice. Family has chosen [facility]. [facility] said they would have a bed available for tomorrow. Insurance authorization started in preparation for possible DC tomorrow. SW will continue to follow.   Expected Discharge Plan: Long Term Nursing Home Barriers to Discharge: Continued Medical Work up, Conservator, museum/gallery and Services Expected Discharge Plan: Long Term Nursing Home     Post Acute Care Choice: Nursing Home Living arrangements for the past 2 months: Single Family Home                                       Social Determinants of Health (SDOH) Interventions    Readmission Risk Interventions No flowsheet data found.

## 2020-01-10 NOTE — Plan of Care (Signed)
  Problem: Education: Goal: Knowledge of General Education information will improve Description Including pain rating scale, medication(s)/side effects and non-pharmacologic comfort measures Outcome: Progressing   Problem: Health Behavior/Discharge Planning: Goal: Ability to manage health-related needs will improve Outcome: Progressing   Problem: Clinical Measurements: Goal: Ability to maintain clinical measurements within normal limits will improve Outcome: Progressing Goal: Will remain free from infection Outcome: Progressing Goal: Diagnostic test results will improve Outcome: Progressing Goal: Respiratory complications will improve Outcome: Progressing Goal: Cardiovascular complication will be avoided Outcome: Progressing   Problem: Activity: Goal: Risk for activity intolerance will decrease Outcome: Progressing   Problem: Nutrition: Goal: Adequate nutrition will be maintained Outcome: Progressing   Problem: Coping: Goal: Level of anxiety will decrease Outcome: Progressing   Problem: Elimination: Goal: Will not experience complications related to bowel motility Outcome: Progressing Goal: Will not experience complications related to urinary retention Outcome: Progressing   Problem: Pain Managment: Goal: General experience of comfort will improve Outcome: Progressing   Problem: Safety: Goal: Ability to remain free from injury will improve Outcome: Progressing   Problem: Skin Integrity: Goal: Risk for impaired skin integrity will decrease Outcome: Progressing   Problem: Education: Goal: Knowledge of disease or condition will improve Outcome: Progressing Goal: Knowledge of secondary prevention will improve Outcome: Progressing Goal: Knowledge of patient specific risk factors addressed and post discharge goals established will improve Outcome: Progressing Goal: Individualized Educational Video(s) Outcome: Progressing   Problem: Coping: Goal: Will verbalize  positive feelings about self Outcome: Progressing Goal: Will identify appropriate support needs Outcome: Progressing   Problem: Health Behavior/Discharge Planning: Goal: Ability to manage health-related needs will improve Outcome: Progressing   Problem: Self-Care: Goal: Ability to participate in self-care as condition permits will improve Outcome: Progressing Goal: Verbalization of feelings and concerns over difficulty with self-care will improve Outcome: Progressing Goal: Ability to communicate needs accurately will improve Outcome: Progressing   Problem: Nutrition: Goal: Risk of aspiration will decrease Outcome: Progressing Goal: Dietary intake will improve Outcome: Progressing   Problem: Ischemic Stroke/TIA Tissue Perfusion: Goal: Complications of ischemic stroke/TIA will be minimized Outcome: Progressing  Adalaide Jaskolski, BSN, RN 

## 2020-01-10 NOTE — Progress Notes (Signed)
STROKE TEAM PROGRESS NOTE   INTERVAL HISTORY Wife at bedside. Still has left LE weakness. Had TCD bubble study showed negative for PFO although valsalva not cooperative. Pending SNF.   Vitals:   01/10/20 0023 01/10/20 0500 01/10/20 0822 01/10/20 1159  BP:  (!) 151/74 (!) 155/70 140/71  Pulse:  60 62 65  Resp: 20 18 16 16   Temp:  98.3 F (36.8 C) (!) 97.4 F (36.3 C) (!) 97.5 F (36.4 C)  TempSrc:  Oral Oral Oral  SpO2:  95% 96% 95%  Weight:      Height:        CBC:  Recent Labs  Lab 01/07/20 1814 01/07/20 1814 01/07/20 1835 01/10/20 0337  WBC 10.4  --   --  8.0  NEUTROABS 7.8*  --   --   --   HGB 14.8   < > 14.6 14.2  HCT 45.4   < > 43.0 41.9  MCV 93.0  --   --  90.3  PLT 151  --   --  142*   < > = values in this interval not displayed.    Basic Metabolic Panel:  Recent Labs  Lab 01/07/20 1814 01/07/20 1814 01/07/20 1835 01/10/20 0337  NA 137   < > 139 137  K 3.9   < > 3.9 4.2  CL 103  --   --  104  CO2 26  --   --  27  GLUCOSE 123*  --   --  111*  BUN 17  --   --  23  CREATININE 1.40*  --   --  1.39*  CALCIUM 8.3*  --   --  8.3*   < > = values in this interval not displayed.   Lipid Panel:     Component Value Date/Time   CHOL 169 01/09/2020 0230   TRIG 80 01/09/2020 0230   HDL 34 (L) 01/09/2020 0230   CHOLHDL 5.0 01/09/2020 0230   VLDL 16 01/09/2020 0230   LDLCALC 119 (H) 01/09/2020 0230   HgbA1c:  Lab Results  Component Value Date   HGBA1C 5.5 01/09/2020   Urine Drug Screen: No results found for: LABOPIA, COCAINSCRNUR, LABBENZ, AMPHETMU, THCU, LABBARB  Alcohol Level No results found for: ETH  IMAGING past 48 hours DG Foot 2 Views Left  Result Date: 01/08/2020 CLINICAL DATA:  Localized swelling and redness of the left foot new onset left-sided weakness and paralysis. EXAM: LEFT FOOT - 2 VIEW COMPARISON:  None. FINDINGS: No acute fracture or traumatic malalignment is evident. There is focal severe arthrosis at the first metatarsophalangeal  joint with extensive sclerotic features and bony remodeling. Periarticular osteophyte formations are noted as well as mild soft tissue swelling. Additional degenerative changes throughout the left foot are more mild. Questionable lucency along the anterolateral corner of the cuboid could reflect a small marginal erosion. 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 Recommend correlation with mobility to assess for hallux limitus. Questionable erosion along the anterolateral corner of the cuboid, could reflect some underlying arthropathy. Correlation with exam findings and serologies could be considered. Electronically Signed   By: 03/07/2020 M.D.   On: 01/08/2020 19:01   VAS 03/07/2020 CAROTID  Result Date: 01/10/2020 Carotid Arterial Duplex Study Indications:       CVA. Comparison Study:  No prior study Performing Technologist: Gertie FeyMichelle Simonetti MHA, RDMS, RVT, RDCS  Examination Guidelines: A complete evaluation includes B-mode imaging, spectral Doppler, color Doppler, and power Doppler as needed of all accessible portions of each vessel. Bilateral testing is considered an integral part of a complete examination. Limited examinations for reoccurring indications may be performed as noted.  Right Carotid Findings: +----------+--------+-------+--------+----------------------+------------------+           PSV cm/sEDV    StenosisPlaque Description    Comments                             cm/s                                                    +----------+--------+-------+--------+----------------------+------------------+ CCA Prox  101     11                                   intimal thickening +----------+--------+-------+--------+----------------------+------------------+ CCA Distal75       9              smooth and                                                                heterogenous                             +----------+--------+-------+--------+----------------------+------------------+ ICA Prox  44      11             smooth and                                                                heterogenous                             +----------+--------+-------+--------+----------------------+------------------+ Unable to visualize distal ICA, ECA, vertebral artery, subclavian artery. Left Carotid Findings: +----------+--------+--------+--------+-----------------------+--------+           PSV cm/sEDV cm/sStenosisPlaque Description     Comments +----------+--------+--------+--------+-----------------------+--------+ CCA Prox  170     16                                              +----------+--------+--------+--------+-----------------------+--------+ CCA Distal78      13              smooth and heterogenous         +----------+--------+--------+--------+-----------------------+--------+ ICA Prox  42      7                                               +----------+--------+--------+--------+-----------------------+--------+  ICA Distal59      12                                              +----------+--------+--------+--------+-----------------------+--------+ ECA       124     6                                               +----------+--------+--------+--------+-----------------------+--------+ +----------+--------+--------+----------------+-------------------+           PSV cm/sEDV cm/sDescribe        Arm Pressure (mmHG) +----------+--------+--------+----------------+-------------------+ [id number]              Multiphasic, WNL                    +----------+--------+--------+----------------+-------------------+ +---------+--------+--+--------+-+---------+ VertebralPSV cm/s18EDV cm/s4Antegrade  +---------+--------+--+--------+-+---------+  Summary: Right Carotid: Velocities in the right ICA are consistent with a 1-39% stenosis. Left Carotid: Velocities in the left ICA are consistent with a 1-39% stenosis. Vertebrals:  Left vertebral artery demonstrates antegrade flow. Unable to              visualize right vertebral artery. Subclavians: Normal flow hemodynamics were seen in the left subclavian artery.              Unable to visualize the right subclavian artery. *See table(s) above for measurements and observations.  Electronically signed by Delia Heady MD on 01/10/2020 at 8:27:56 AM.    Final    VAS [place] LOWER EXTREMITY VENOUS (DVT)  Result Date: 01/09/2020  Lower Venous Study Indications: Stroke.  Comparison Study: No prior study. Performing Technologist: Gertie Fey MHA, RDMS, RVT, RDCS  Examination Guidelines: A complete evaluation includes B-mode imaging, spectral Doppler, color Doppler, and power Doppler as needed of all accessible portions of each vessel. Bilateral testing is considered an integral part of a complete examination. Limited examinations for reoccurring indications may be performed as noted.  +---------+---------------+---------+-----------+----------+--------------+ RIGHT    CompressibilityPhasicitySpontaneityPropertiesThrombus Aging +---------+---------------+---------+-----------+----------+--------------+ CFV      Full           Yes      Yes                                 +---------+---------------+---------+-----------+----------+--------------+ SFJ      Full                                                        +---------+---------------+---------+-----------+----------+--------------+ FV Prox  Full                                                        +---------+---------------+---------+-----------+----------+--------------+ FV Mid   Full                                                         +---------+---------------+---------+-----------+----------+--------------+  FV DistalFull                                                        +---------+---------------+---------+-----------+----------+--------------+ PFV      Full                                                        +---------+---------------+---------+-----------+----------+--------------+ POP      Full           Yes      Yes                                 +---------+---------------+---------+-----------+----------+--------------+ PTV      None                    No                                  +---------+---------------+---------+-----------+----------+--------------+   Right Technical Findings: Not visualized segments include peroneal veins.  +---------+---------------+---------+-----------+----------+--------------+ LEFT     CompressibilityPhasicitySpontaneityPropertiesThrombus Aging +---------+---------------+---------+-----------+----------+--------------+ CFV      Full           Yes      Yes                                 +---------+---------------+---------+-----------+----------+--------------+ SFJ      Full                                                        +---------+---------------+---------+-----------+----------+--------------+ FV Prox  Full                                                        +---------+---------------+---------+-----------+----------+--------------+ FV Mid   Full                                                        +---------+---------------+---------+-----------+----------+--------------+ FV DistalFull                                                        +---------+---------------+---------+-----------+----------+--------------+ PFV      Full                                                        +---------+---------------+---------+-----------+----------+--------------+  POP      Full           Yes      Yes                                  +---------+---------------+---------+-----------+----------+--------------+ PTV      Full                                                        +---------+---------------+---------+-----------+----------+--------------+ PERO     Full                                                        +---------+---------------+---------+-----------+----------+--------------+     Summary: Right: Findings consistent with acute deep vein thrombosis involving the right posterior tibial veins. No cystic structure found in the popliteal fossa. Left: There is no evidence of deep vein thrombosis in the lower extremity. No cystic structure found in the popliteal fossa.  *See table(s) above for measurements and observations. Electronically signed by Coral ElseVance Brabham MD on 01/09/2020 at 8:31:35 PM.    Final     PHYSICAL EXAM    Temp:  [97.4 F (36.3 C)-99.1 F (37.3 C)] 97.5 F (36.4 C) (01/14 1159) Pulse Rate:  [60-66] 65 (01/14 1159) Resp:  [16-22] 16 (01/14 1159) BP: (132-155)/(69-74) 140/71 (01/14 1159) SpO2:  [94 %-97 %] 95 % (01/14 1159)  General - Well nourished, well developed, in no apparent distress.  Ophthalmologic - fundi not visualized due to noncooperation.  Cardiovascular - Regular rhythm and rate except frequent PVCs on tele.  Extremities - left foot swollen and red, nontender, more centered at left big toe  Neuro -  Awake, eyes open, able to tell me his name and wife's name but not orienated to time, place or age. Very limited speech output, not able to answer other questions. Severe dysarthria, not following peripheral simple commands. Blinking to visual threat bilaterally. Attending to both sides but slow eye movement. PERRL. Facial symmetrical, but masked face. Tongue protrusion not cooperative. RUE 3/5 and LUE 3-/5 with drift. LLE flaccid and RLE 2/5 movement with significantly increased muscle tone. DTR 1+ and left babinski positive. Sensation, coordination  and gait not tested.   ASSESSMENT/PLAN Mr. Alberteen SamRobert L Guilmette is a 84 y.o. male with history of advanced dementia presenting following multiple severe falls with L sided weakness with decreased speech output.   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 Moderate small vessel disease. Atrophy.   MRI  R ACA infarct w/ mild petechial hemorrhage. Old L ACA infarct. Small vessel disease. Atrophy.   MRA  Occlusion R A2. Moderate to severe small vessel disease.   Carotid Doppler unremarkable  2D Echo EF 55-60%  LE venous doppler right posterior tibial vein acute DVT   TCD bubble study negative for PFO   Recommend 30 day cardiac event monitoring as outpt to rule out afib  LDL 119   HgbA1c 5.5   lovenox for VTE prophylaxis  No antithrombotic prior to admission, now on aspirin 81 and  plavix 75 mg daily x 3 weeks then aspirin alone.  Therapy recommendations:  SNF  Disposition:  pending   Right LE distal DVT  LE venous doppler right posterior tibial vein acute DVT   TCD bubble study no PFO   Recommend close monitoring RLE and repeat LE venous doppler in 1-2 weeks to rule out proximal progression  Continue lovenox for DVT  D/c SCDs  Cause likely due to pt inactivity at home  Left foot red and swollen  X-ray severe arthrosis first MTJ w/ soft tissue swelling. ? Erosion anterolateral corner of cuboid, ? Underlying arthropathy   LE venous doppler no DVT on the left   Blood culture NGTD  Treatment as per primary team  Low grade fever  Tmax 100.1 -> afebrile  UA neg  WBC normal   Blood culture NGTD  On IVF  Hyperlipidemia  LDL 119, goal LDL < 70  on no statin PTA  Add lipitor 40mg   Continue statin on discharge    Dysphagia . Secondary to stroke . NPO . Speech on board . Cleared for D2 thin liqs . Resumed home meds       Other Stroke Risk Factors  Advanced age  Other Active Problems  Severe  Alzheimer's dementia on namenda and exelon patch  Elevated TBili, rest of LFT WNL, monitor as OP  Hospital day # 2  Neurology will sign off. Please call with questions. Pt will follow up with Ms. Slack NP at [facility] in about 4 weeks. Thanks for the consult.  Rosalin Hawking, MD PhD Stroke Neurology 01/10/2020 3:00 PM  To contact Stroke Continuity provider, please refer to [email or web address] After hours, contact General Neurology

## 2020-01-10 NOTE — Progress Notes (Addendum)
PROGRESS NOTE  Kyle [name] PJA:[id number] DOB: [birth year] DOA: 01/07/2020 PCP: Burton Apley, MD   LOS: 2 days   Brief narrative:  As per HPI,  Kyle [name] is a 84 y.o. male with medical history significant of advanced Alzheimer's dementia presenting to the ED for evaluation of left-sided weakness which started a week ago.  No history could be obtained from the patient given his baseline dementia.  History was obtained from wife at bedside.  Wife stated that for the past 1 week patient has had weakness in his left arm and left leg. When he was trying to walk using a walker but slid down on the floor.  He did not hit his head or sustain any injuries from the fall.   ED Course: Temperature 100.1 F.  Not tachycardic.  Slightly tachypneic.  Not hypotensive.  Labs showing no leukocytosis.  Blood glucose 123.  Creatinine 1.4, at baseline.  T bili 1.9, remainder of LFTs normal.  T bili was normal on labs done a year ago.  SARS-CoV-2 PCR test negative.  UA not suggestive of infection.  Chest x-ray showed cardiomegaly and no active disease.  Abdominal x-ray showed no acute findings.  Head CT was negative for acute intracranial hemorrhage but with multifocal hypodensities with loss of gray-white matter discrimination.  Brain MRI showed acute right ACA territory infarcts with mild petechial hemorrhage but no malignant hemorrhagic transformation or mass-effect.  Chronic left ACA territory infarct. Patient received a 500 cc normal saline bolus.  Patient was seen by neurology-outside window for thrombolysis or endovascular thrombectomy due to last known normal over a week ago.  Assessment/Plan:  Principal Problem:   Stroke [facility]) Active Problems:   Alzheimer disease (HCC)   Serum total bilirubin elevated  Acute ischemic stroke Presented with 1 week history of left-sided weakness.  Brain MRI showed acute right ACA territory infarct with mild petechial hemorrhage but no hemorrhagic transformation or  mass-effect.  Seen by neurology and patient was outside window for thrombolytic treatement.  Patient was then admitted for stroke work-up.  We will continue dual antiplatelets for 3 weeks then aspirin alone.   MR angiogram of the head showed occlusion of the right A2 segment corresponding to right anterior cerebral artery infarct.  2D echocardiogram performed on 01/08/2020 showed LV ejection fraction of 55 to 60%.  Carotid duplex ultrasound without significant stenosis and lower extremity vascular duplex  Ultrasound showed Right distal (posterior tibial veins) DVT.  Lipid panel reviewed with HDL of 34.  LDL 119.  Hemoglobin A1c of 5.5.  Patient  has been seen by speech therapy and recommend dysphagia 2 diet. PT, OT recommend SNF.  D Dimer elevated. Neurology recommending bubble study due to DVT. No plans for anticoagulation due to distal DVT but will need to determine if there is PFO.Marland Kitchen   Dysphagia secondary to stroke.  Neurology on board. On Dual antiplatelet therapy.  Patient has been seen by speech therapy who recommend dysphagia 2 diet. Being assisted by his wife today.  Mild grade fever No leukocytosis.  X-ray without any evidence of pneumonia.  Covid test was negative.  T-max of 99.1 at this time.  Urinalysis was negative.  Follow blood cultures. No indication for antibiotics at this time.  Elevated T bilirubin T bili 1.9, mildly elevated direct bilirubin with disproportionate indirect bilirubin elevation.  Rest of the LFTs are within normal limits.  We will continue to monitor LFTs.  Advanced Alzheimer's dementia Continue Namenda and Exelon patch.  Continue supportive  care.  Left foot redness and swelling.  Improved. X-ray of the left foot showed arthrosis of the first metatarsophalangeal joint with soft tissue swelling.  Negative blood cultures in 2 days.  Right posterior tibial DVT. Spoke with neurology yesterday. Since this is distal DVT will monitor closely monitor with serial ultrasound.   Patient is already on dual antiplatelets and has petechial hemorrhage in the brain.   transcranial Doppler with bubble study planned as per neurology.  Anticoagulation plan if there is a PFO.  If not serial ultrasound as outpatient.   VTE Prophylaxis: Lovenox  Code Status: DO NOT RESUSCITATE  Family Communication: I spoke with the patient's wife at bedside and updated her about the clinical condition of the patient. Unable to connect the patient's son phone number.  Disposition Plan:   Skilled nursing facility placement as per PT. social worker on board and the plan is long-term nursing home.  Follow neurology recommendations.  Transcranial Doppler with bubble study pending.   Consultants:  Neurology  Procedures:  None  Antibiotics:  Anti-infectives (From admission, onward)   None     Subjective: Today, spoke with the patient's wife at bedside.  Patient's wife states that he is looking better.  She thinks that he is probably at his baseline mentation.  Unable to have meaningful conversation with the patient.  Patient was being fed by his wife at the time of my interview.  Objective: Vitals:   01/10/20 0023 01/10/20 0500  BP:  (!) 151/74  Pulse:  60  Resp: 20 18  Temp:  98.3 F (36.8 C)  SpO2:  95%    Intake/Output Summary (Last 24 hours) at 01/10/2020 0758 Last data filed at 01/10/2020 0537 Gross per 24 hour  Intake 246 ml  Output 750 ml  Net -504 ml   Filed Weights   01/08/20 1530  Weight: 88.7 kg   Body mass index is 26.52 kg/m.   Physical Exam:  GENERAL: Patient is alert awake at the time of my evaluation.  Assisted by his family for feeding.   HENT: No scleral pallor or icterus. Pupils equally reactive to light. Oral mucosa is moist NECK: is supple, no palpable thyroid enlargement. CHEST: Clear to auscultation. No crackles or wheezes. Non tender on palpation. Diminished breath sounds bilaterally. CVS: S1 and S2 heard, no murmur. Regular rate and rhythm.  No pericardial rub. ABDOMEN: Soft, non-tender, bowel sounds are present. EXTREMITIES: Trace bilateral lower extremity edema..  On mittens.  Left great toe redness has improved. Left sided weakness more than the right.  Increased muscle tone. CNS: Alert awake, unable to assess orientation.  Has baseline dementia.  Has severe dysarthria. SKIN: warm and dry without rashes.  Data Review: I have personally reviewed the following laboratory data and studies,  CBC: Recent Labs  Lab 01/07/20 1814 01/07/20 1835 01/10/20 0337  WBC 10.4  --  8.0  NEUTROABS 7.8*  --   --   HGB 14.8 14.6 14.2  HCT 45.4 43.0 41.9  MCV 93.0  --  90.3  PLT 151  --  142*   Basic Metabolic Panel: Recent Labs  Lab 01/07/20 1814 01/07/20 1835 01/10/20 0337  NA 137 139 137  K 3.9 3.9 4.2  CL 103  --  104  CO2 26  --  27  GLUCOSE 123*  --  111*  BUN 17  --  23  CREATININE 1.40*  --  1.39*  CALCIUM 8.3*  --  8.3*   Liver Function Tests: Recent  Labs  Lab 01/07/20 1814 01/08/20 0540  AST 20  --   ALT 24  --   ALKPHOS 74  --   BILITOT 1.9* 1.8*  PROT 6.3*  --   ALBUMIN 3.2*  --    No results for input(s): LIPASE, AMYLASE in the last 168 hours. No results for input(s): AMMONIA in the last 168 hours. Cardiac Enzymes: No results for input(s): CKTOTAL, CKMB, CKMBINDEX, TROPONINI in the last 168 hours. BNP (last 3 results) No results for input(s): BNP in the last 8760 hours.  ProBNP (last 3 results) No results for input(s): PROBNP in the last 8760 hours.  CBG: Recent Labs  Lab 01/07/20 1641 01/08/20 1232 01/08/20 2130  GLUCAP 92 93 104*   Recent Results (from the past 240 hour(s))  SARS CORONAVIRUS 2 (TAT 6-24 HRS) Nasopharyngeal Nasopharyngeal Swab     Status: None   Collection Time: 01/07/20  5:10 PM   Specimen: Nasopharyngeal Swab  Result Value Ref Range Status   SARS Coronavirus 2 NEGATIVE NEGATIVE Final    Comment: (NOTE) SARS-CoV-2 target nucleic acids are NOT DETECTED. The SARS-CoV-2  RNA is generally detectable in upper and lower respiratory specimens during the acute phase of infection. Negative results do not preclude SARS-CoV-2 infection, do not rule out co-infections with other pathogens, and should not be used as the sole basis for treatment or other patient management decisions. Negative results must be combined with clinical observations, patient history, and epidemiological information. The expected result is Negative. Fact Sheet for Patients: HairSlick.no Fact Sheet for Healthcare Providers: quierodirigir.com This test is not yet approved or cleared by the [place] FDA and  has been authorized for detection and/or diagnosis of SARS-CoV-2 by FDA under an Emergency Use Authorization (EUA). This EUA will remain  in effect (meaning this test can be used) for the duration of the COVID-19 declaration under Section 56 4(b)(1) of the Act, 21 U.S.C. section 360bbb-3(b)(1), unless the authorization is terminated or revoked sooner. Performed at [facility], [address] [place], [place] [address]   Culture, blood (Routine X 2) w Reflex to ID Panel     Status: None (Preliminary result)   Collection Time: 01/08/20  6:05 PM   Specimen: BLOOD  Result Value Ref Range Status   Specimen Description BLOOD LEFT ANTECUBITAL  Final   Special Requests   Final    BOTTLES DRAWN AEROBIC AND ANAEROBIC Blood Culture adequate volume   Culture   Final    NO GROWTH < 24 HOURS Performed at [facility], [address], [place], [place] [address]    Report Status PENDING  Incomplete  Culture, blood (Routine X 2) w Reflex to ID Panel     Status: None (Preliminary result)   Collection Time: 01/08/20  6:15 PM   Specimen: BLOOD RIGHT ARM  Result Value Ref Range Status   Specimen Description BLOOD RIGHT ARM  Final   Special Requests   Final    BOTTLES DRAWN AEROBIC AND ANAEROBIC Blood Culture results may not be  optimal due to an inadequate volume of blood received in culture bottles   Culture   Final    NO GROWTH < 24 HOURS Performed at [facility], [address] [place], [place] [address]    Report Status PENDING  Incomplete     Studies: MR ANGIO HEAD WO CONTRAST  Result Date: 01/08/2020 CLINICAL DATA:  Stroke.  Left-sided weakness EXAM: MRA HEAD WITHOUT CONTRAST TECHNIQUE: Angiographic images of  the Circle of Willis were obtained using MRA technique without intravenous contrast. COMPARISON:  MRI head 01/07/2000 FINDINGS: Severe stenosis distal left vertebral artery V4 segment. Right vertebral artery patent. PICA patent bilaterally with the origins not imaged. Mild atherosclerotic disease in the basilar without stenosis. Moderate stenosis right P2. Mild stenosis left P2. Cavernous carotid widely patent bilaterally.  Negative for aneurysm. Occlusion of the right A2 segment corresponding to right anterior cerebral artery infarct. Right middle cerebral artery patent. Multiple areas of irregularity and stenosis right M2 and M3 branches. Left anterior cerebral artery patent with mild atherosclerotic disease. Left middle cerebral artery patent with multiple areas of atherosclerotic irregularity and stenosis left M2 and M3 branches. IMPRESSION: 1. Occlusion of the right A2 segment corresponding to right anterior cerebral artery infarct. 2. Moderate to advanced intracranial atherosclerotic disease as above. Electronically Signed   By: Marlan Palauharles  Clark M.D.   On: 01/08/2020 08:33   DG Foot 2 Views Left  Result Date: 01/08/2020 CLINICAL DATA:  Localized swelling and redness of the left foot new onset left-sided weakness and paralysis. EXAM: LEFT FOOT - 2 VIEW COMPARISON:  None. FINDINGS: No acute fracture or traumatic malalignment is evident. There is focal severe arthrosis at the first metatarsophalangeal joint with extensive sclerotic features and bony remodeling. Periarticular osteophyte formations are  noted as well as mild soft tissue swelling. Additional degenerative changes throughout the left foot are more mild. Questionable lucency along the anterolateral corner of the cuboid could reflect a small marginal erosion. 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 Recommend correlation with mobility to assess for hallux limitus. Questionable erosion along the anterolateral corner of the cuboid, could reflect some underlying arthropathy. Correlation with exam findings and serologies could be considered. Electronically Signed   By: Kreg ShropshirePrice  DeHay M.D.   On: 01/08/2020 19:01   ECHOCARDIOGRAM COMPLETE  Result Date: 01/08/2020   ECHOCARDIOGRAM REPORT   Patient Name:   Kyle [name] Date of Exam: 01/08/2020 Medical Rec #:  [id number]       Height:       72.0 in Accession #:    [id number]      Weight:       197.3 lb Date of Birth:  10/28/1936        BSA:          2.12 m Patient Age:    84 years        BP:           148/71 mmHg Patient Gender: M               HR:           70 bpm. Exam Location:  Inpatient Procedure: 2D Echo, Cardiac Doppler and Color Doppler Indications:    Stroke 434.91  History:        Patient has no prior history of Echocardiogram examinations.                 Stroke; Risk Factors:Non-Smoker.  Sonographer:    Tonia GhentJulia Underwood RDCS Referring Phys: [id number] VASUNDHRA RATHORE IMPRESSIONS  1. Left ventricular ejection fraction, by visual estimation, is 55 to 60%. The left ventricle has normal function. There is no left ventricular hypertrophy.  2. The left ventricle has no regional wall motion abnormalities.  3. Global right ventricle has normal  systolic function.The right ventricular size is normal. No increase in right ventricular wall thickness.  4. Left atrial size was normal.  5. Right  atrial size was normal.  6. The mitral valve is normal in structure. No evidence of mitral valve regurgitation. No evidence of mitral stenosis.  7. The tricuspid valve is normal in structure.  8. The aortic valve is normal in structure. Aortic valve regurgitation is mild. No evidence of aortic valve sclerosis or stenosis.  9. The pulmonic valve was normal in structure. Pulmonic valve regurgitation is not visualized. 10. Mildly elevated pulmonary artery systolic pressure. 11. The tricuspid regurgitant velocity is 2.80 m/s, and with an assumed right atrial pressure of 3 mmHg, the estimated right ventricular systolic pressure is mildly elevated at 34.4 mmHg. 12. The inferior vena cava is normal in size with greater than 50% respiratory variability, suggesting right atrial pressure of 3 mmHg. FINDINGS  Left Ventricle: Left ventricular ejection fraction, by visual estimation, is 55 to 60%. The left ventricle has normal function. The left ventricle has no regional wall motion abnormalities. The left ventricular internal cavity size was the left ventricle is normal in size. There is no left ventricular hypertrophy. Left ventricular diastolic parameters were normal. Normal left atrial pressure. Right Ventricle: The right ventricular size is normal. No increase in right ventricular wall thickness. Global RV systolic function is has normal systolic function. The tricuspid regurgitant velocity is 2.80 m/s, and with an assumed right atrial pressure  of 3 mmHg, the estimated right ventricular systolic pressure is mildly elevated at 34.4 mmHg. Left Atrium: Left atrial size was normal in size. Right Atrium: Right atrial size was normal in size Pericardium: There is no evidence of pericardial effusion. Mitral Valve: The mitral valve is normal in structure. No evidence of mitral valve regurgitation. No evidence of mitral valve stenosis by observation. Tricuspid Valve: The tricuspid valve is normal in structure. Tricuspid valve  regurgitation is trivial. Aortic Valve: The aortic valve is normal in structure. Aortic valve regurgitation is mild. The aortic valve is structurally normal, with no evidence of sclerosis or stenosis. Pulmonic Valve: The pulmonic valve was normal in structure. Pulmonic valve regurgitation is not visualized. Pulmonic regurgitation is not visualized. Aorta: The aortic root, ascending aorta and aortic arch are all structurally normal, with no evidence of dilitation or obstruction. Venous: The inferior vena cava is normal in size with greater than 50% respiratory variability, suggesting right atrial pressure of 3 mmHg. IAS/Shunts: No atrial level shunt detected by color flow Doppler. There is no evidence of a patent foramen ovale. No ventricular septal defect is seen or detected. There is no evidence of an atrial septal defect.  LEFT VENTRICLE PLAX 2D LVIDd:         4.70 cm       Diastology LVIDs:         3.39 cm       LV e' lateral:   9.79 cm/s LV PW:         0.83 cm       LV E/e' lateral: 7.3 LV IVS:        0.81 cm       LV e' medial:    6.20 cm/s LVOT diam:     1.80 cm       LV E/e' medial:  11.5 LV SV:         55 ml LV SV Index:   25.76 LVOT Area:     2.54 cm  LV Volumes (MOD)  LV area d, A2C:    34.30 cm LV area d, A4C:    33.90 cm LV area s, A2C:    19.00 cm LV area s, A4C:    22.80 cm LV major d, A2C:   7.88 cm LV major d, A4C:   7.50 cm LV major s, A2C:   6.22 cm LV major s, A4C:   6.54 cm LV vol d, MOD A2C: 126.0 ml LV vol d, MOD A4C: 128.0 ml LV vol s, MOD A2C: 51.5 ml LV vol s, MOD A4C: 69.3 ml LV SV MOD A2C:     74.5 ml LV SV MOD A4C:     128.0 ml LV SV MOD BP:      68.7 ml RIGHT VENTRICLE RV S prime:     16.50 cm/s TAPSE (M-mode): 1.9 cm LEFT ATRIUM             Index       RIGHT ATRIUM           Index LA diam:        3.00 cm 1.42 cm/m  RA Area:     15.30 cm LA Vol (A2C):   42.6 ml 20.11 ml/m RA Volume:   37.70 ml  17.80 ml/m LA Vol (A4C):   40.3 ml 19.02 ml/m LA Biplane Vol: 43.4 ml 20.49 ml/m   AORTIC VALVE LVOT Vmax:   120.00 cm/s LVOT Vmean:  75.300 cm/s LVOT VTI:    0.246 m  AORTA Ao Root diam: 3.20 cm MITRAL VALVE                        TRICUSPID VALVE MV Area (PHT): 2.99 cm             TR Peak grad:   31.4 mmHg MV PHT:        73.66 msec           TR Vmax:        280.00 cm/s MV Decel Time: 254 msec MV E velocity: 71.60 cm/s 103 cm/s  SHUNTS MV A velocity: 80.60 cm/s 70.3 cm/s Systemic VTI:  0.25 m MV E/A ratio:  0.89       1.5       Systemic Diam: 1.80 cm  Rachelle Hora Croitoru MD Electronically signed by Thurmon Fair MD Signature Date/Time: 01/08/2020/4:58:36 PM    Final    VAS US CAROTID  Result Date: 01/09/2020 Carotid Arterial Duplex Study Indications:       CVA. Comparison Study:  No prior study Performing Technologist: Gertie Fey MHA, RDMS, RVT, RDCS  Examination Guidelines: A complete evaluation includes B-mode imaging, spectral Doppler, color Doppler, and power Doppler as needed of all accessible portions of each vessel. Bilateral testing is considered an integral part of a complete examination. Limited examinations for reoccurring indications may be performed as noted.  Right Carotid Findings: +----------+--------+-------+--------+----------------------+------------------+           PSV cm/sEDV    StenosisPlaque Description    Comments                             cm/s                                                    +----------+--------+-------+--------+----------------------+------------------+ CCA  Prox  101     11                                   intimal thickening +----------+--------+-------+--------+----------------------+------------------+ CCA Distal75      9              smooth and                                                                heterogenous                             +----------+--------+-------+--------+----------------------+------------------+ ICA Prox  44      11             smooth and                                                                 heterogenous                             +----------+--------+-------+--------+----------------------+------------------+ Unable to visualize distal ICA, ECA, vertebral artery, subclavian artery. Left Carotid Findings: +----------+--------+--------+--------+-----------------------+--------+           PSV cm/sEDV cm/sStenosisPlaque Description     Comments +----------+--------+--------+--------+-----------------------+--------+ CCA Prox  170     16                                              +----------+--------+--------+--------+-----------------------+--------+ CCA Distal78      13              smooth and heterogenous         +----------+--------+--------+--------+-----------------------+--------+ ICA Prox  42      7                                               +----------+--------+--------+--------+-----------------------+--------+ ICA Distal59      12                                              +----------+--------+--------+--------+-----------------------+--------+ ECA       124     6                                               +----------+--------+--------+--------+-----------------------+--------+ +----------+--------+--------+----------------+-------------------+           PSV cm/sEDV cm/sDescribe        Arm Pressure (mmHG) +----------+--------+--------+----------------+-------------------+ [id number]  Multiphasic, WNL                    +----------+--------+--------+----------------+-------------------+ +---------+--------+--+--------+-+---------+ VertebralPSV cm/s18EDV cm/s4Antegrade +---------+--------+--+--------+-+---------+  Summary: Right Carotid: Velocities in the right ICA are consistent with a 1-39% stenosis. Left Carotid: Velocities in the left ICA are consistent with a 1-39% stenosis. Vertebrals:  Left vertebral artery demonstrates antegrade flow. Unable to               visualize right vertebral artery. Subclavians: Normal flow hemodynamics were seen in the left subclavian artery.              Unable to visualize the right subclavian artery. *See table(s) above for measurements and observations.     Preliminary    VAS US LOWER EXTREMITY VENOUS (DVT)  Result Date: 01/09/2020  Lower Venous Study Indications: Stroke.  Comparison Study: No prior study. Performing Technologist: Gertie FeyMichelle Simonetti MHA, RDMS, RVT, RDCS  Examination Guidelines: A complete evaluation includes B-mode imaging, spectral Doppler, color Doppler, and power Doppler as needed of all accessible portions of each vessel. Bilateral testing is considered an integral part of a complete examination. Limited examinations for reoccurring indications may be performed as noted.  +---------+---------------+---------+-----------+----------+--------------+ RIGHT    CompressibilityPhasicitySpontaneityPropertiesThrombus Aging +---------+---------------+---------+-----------+----------+--------------+ CFV      Full           Yes      Yes                                 +---------+---------------+---------+-----------+----------+--------------+ SFJ      Full                                                        +---------+---------------+---------+-----------+----------+--------------+ FV Prox  Full                                                        +---------+---------------+---------+-----------+----------+--------------+ FV Mid   Full                                                        +---------+---------------+---------+-----------+----------+--------------+ FV DistalFull                                                        +---------+---------------+---------+-----------+----------+--------------+ PFV      Full                                                        +---------+---------------+---------+-----------+----------+--------------+ POP      Full            Yes  Yes                                 +---------+---------------+---------+-----------+----------+--------------+ PTV      None                    No                                  +---------+---------------+---------+-----------+----------+--------------+   Right Technical Findings: Not visualized segments include peroneal veins.  +---------+---------------+---------+-----------+----------+--------------+ LEFT     CompressibilityPhasicitySpontaneityPropertiesThrombus Aging +---------+---------------+---------+-----------+----------+--------------+ CFV      Full           Yes      Yes                                 +---------+---------------+---------+-----------+----------+--------------+ SFJ      Full                                                        +---------+---------------+---------+-----------+----------+--------------+ FV Prox  Full                                                        +---------+---------------+---------+-----------+----------+--------------+ FV Mid   Full                                                        +---------+---------------+---------+-----------+----------+--------------+ FV DistalFull                                                        +---------+---------------+---------+-----------+----------+--------------+ PFV      Full                                                        +---------+---------------+---------+-----------+----------+--------------+ POP      Full           Yes      Yes                                 +---------+---------------+---------+-----------+----------+--------------+ PTV      Full                                                        +---------+---------------+---------+-----------+----------+--------------+ PERO  Full                                                         +---------+---------------+---------+-----------+----------+--------------+     Summary: Right: Findings consistent with acute deep vein thrombosis involving the right posterior tibial veins. No cystic structure found in the popliteal fossa. Left: There is no evidence of deep vein thrombosis in the lower extremity. No cystic structure found in the popliteal fossa.  *See table(s) above for measurements and observations. Electronically signed by Harold Barban MD on 01/09/2020 at 8:31:35 PM.    Final     Scheduled Meds: . aspirin EC  81 mg Oral Daily  . atorvastatin  40 mg Oral q1800  . clopidogrel  75 mg Oral Daily  . enoxaparin (LOVENOX) injection  40 mg Subcutaneous Q24H  . memantine  10 mg Oral BID  . rivastigmine  4.6 mg Transdermal Daily  . sodium chloride flush  3 mL Intravenous Q12H  . tuberculin  5 Units Intradermal Once    Continuous Infusions: . sodium chloride       Flora Lipps, MD  [facility] 01/10/2020

## 2020-01-10 NOTE — Plan of Care (Signed)
  Problem: Education: Goal: Knowledge of General Education information will improve Description: Including pain rating scale, medication(s)/side effects and non-pharmacologic comfort measures Outcome: Progressing   Problem: Health Behavior/Discharge Planning: Goal: Ability to manage health-related needs will improve Outcome: Progressing   Problem: Clinical Measurements: Goal: Ability to maintain clinical measurements within normal limits will improve Outcome: Progressing Goal: Will remain free from infection Outcome: Progressing Goal: Diagnostic test results will improve Outcome: Progressing Goal: Respiratory complications will improve Outcome: Progressing Goal: Cardiovascular complication will be avoided Outcome: Progressing   Problem: Activity: Goal: Risk for activity intolerance will decrease Outcome: Progressing   Problem: Nutrition: Goal: Adequate nutrition will be maintained Outcome: Progressing   Problem: Coping: Goal: Level of anxiety will decrease Outcome: Progressing   Problem: Elimination: Goal: Will not experience complications related to bowel motility Outcome: Progressing Goal: Will not experience complications related to urinary retention Outcome: Progressing   Problem: Pain Managment: Goal: General experience of comfort will improve Outcome: Progressing   Problem: Safety: Goal: Ability to remain free from injury will improve Outcome: Progressing   Problem: Skin Integrity: Goal: Risk for impaired skin integrity will decrease Outcome: Progressing   Problem: Education: Goal: Knowledge of disease or condition will improve Outcome: Progressing Goal: Knowledge of secondary prevention will improve Outcome: Progressing Goal: Knowledge of patient specific risk factors addressed and post discharge goals established will improve Outcome: Progressing Goal: Individualized Educational Video(s) Outcome: Progressing   Problem: Coping: Goal: Will verbalize  positive feelings about self Outcome: Progressing Goal: Will identify appropriate support needs Outcome: Progressing   

## 2020-01-10 NOTE — Progress Notes (Signed)
TCD bubble study  has been completed. Refer to [facility] under chart review to view preliminary results.   01/10/2020  4:05 PM Venice Marcucci, Gerarda Gunther

## 2020-01-11 LAB — BASIC METABOLIC PANEL
Anion gap: 10 (ref 5–15)
BUN: 23 mg/dL (ref 8–23)
CO2: 23 mmol/L (ref 22–32)
Calcium: 7.9 mg/dL — ABNORMAL LOW (ref 8.9–10.3)
Chloride: 104 mmol/L (ref 98–111)
Creatinine, Ser: 1.35 mg/dL — ABNORMAL HIGH (ref 0.61–1.24)
GFR calc Af Amer: 56 mL/min — ABNORMAL LOW (ref >60)
GFR calc non Af Amer: 49 mL/min — ABNORMAL LOW (ref >60)
Glucose, Bld: 118 mg/dL — ABNORMAL HIGH (ref 70–99)
Potassium: 4 mmol/L (ref 3.5–5.1)
Sodium: 137 mmol/L (ref 135–145)

## 2020-01-11 LAB — SARS CORONAVIRUS 2 (TAT 6-24 HRS): SARS Coronavirus 2: NEGATIVE

## 2020-01-11 NOTE — Progress Notes (Addendum)
PROGRESS NOTE  Kyle [name] OIB:[id number] DOB: 1936-12-09 DOA: 01/07/2020 PCP: Burton Apley, MD   LOS: 3 days   Brief narrative:  As per HPI,  Kyle [name] is a 84 y.o. male with medical history significant of advanced Alzheimer's dementia presenting to the ED for evaluation of left-sided weakness which started a week ago.  No history could be obtained from the patient given his baseline dementia.  History was obtained from wife at bedside.  Wife stated that for the past 1 week patient has had weakness in his left arm and left leg. When he was trying to walk using a walker but slid down on the floor.  He did not hit his head or sustain any injuries from the fall.   ED Course: Temperature 100.1 F.  Not tachycardic.  Slightly tachypneic.  Not hypotensive.  Labs showing no leukocytosis.  Blood glucose 123.  Creatinine 1.4, at baseline.  T bili 1.9, remainder of LFTs normal.  T bili was normal on labs done a year ago.  SARS-CoV-2 PCR test negative.  UA not suggestive of infection.  Chest x-ray showed cardiomegaly and no active disease.  Abdominal x-ray showed no acute findings.  Head CT was negative for acute intracranial hemorrhage but with multifocal hypodensities with loss of gray-white matter discrimination.  Brain MRI showed acute right ACA territory infarcts with mild petechial hemorrhage but no malignant hemorrhagic transformation or mass-effect.  Chronic left ACA territory infarct. Patient received a 500 cc normal saline bolus.  Patient was seen by neurology-outside window for thrombolysis or endovascular thrombectomy due to last known normal over a week ago.  Assessment/Plan:  Principal Problem:   Stroke [facility]) Active Problems:   Alzheimer disease (HCC)   Serum total bilirubin elevated  Acute ischemic stroke Presented with 1 week history of left-sided weakness.  Brain MRI showed acute right ACA territory infarct with mild petechial hemorrhage but no hemorrhagic transformation or  mass-effect.  Seen by neurology and patient was outside window for thrombolytic treatement.  Patient was then admitted for stroke work-up.  We will continue dual antiplatelets for 3 weeks then aspirin alone.  MR angiogram of the head showed occlusion of the right A2 segment corresponding to right anterior cerebral artery infarct.  2D echocardiogram performed on 01/08/2020 showed LV ejection fraction of 55 to 60%.  Carotid duplex ultrasound without significant stenosis and lower extremity vascular duplex  Ultrasound showed Right distal (posterior tibial veins) DVT.  Lipid panel reviewed with HDL of 34.  LDL 119.  Hemoglobin A1c of 5.5. Speech therapy  recommended dysphagia 2 diet. PT, OT recommend SNF. Status post bubble study due to DVT with no PFO. No plans for anticoagulation due to distal DVT and no PFO.   Dysphagia secondary to stroke.  Neurology on board. On Dual antiplatelet therapy.  On dysphagia 2 diet.  Advance diet as recommended by speech therapy.  Mild grade fever Resolved.  No leukocytosis.  Chest x-ray without any evidence of pneumonia.  Covid test was negative.  T-max of 98.3 at this time.  Urinalysis was negative.  Negative blood cultures, no indication for antibiotics at this time.  Elevated T bilirubin On Presentation.  Repeat CMP   Advanced Alzheimer's dementia Continue Namenda and Exelon patch.  Continue supportive care.  Left foot redness and swelling.  Improved. X-ray of the left foot showed arthrosis of the first metatarsophalangeal joint with soft tissue swelling.  Negative blood cultures   Right posterior tibial DVT.    Will need  to have serial outpatient ultrasound. patient is already on dual antiplatelets and has petechial hemorrhage in the brain.   Transcranial Doppler with bubble study was negative so no anticoagulation plan since there is no PFO.  VTE Prophylaxis: Lovenox subcu  Code Status: DO NOT RESUSCITATE  Family Communication:   I spoke with the patient's  son Mr Trejon Duford and updated him about the clinical condition of the patient.  Disposition Plan:    [facility] as per the social workers note.    Consultants:  Neurology  Procedures:  None  Antibiotics:  Anti-infectives (From admission, onward)   None     Subjective: Today, no interval complaints reported.    Objective: Vitals:   01/11/20 0830 01/11/20 1256  BP: 138/83 (!) 143/72  Pulse: (!) 48 63  Resp: 16 18  Temp: 98.5 F (36.9 C) (!) 97.5 F (36.4 C)  SpO2: 96% 96%    Intake/Output Summary (Last 24 hours) at 01/11/2020 1326 Last data filed at 01/11/2020 1300 Gross per 24 hour  Intake 360 ml  Output 2200 ml  Net -1840 ml   Filed Weights   01/08/20 1530  Weight: 88.7 kg   Body mass index is 26.52 kg/m.   Physical Exam:  GENERAL: Patient is alert awake and mildly communicative.  Has history of dementia.   HENT: No scleral pallor or icterus. Pupils equally reactive to light. Oral mucosa is moist NECK: is supple, no palpable thyroid enlargement. CHEST: Clear to auscultation. No crackles or wheezes. Non tender on palpation. Diminished breath sounds bilaterally. CVS: S1 and S2 heard, no murmur. Regular rate and rhythm. No pericardial rub. ABDOMEN: Soft, non-tender, bowel sounds are present. EXTREMITIES: On mittens on the left hand.  Left great toe arthrosis.  No redness. Left sided weakness more than the right.  CNS: Alert awake, and mildly communicative.  Has baseline dementia.  Has dysarthria. SKIN: warm and dry without rashes.  Data Review: I have personally reviewed the following laboratory data and studies,  CBC: Recent Labs  Lab 01/07/20 1814 01/07/20 1835 01/10/20 0337  WBC 10.4  --  8.0  NEUTROABS 7.8*  --   --   HGB 14.8 14.6 14.2  HCT 45.4 43.0 41.9  MCV 93.0  --  90.3  PLT 151  --  142*   Basic Metabolic Panel: Recent Labs  Lab 01/07/20 1814 01/07/20 1835 01/10/20 0337  01/11/20 0256  NA 137 139 137 137  K 3.9 3.9 4.2 4.0  CL 103  --  104 104  CO2 26  --  27 23  GLUCOSE 123*  --  111* 118*  BUN 17  --  23 23  CREATININE 1.40*  --  1.39* 1.35*  CALCIUM 8.3*  --  8.3* 7.9*   Liver Function Tests: Recent Labs  Lab 01/07/20 1814 01/08/20 0540  AST 20  --   ALT 24  --   ALKPHOS 74  --   BILITOT 1.9* 1.8*  PROT 6.3*  --   ALBUMIN 3.2*  --    No results for input(s): LIPASE, AMYLASE in the last 168 hours. No results for input(s): AMMONIA in the last 168 hours. Cardiac Enzymes: No results for input(s): CKTOTAL, CKMB, CKMBINDEX, TROPONINI in the last 168 hours. BNP (last 3 results) No results for input(s): BNP in the last 8760 hours.  ProBNP (last 3 results) No results for input(s): PROBNP in the last 8760 hours.  CBG: Recent Labs  Lab 01/07/20  1641 01/08/20 1232 01/08/20 2130  GLUCAP 92 93 104*   Recent Results (from the past 240 hour(s))  SARS CORONAVIRUS 2 (TAT 6-24 HRS) Nasopharyngeal Nasopharyngeal Swab     Status: None   Collection Time: 01/07/20  5:10 PM   Specimen: Nasopharyngeal Swab  Result Value Ref Range Status   SARS Coronavirus 2 NEGATIVE NEGATIVE Final    Comment: (NOTE) SARS-CoV-2 target nucleic acids are NOT DETECTED. The SARS-CoV-2 RNA is generally detectable in upper and lower respiratory specimens during the acute phase of infection. Negative results do not preclude SARS-CoV-2 infection, do not rule out co-infections with other pathogens, and should not be used as the sole basis for treatment or other patient management decisions. Negative results must be combined with clinical observations, patient history, and epidemiological information. The expected result is Negative. Fact Sheet for Patients: SugarRoll.be Fact Sheet for Healthcare Providers: https://www.woods-mathews.com/ This test is not yet approved or cleared by the [place] FDA and  has been authorized for  detection and/or diagnosis of SARS-CoV-2 by FDA under an Emergency Use Authorization (EUA). This EUA will remain  in effect (meaning this test can be used) for the duration of the COVID-19 declaration under Section 56 4(b)(1) of the Act, 21 U.S.C. section 360bbb-3(b)(1), unless the authorization is terminated or revoked sooner. Performed at [facility], [place] [address], [place], [place] [address]   Culture, blood (Routine X 2) w Reflex to ID Panel     Status: None (Preliminary result)   Collection Time: 01/08/20  6:05 PM   Specimen: BLOOD  Result Value Ref Range Status   Specimen Description BLOOD LEFT ANTECUBITAL  Final   Special Requests   Final    BOTTLES DRAWN AEROBIC AND ANAEROBIC Blood Culture adequate volume   Culture   Final    NO GROWTH 2 DAYS Performed at [facility], [place] [address] [place], [place] [address]    Report Status PENDING  Incomplete  Culture, blood (Routine X 2) w Reflex to ID Panel     Status: None (Preliminary result)   Collection Time: 01/08/20  6:15 PM   Specimen: BLOOD RIGHT ARM  Result Value Ref Range Status   Specimen Description BLOOD RIGHT ARM  Final   Special Requests   Final    BOTTLES DRAWN AEROBIC AND ANAEROBIC Blood Culture results may not be optimal due to an inadequate volume of blood received in culture bottles   Culture   Final    NO GROWTH 2 DAYS Performed at [facility], [place] [address] [place], [place] [address]    Report Status PENDING  Incomplete  SARS CORONAVIRUS 2 (TAT 6-24 HRS) Nasopharyngeal Nasopharyngeal Swab     Status: None   Collection Time: 01/11/20  2:14 AM   Specimen: Nasopharyngeal Swab  Result Value Ref Range Status   SARS Coronavirus 2 NEGATIVE NEGATIVE Final    Comment: (NOTE) SARS-CoV-2 target nucleic acids are NOT DETECTED. The SARS-CoV-2 RNA is generally detectable in upper and lower respiratory specimens during the acute phase of infection. Negative results do not preclude SARS-CoV-2  infection, do not rule out co-infections with other pathogens, and should not be used as the sole basis for treatment or other patient management decisions. Negative results must be combined with clinical observations, patient history, and epidemiological information. The expected result is Negative. Fact Sheet for Patients: SugarRoll.be Fact Sheet for Healthcare Providers: https://www.woods-mathews.com/ This test is not yet approved or cleared by the [place] FDA and  has been authorized for detection and/or diagnosis of SARS-CoV-2 by FDA under an Emergency Use Authorization (EUA). This EUA will remain  in effect (meaning this test can be used) for the duration of the COVID-19 declaration under Section 56 4(b)(1) of the Act, 21 U.S.C. section 360bbb-3(b)(1), unless the authorization is terminated or revoked sooner. Performed at [facility], [address], [place], [place] [address]      Studies: VAS [place] TRANSCRANIAL DOPPLER W BUBBLES  Result Date: 01/10/2020  Transcranial Doppler with Bubble Indications: Stroke. Performing Technologist: Marilynne Halsted RDMS, RVT  Examination Guidelines: A complete evaluation includes B-mode imaging, spectral Doppler, color Doppler, and power Doppler as needed of all accessible portions of each vessel. Bilateral testing is considered an integral part of a complete examination. Limited examinations for reoccurring indications may be performed as noted.  Summary:  A vascular evaluation was performed. The left middle cerebral artery was studied. An IV was inserted into the patient's right forearm. Verbal informed consent was obtained.  Dr. Roda Shutters performed. No HITS at rest or during Valsalva. No apparent PFO. Negative transcranial Doppler bubble study. *See table(s) above for TCD measurements and observations.    Preliminary    VAS US CAROTID  Result Date: 01/10/2020 Carotid Arterial Duplex Study Indications:        CVA. Comparison Study:  No prior study Performing Technologist: Gertie Fey MHA, RDMS, RVT, RDCS  Examination Guidelines: A complete evaluation includes B-mode imaging, spectral Doppler, color Doppler, and power Doppler as needed of all accessible portions of each vessel. Bilateral testing is considered an integral part of a complete examination. Limited examinations for reoccurring indications may be performed as noted.  Right Carotid Findings: +----------+--------+-------+--------+----------------------+------------------+           PSV cm/sEDV    StenosisPlaque Description    Comments                             cm/s                                                    +----------+--------+-------+--------+----------------------+------------------+ CCA Prox  101     11                                   intimal thickening +----------+--------+-------+--------+----------------------+------------------+ CCA Distal75      9              smooth and                                                                heterogenous                             +----------+--------+-------+--------+----------------------+------------------+ ICA Prox  44      11             smooth and  heterogenous                             +----------+--------+-------+--------+----------------------+------------------+ Unable to visualize distal ICA, ECA, vertebral artery, subclavian artery. Left Carotid Findings: +----------+--------+--------+--------+-----------------------+--------+           PSV cm/sEDV cm/sStenosisPlaque Description     Comments +----------+--------+--------+--------+-----------------------+--------+ CCA Prox  170     16                                              +----------+--------+--------+--------+-----------------------+--------+ CCA Distal78      13              smooth and  heterogenous         +----------+--------+--------+--------+-----------------------+--------+ ICA Prox  42      7                                               +----------+--------+--------+--------+-----------------------+--------+ ICA Distal59      12                                              +----------+--------+--------+--------+-----------------------+--------+ ECA       124     6                                               +----------+--------+--------+--------+-----------------------+--------+ +----------+--------+--------+----------------+-------------------+           PSV cm/sEDV cm/sDescribe        Arm Pressure (mmHG) +----------+--------+--------+----------------+-------------------+ [id number]              Multiphasic, WNL                    +----------+--------+--------+----------------+-------------------+ +---------+--------+--+--------+-+---------+ VertebralPSV cm/s18EDV cm/s4Antegrade +---------+--------+--+--------+-+---------+  Summary: Right Carotid: Velocities in the right ICA are consistent with a 1-39% stenosis. Left Carotid: Velocities in the left ICA are consistent with a 1-39% stenosis. Vertebrals:  Left vertebral artery demonstrates antegrade flow. Unable to              visualize right vertebral artery. Subclavians: Normal flow hemodynamics were seen in the left subclavian artery.              Unable to visualize the right subclavian artery. *See table(s) above for measurements and observations.  Electronically signed by Delia HeadyPramod Sethi MD on 01/10/2020 at 8:27:56 AM.    Final    VAS US LOWER EXTREMITY VENOUS (DVT)  Result Date: 01/09/2020  Lower Venous Study Indications: Stroke.  Comparison Study: No prior study. Performing Technologist: Gertie FeyMichelle Simonetti MHA, RDMS, RVT, RDCS  Examination Guidelines: A complete evaluation includes B-mode imaging, spectral Doppler, color Doppler, and power Doppler as needed of all accessible portions of  each vessel. Bilateral testing is considered an integral part of a complete examination. Limited examinations for reoccurring indications may be performed as noted.  +---------+---------------+---------+-----------+----------+--------------+ RIGHT    CompressibilityPhasicitySpontaneityPropertiesThrombus Aging +---------+---------------+---------+-----------+----------+--------------+ CFV      Full  Yes      Yes                                 +---------+---------------+---------+-----------+----------+--------------+ SFJ      Full                                                        +---------+---------------+---------+-----------+----------+--------------+ FV Prox  Full                                                        +---------+---------------+---------+-----------+----------+--------------+ FV Mid   Full                                                        +---------+---------------+---------+-----------+----------+--------------+ FV DistalFull                                                        +---------+---------------+---------+-----------+----------+--------------+ PFV      Full                                                        +---------+---------------+---------+-----------+----------+--------------+ POP      Full           Yes      Yes                                 +---------+---------------+---------+-----------+----------+--------------+ PTV      None                    No                                  +---------+---------------+---------+-----------+----------+--------------+   Right Technical Findings: Not visualized segments include peroneal veins.  +---------+---------------+---------+-----------+----------+--------------+ LEFT     CompressibilityPhasicitySpontaneityPropertiesThrombus Aging +---------+---------------+---------+-----------+----------+--------------+ CFV      Full           Yes       Yes                                 +---------+---------------+---------+-----------+----------+--------------+ SFJ      Full                                                        +---------+---------------+---------+-----------+----------+--------------+  FV Prox  Full                                                        +---------+---------------+---------+-----------+----------+--------------+ FV Mid   Full                                                        +---------+---------------+---------+-----------+----------+--------------+ FV DistalFull                                                        +---------+---------------+---------+-----------+----------+--------------+ PFV      Full                                                        +---------+---------------+---------+-----------+----------+--------------+ POP      Full           Yes      Yes                                 +---------+---------------+---------+-----------+----------+--------------+ PTV      Full                                                        +---------+---------------+---------+-----------+----------+--------------+ PERO     Full                                                        +---------+---------------+---------+-----------+----------+--------------+     Summary: Right: Findings consistent with acute deep vein thrombosis involving the right posterior tibial veins. No cystic structure found in the popliteal fossa. Left: There is no evidence of deep vein thrombosis in the lower extremity. No cystic structure found in the popliteal fossa.  *See table(s) above for measurements and observations. Electronically signed by Coral Else MD on 01/09/2020 at 8:31:35 PM.    Final     Scheduled Meds: . aspirin EC  81 mg Oral Daily  . atorvastatin  40 mg Oral q1800  . clopidogrel  75 mg Oral Daily  . enoxaparin (LOVENOX) injection  40 mg Subcutaneous Q24H  .  memantine  10 mg Oral BID  . rivastigmine  4.6 mg Transdermal Daily  . sodium chloride flush  3 mL Intravenous Q12H  . tuberculin  5 Units Intradermal Once    Continuous Infusions: . sodium chloride       Joycelyn Das, MD  [facility] 01/11/2020

## 2020-01-11 NOTE — Progress Notes (Signed)
Physical Therapy Treatment Patient Details Name: Kyle [name] MRN: [id number] DOB: 03/18/1936 Today's Date: 01/11/2020    History of Present Illness Kyle [name] is a 84 y.o. male past medical history of advanced dementia. Presented to the hospital for the frequent falls and left leg paralysis and worsening of the left leg weakness and 7-8 days of decreased verbal output. 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    PT Comments    Pt making very slow progress towards physical therapy goals; continues with LLE flaccidity and increased RLE stiffness noted today. Requiring two person maximal assist for bed mobility and unable to stand with use of Stedy. Session focused on static/dynamic sitting balance, promoting left sided attention, cervical stretching. Pt with right gaze preference. D/c plan remains appropriate.     Follow Up Recommendations  SNF;Supervision/Assistance - 24 hour     Equipment Recommendations  Hospital bed;Other (comment)(hoyer lift)    Recommendations for Other Services       Precautions / Restrictions Precautions Precautions: Fall;Other (comment) Precaution Comments: L hemiplegia, inattention Restrictions Weight Bearing Restrictions: No    Mobility  Bed Mobility Overal bed mobility: Needs Assistance Bed Mobility: Supine to Sit;Sit to Supine     Supine to sit: Max assist;+2 for physical assistance Sit to supine: Max assist;+2 for physical assistance   General bed mobility comments: MaxA + 2 for supine <> sit; pt with little initiation, requiring max cues for functional use of RUE on railing to push off  Transfers Overall transfer level: Needs assistance Equipment used: Ambulation equipment used             General transfer comment: Unable to achieve adequate hip clearance with use of Stedy  Ambulation/Gait                 Stairs             Wheelchair Mobility    Modified Rankin  (Stroke Patients Only) Modified Rankin (Stroke Patients Only) Pre-Morbid Rankin Score: Moderately severe disability Modified Rankin: Severe disability     Balance Overall balance assessment: Needs assistance Sitting-balance support: Feet supported;Single extremity supported Sitting balance-Leahy Scale: Poor Sitting balance - Comments: Pt requiring mod-maxA for sitting balance, consistent left and posterior lean                                    Cognition Arousal/Alertness: Awake/alert Behavior During Therapy: WFL for tasks assessed/performed Overall Cognitive Status: History of cognitive impairments - at baseline                                 General Comments: Following <25% of commands with max multimodal cueing      Exercises Other Exercises Other Exercises: Sitting balance: head turns in all directions, cervical stretch into left rotation, truncal rotation, functional reaching    General Comments        Pertinent Vitals/Pain Pain Assessment: Faces Faces Pain Scale: Hurts a little bit Pain Location: grimacing with cervical positioning/stretching Pain Descriptors / Indicators: Grimacing Pain Intervention(s): Monitored during session    Home Living                      Prior Function            PT Goals (current goals can now be  found in the care plan section) Acute Rehab PT Goals Potential to Achieve Goals: Fair    Frequency    Min 3X/week      PT Plan Current plan remains appropriate    Co-evaluation              AM-PAC PT "6 Clicks" Mobility   Outcome Measure  Help needed turning from your back to your side while in a flat bed without using bedrails?: Total Help needed moving from lying on your back to sitting on the side of a flat bed without using bedrails?: Total Help needed moving to and from a bed to a chair (including a wheelchair)?: Total Help needed standing up from a chair using your arms  (e.g., wheelchair or bedside chair)?: Total Help needed to walk in hospital room?: Total Help needed climbing 3-5 steps with a railing? : Total 6 Click Score: 6    End of Session   Activity Tolerance: Patient tolerated treatment well Patient left: in bed;with call bell/phone within reach;with bed alarm set;with family/visitor present Nurse Communication: Mobility status PT Visit Diagnosis: Other abnormalities of gait and mobility (R26.89);Hemiplegia and hemiparesis;Other symptoms and signs involving the nervous system (R29.898) Hemiplegia - Right/Left: Left Hemiplegia - dominant/non-dominant: Non-dominant Hemiplegia - caused by: Cerebral infarction     Time: 0092-3300 PT Time Calculation (min) (ACUTE ONLY): 25 min  Charges:  $Therapeutic Activity: 23-37 mins                     Ellamae Sia, PT, DPT Acute Rehabilitation Services Pager [phone] Office [phone]    Willy Eddy 01/11/2020, 1:26 PM

## 2020-01-11 NOTE — Progress Notes (Addendum)
  Speech Language Pathology Treatment: Dysphagia  Patient Details Name: Kyle [name] MRN: [id number] DOB: 08-May-1936 Today's Date: 01/11/2020 Time: 9211-9417 SLP Time Calculation (min) (ACUTE ONLY): 16 min  Assessment / Plan / Recommendation Clinical Impression  Patient seen at bedside for dysphagia treatment. Pt in bed, HOB raised.  Patient's wife present at bedside throughout session. Patient presents with suspected cognitive dysphagia primarily affecting oral phase. Patient alert, able to follow some one step verbal commands with max verbal cues. Pt allowing ST to perform oral care with toothette via in-line suction.  Patient provided cup sips thin liquid water, pt with mild anterior labial spillage noted. Pt then swishing water in mouth and performing oral hold until ST held cup out and cued to pt to expel water. Pt able to expel water into cup. ST attempted to provide straw sips of water, patient not attempting to siphon water through straw. Pt wife reporting patient enjoys sprite, so ST provided sprite via straw, pt able siphon liquid, but again demonstrating oral holding and swishing, expelling liquid into cup. When ST presented sprite in sprite bottle, pt able to take small cup sip, good oral transit and initiating the swallow. No overt s/s aspiration seen with the thin liquid trials. Pt refusing solid PO trials initially (holding hand up/guarding), but agreeable to jello. Pt with adequate bolus acceptance, some lingual manipulation before swallowing. No overt s/s aspiration seen. Patient and wife educated re: aspiration precautions and compensatory strategies including oral care at least 2x/day, sitting upright for all PO intake, taking small bites and sips and only eating/drinking if alert. Reinforcement likely needed for carryover.  Recommend continue dysphagia 2/ thin liquids. At end of session, patient attempting to pull IV from right arm, RN alerted and RN entered to check.    HPI HPI:  84yo male admitted 01/07/20 with left side weakness x1 week. PMH: advanced dementia.  MRI = [facility] Acute Right ACA territory infarcts with mild petechial hemorrhage but no malignant hemorrhagic transformation or mass effect. Chronic left ACA territory infarct.      SLP Plan  Continue with current plan of care       Recommendations  Diet recommendations: Thin liquid;Dysphagia 1 (puree) Liquids provided via: Straw;Cup;Teaspoon Medication Administration: Crushed with puree Supervision: Full supervision/cueing for compensatory strategies Compensations: Minimize environmental distractions;Slow rate;Small sips/bites Postural Changes and/or Swallow Maneuvers: Seated upright 90 degrees   Diet recommendation: thin liquids and dysphagia 2.             Oral Care Recommendations: Oral care BID Follow up Recommendations: Skilled Nursing facility;24 hour supervision/assistance SLP Visit Diagnosis: Dysphagia, unspecified (R13.10) Plan: Continue with current plan of care       GO              Shella Spearing, M.Ed., CCC-SLP Speech Therapy Acute Rehabilitation [phone]   Shella Spearing 01/11/2020, 2:59 PM

## 2020-01-11 NOTE — TOC Progression Note (Signed)
Transition of Care [facility]) - Progression Note    Patient Details  Name: MANDRELL VANGILDER MRN: [id number] Date of Birth: 1936-03-30  [facility]) CM/SW Contact  Kermit Balo, RN Phone Number: 01/11/2020, 4:00 PM  Clinical Narrative:    [facility] and they wont have a bed until Monday. Son, pt and wife updated.  TOC following.   Expected Discharge Plan: Long Term Nursing Home Barriers to Discharge: Continued Medical Work up, Conservator, museum/gallery and Services Expected Discharge Plan: Long Term Nursing Home     Post Acute Care Choice: Nursing Home Living arrangements for the past 2 months: Single Family Home                                       Social Determinants of Health (SDOH) Interventions    Readmission Risk Interventions No flowsheet data found.

## 2020-01-12 LAB — COMPREHENSIVE METABOLIC PANEL
ALT: 21 U/L (ref 0–44)
AST: 24 U/L (ref 15–41)
Albumin: 2.6 g/dL — ABNORMAL LOW (ref 3.5–5.0)
Alkaline Phosphatase: 76 U/L (ref 38–126)
Anion gap: 9 (ref 5–15)
BUN: 25 mg/dL — ABNORMAL HIGH (ref 8–23)
CO2: 23 mmol/L (ref 22–32)
Calcium: 8.4 mg/dL — ABNORMAL LOW (ref 8.9–10.3)
Chloride: 105 mmol/L (ref 98–111)
Creatinine, Ser: 1.29 mg/dL — ABNORMAL HIGH (ref 0.61–1.24)
GFR calc Af Amer: 59 mL/min — ABNORMAL LOW (ref >60)
GFR calc non Af Amer: 51 mL/min — ABNORMAL LOW (ref >60)
Glucose, Bld: 143 mg/dL — ABNORMAL HIGH (ref 70–99)
Potassium: 4 mmol/L (ref 3.5–5.1)
Sodium: 137 mmol/L (ref 135–145)
Total Bilirubin: 0.9 mg/dL (ref 0.3–1.2)
Total Protein: 6 g/dL — ABNORMAL LOW (ref 6.5–8.1)

## 2020-01-12 NOTE — Progress Notes (Signed)
PROGRESS NOTE  Kyle [name] QIO:[id number] DOB: Jun 18, 1936 DOA: 01/07/2020 PCP: Burton Apley, MD   LOS: 4 days   Brief narrative:  As per HPI,  Kyle [name] is a 84 y.o. male with medical history significant of advanced Alzheimer's dementia presenting to the ED for evaluation of left-sided weakness which started a week ago.  No history could be obtained from the patient given his baseline dementia.  History was obtained from wife at bedside.  Wife stated that for the past 1 week patient has had weakness in his left arm and left leg. When he was trying to walk using a walker but slid down on the floor.  He did not hit his head or sustain any injuries from the fall.   ED Course: Temperature 100.1 F.  Not tachycardic.  Slightly tachypneic.  Not hypotensive.  Labs showing no leukocytosis.  Blood glucose 123.  Creatinine 1.4, at baseline.  T bili 1.9, remainder of LFTs normal.  T bili was normal on labs done a year ago.  SARS-CoV-2 PCR test negative.  UA not suggestive of infection.  Chest x-ray showed cardiomegaly and no active disease.  Abdominal x-ray showed no acute findings.  Head CT was negative for acute intracranial hemorrhage but with multifocal hypodensities with loss of gray-white matter discrimination.  Brain MRI showed acute right ACA territory infarcts with mild petechial hemorrhage but no malignant hemorrhagic transformation or mass-effect.  Chronic left ACA territory infarct. Patient received a 500 cc normal saline bolus.  Patient was seen by neurology-outside window for thrombolysis or endovascular thrombectomy due to last known normal over a week ago.  Assessment/Plan:  Principal Problem:   Stroke [facility]) Active Problems:   Alzheimer disease (HCC)   Serum total bilirubin elevated  Acute ischemic stroke Presented with 1 week history of left-sided weakness.  Brain MRI showed acute right ACA territory infarct with mild petechial hemorrhage but no hemorrhagic transformation or  mass-effect.  Seen by neurology and patient was outside window for thrombolytic treatement.  Patient was then admitted for stroke work-up.  We will continue dual antiplatelets for 3 weeks then aspirin alone.  MR angiogram of the head showed occlusion of the right A2 segment corresponding to right anterior cerebral artery infarct.  2D echocardiogram performed on 01/08/2020 showed LV ejection fraction of 55 to 60%.  Carotid duplex ultrasound without significant stenosis and lower extremity vascular duplex  Ultrasound showed Right distal (posterior tibial veins) DVT.  Lipid panel reviewed with HDL of 34.  LDL 119.  Hemoglobin A1c of 5.5. Speech therapy  recommended dysphagia 2 diet. PT, OT recommend SNF. Status post bubble study due to DVT with no PFO. No plans for anticoagulation due to distal DVT and no PFO.   Dysphagia secondary to stroke.  Neurology on board. On Dual antiplatelet therapy.  On dysphagia 2 diet.  Advance diet as recommended by speech therapy.  Mild grade fever Resolved.  No leukocytosis.  Chest x-ray without any evidence of pneumonia.  Covid test was negative.  T-max of 98.3 at this time.  Urinalysis was negative.  Negative blood cultures no indication for antibiotics at this time.  Elevated T bilirubin Improved.  On Presentation.  Repeat CMP from today shows normal LFTs including bilirubin.  Advanced Alzheimer's dementia Continue Namenda and Exelon patch.  Continue supportive care.  Left foot redness and swelling.  Improved. X-ray of the left foot showed arthrosis of the first metatarsophalangeal joint with soft tissue swelling.  Negative blood cultures in 2 days.  Right posterior tibial DVT.    Patient is already on dual antiplatelets and has petechial hemorrhage in the brain.   Transcranial Doppler with bubble study was negative so no anticoagulation plan since there is no PFO.Will need serial ultrasound as outpatient.  VTE Prophylaxis: Lovenox subcu  Code Status: DO NOT  RESUSCITATE  Family Communication: Spoke with the patient's wife at bedside today.  I spoke with the patient's son Mr Kyle [name] yesterday.  Disposition Plan:    Medically stable for disposition.  [facility] [employer] [facility] pending as per the social workers note.    Consultants:  Neurology  Procedures:  None  Antibiotics:  Anti-infectives (From admission, onward)   None     Subjective: Today, patient's wife at bedside.  No interval complaints reported.  Was able to eat food.  Has underlying dementia.  Not much communicative.  He is likely at his baseline.   Objective: Vitals:   01/12/20 0402 01/12/20 0824  BP: 139/74 (!) 170/86  Pulse: 63 66  Resp: 16 18  Temp: 98 F (36.7 C) 98 F (36.7 C)  SpO2:  94%    Intake/Output Summary (Last 24 hours) at 01/12/2020 1112 Last data filed at 01/12/2020 0403 Gross per 24 hour  Intake 360 ml  Output 1500 ml  Net -1140 ml   Filed Weights   01/08/20 1530  Weight: 88.7 kg   Body mass index is 26.52 kg/m.   Physical Exam:  GENERAL: Patient is alert awake and mildly communicative.  Has history of dementia.   HENT: No scleral pallor or icterus. Pupils equally reactive to light. Oral mucosa is moist NECK: is supple, no palpable thyroid enlargement. CHEST: Clear to auscultation. No crackles or wheezes. Non tender on palpation. Diminished breath sounds bilaterally. CVS: S1 and S2 heard, no murmur. Regular rate and rhythm. No pericardial rub. ABDOMEN: Soft, non-tender, bowel sounds are present. EXTREMITIES: On mittens.  Left great toe arthrosis.  No redness. Left sided weakness more than the right.  CNS: Alert awake, and mildly communicative.  Has baseline dementia.  Has dysarthria. SKIN: warm and dry without rashes.  Data Review: I have personally reviewed the following laboratory data and studies,  CBC: Recent Labs  Lab 01/07/20 1814 01/07/20 1835 01/10/20 0337  WBC 10.4  --  8.0   NEUTROABS 7.8*  --   --   HGB 14.8 14.6 14.2  HCT 45.4 43.0 41.9  MCV 93.0  --  90.3  PLT 151  --  509*   Basic Metabolic Panel: Recent Labs  Lab 01/07/20 1814 01/07/20 1835 01/10/20 0337 01/11/20 0256 01/12/20 0308  NA 137 139 137 137 137  K 3.9 3.9 4.2 4.0 4.0  CL 103  --  104 104 105  CO2 26  --  27 23 23   GLUCOSE 123*  --  111* 118* 143*  BUN 17  --  23 23 25*  CREATININE 1.40*  --  1.39* 1.35* 1.29*  CALCIUM 8.3*  --  8.3* 7.9* 8.4*   Liver Function Tests: Recent Labs  Lab 01/07/20 1814 01/08/20 0540 01/12/20 0308  AST 20  --  24  ALT 24  --  21  ALKPHOS 74  --  76  BILITOT 1.9* 1.8* 0.9  PROT 6.3*  --  6.0*  ALBUMIN 3.2*  --  2.6*   No results for input(s): LIPASE, AMYLASE in the last 168 hours. No results for input(s): AMMONIA in the last 168 hours. Cardiac Enzymes: No results  for input(s): CKTOTAL, CKMB, CKMBINDEX, TROPONINI in the last 168 hours. BNP (last 3 results) No results for input(s): BNP in the last 8760 hours.  ProBNP (last 3 results) No results for input(s): PROBNP in the last 8760 hours.  CBG: Recent Labs  Lab 01/07/20 1641 01/08/20 1232 01/08/20 2130  GLUCAP 92 93 104*   Recent Results (from the past 240 hour(s))  SARS CORONAVIRUS 2 (TAT 6-24 HRS) Nasopharyngeal Nasopharyngeal Swab     Status: None   Collection Time: 01/07/20  5:10 PM   Specimen: Nasopharyngeal Swab  Result Value Ref Range Status   SARS Coronavirus 2 NEGATIVE NEGATIVE Final    Comment: (NOTE) SARS-CoV-2 target nucleic acids are NOT DETECTED. The SARS-CoV-2 RNA is generally detectable in upper and lower respiratory specimens during the acute phase of infection. Negative results do not preclude SARS-CoV-2 infection, do not rule out co-infections with other pathogens, and should not be used as the sole basis for treatment or other patient management decisions. Negative results must be combined with clinical observations, patient history, and epidemiological  information. The expected result is Negative. Fact Sheet for Patients: HairSlick.no Fact Sheet for Healthcare Providers: quierodirigir.com This test is not yet approved or cleared by the [place] FDA and  has been authorized for detection and/or diagnosis of SARS-CoV-2 by FDA under an Emergency Use Authorization (EUA). This EUA will remain  in effect (meaning this test can be used) for the duration of the COVID-19 declaration under Section 56 4(b)(1) of the Act, 21 U.S.C. section 360bbb-3(b)(1), unless the authorization is terminated or revoked sooner. Performed at [facility], [address], [place], [place] [address]   Culture, blood (Routine X 2) w Reflex to ID Panel     Status: None (Preliminary result)   Collection Time: 01/08/20  6:05 PM   Specimen: BLOOD  Result Value Ref Range Status   Specimen Description BLOOD LEFT ANTECUBITAL  Final   Special Requests   Final    BOTTLES DRAWN AEROBIC AND ANAEROBIC Blood Culture adequate volume   Culture   Final    NO GROWTH 4 DAYS Performed at [facility], [address] [place], [place] [address]    Report Status PENDING  Incomplete  Culture, blood (Routine X 2) w Reflex to ID Panel     Status: None (Preliminary result)   Collection Time: 01/08/20  6:15 PM   Specimen: BLOOD RIGHT ARM  Result Value Ref Range Status   Specimen Description BLOOD RIGHT ARM  Final   Special Requests   Final    BOTTLES DRAWN AEROBIC AND ANAEROBIC Blood Culture results may not be optimal due to an inadequate volume of blood received in culture bottles   Culture   Final    NO GROWTH 4 DAYS Performed at [facility], [address], [place], [place] [address]    Report Status PENDING  Incomplete  SARS CORONAVIRUS 2 (TAT 6-24 HRS) Nasopharyngeal Nasopharyngeal Swab     Status: None   Collection Time: 01/11/20  2:14 AM   Specimen: Nasopharyngeal Swab  Result Value Ref Range  Status   SARS Coronavirus 2 NEGATIVE NEGATIVE Final    Comment: (NOTE) SARS-CoV-2 target nucleic acids are NOT DETECTED. The SARS-CoV-2 RNA is generally detectable in upper and lower respiratory specimens during the acute phase of infection. Negative results do not preclude SARS-CoV-2 infection, do not rule out co-infections with other pathogens, and should not be used as the sole basis for treatment or  other patient management decisions. Negative results must be combined with clinical observations, patient history, and epidemiological information. The expected result is Negative. Fact Sheet for Patients: HairSlick.no Fact Sheet for Healthcare Providers: quierodirigir.com This test is not yet approved or cleared by the [place] FDA and  has been authorized for detection and/or diagnosis of SARS-CoV-2 by FDA under an Emergency Use Authorization (EUA). This EUA will remain  in effect (meaning this test can be used) for the duration of the COVID-19 declaration under Section 56 4(b)(1) of the Act, 21 U.S.C. section 360bbb-3(b)(1), unless the authorization is terminated or revoked sooner. Performed at [facility], [address] [place], [place] [address]      Studies: VAS [place] TRANSCRANIAL DOPPLER W BUBBLES  Result Date: 01/10/2020  Transcranial Doppler with Bubble Indications: Stroke. Performing Technologist: Marilynne Halsted RDMS, RVT  Examination Guidelines: A complete evaluation includes B-mode imaging, spectral Doppler, color Doppler, and power Doppler as needed of all accessible portions of each vessel. Bilateral testing is considered an integral part of a complete examination. Limited examinations for reoccurring indications may be performed as noted.  Summary:  A vascular evaluation was performed. The left middle cerebral artery was studied. An IV was inserted into the patient's right forearm. Verbal informed consent  was obtained.  Dr. Roda Shutters performed. No HITS at rest or during Valsalva. No apparent PFO. Negative transcranial Doppler bubble study. *See table(s) above for TCD measurements and observations.    Preliminary     Scheduled Meds: . aspirin EC  81 mg Oral Daily  . atorvastatin  40 mg Oral q1800  . clopidogrel  75 mg Oral Daily  . enoxaparin (LOVENOX) injection  40 mg Subcutaneous Q24H  . memantine  10 mg Oral BID  . rivastigmine  4.6 mg Transdermal Daily  . sodium chloride flush  3 mL Intravenous Q12H    Continuous Infusions: . sodium chloride       Joycelyn Das, MD  [facility] 01/12/2020

## 2020-01-13 LAB — CULTURE, BLOOD (ROUTINE X 2)
Culture: NO GROWTH
Culture: NO GROWTH
Special Requests: ADEQUATE

## 2020-01-13 NOTE — Progress Notes (Signed)
PROGRESS NOTE  [name]:[id number] DOB: 15-Mar-1936 DOA: 01/07/2020 PCP: Lorene Dy, MD   LOS: 5 days   Brief narrative:  As per HPI,  Kyle [name] is a 84 y.o. male with medical history significant of advanced Alzheimer's dementia presenting to the ED for evaluation of left-sided weakness which started a week ago.  No history could be obtained from the patient given his baseline dementia.  History was obtained from wife at bedside.  Wife stated that for the past 1 week patient has had weakness in his left arm and left leg. When he was trying to walk using a walker but slid down on the floor.  He did not hit his head or sustain any injuries from the fall.   ED Course: Temperature 100.1 F.  Not tachycardic.  Slightly tachypneic.  Not hypotensive.  Labs showing no leukocytosis.  Blood glucose 123.  Creatinine 1.4, at baseline.  T bili 1.9, remainder of LFTs normal.  T bili was normal on labs done a year ago.  SARS-CoV-2 PCR test negative.  UA not suggestive of infection.  Chest x-ray showed cardiomegaly and no active disease.  Abdominal x-ray showed no acute findings.  Head CT was negative for acute intracranial hemorrhage but with multifocal hypodensities with loss of gray-white matter discrimination.  Brain MRI showed acute right ACA territory infarcts with mild petechial hemorrhage but no malignant hemorrhagic transformation or mass-effect.  Chronic left ACA territory infarct. Patient received a 500 cc normal saline bolus.  Patient was seen by neurology-outside window for thrombolysis or endovascular thrombectomy due to last known normal over a week ago.  Assessment/Plan:  Principal Problem:   Stroke [facility]) Active Problems:   Alzheimer disease (HCC)   Serum total bilirubin elevated  Acute ischemic stroke Presented with 1 week history of left-sided weakness.  Brain MRI showed acute right ACA territory infarct with mild petechial hemorrhage but no hemorrhagic transformation or  mass-effect.  Seen by neurology and patient was outside window for thrombolytic treatement.  Patient was then admitted for stroke work-up.  We will continue dual antiplatelets for 3 weeks then aspirin alone.  MR angiogram of the head showed occlusion of the right A2 segment corresponding to right anterior cerebral artery infarct.  2D echocardiogram performed on 01/08/2020 showed LV ejection fraction of 55 to 60%.  Carotid duplex ultrasound without significant stenosis and lower extremity vascular duplex  Ultrasound showed Right distal (posterior tibial veins) DVT.  Lipid panel reviewed with HDL of 34.  LDL 119.  Hemoglobin A1c of 5.5. Speech therapy  recommended dysphagia 2 diet. PT, OT recommend SNF. Status post bubble study due to DVT with no PFO. No plans for anticoagulation due to distal DVT and no PFO.   Dysphagia secondary to stroke.  Neurology on board. On Dual antiplatelet therapy.  On dysphagia 2 diet.  Advance diet as recommended by speech therapy.  Mild grade fever Resolved.  No leukocytosis.  Chest x-ray without any evidence of pneumonia.  Covid test was negative.  T-max of 98.3 at this time.  Urinalysis was negative.  Negative blood cultures, no indication for antibiotics at this time.  Elevated T bilirubin Improved.  On Presentation.  Repeat CMP  showed normal LFTs including bilirubin.  Advanced Alzheimer's dementia Continue Namenda and Exelon patch.  Continue supportive care.  Left foot redness and swelling.  Improved. X-ray of the left foot showed arthrosis of the first metatarsophalangeal joint with soft tissue swelling.  Negative blood cultures in 2 days.  Right posterior tibial DVT.    Patient is already on dual antiplatelets and has petechial hemorrhage in the brain.   Transcranial Doppler with bubble study was negative so no anticoagulation plan since there is no PFO.Will need serial ultrasound as outpatient.  VTE Prophylaxis: Lovenox subcu  Code Status: DO NOT RESUSCITATE   Family Communication:  None today. Spoke with the patient's wife at bedside yesterday.  Disposition Plan:  Medically stable for disposition.  [facility]   Consultants:  Neurology  Procedures:  None  Antibiotics:  Anti-infectives (From admission, onward)   None     Subjective: Today, no interval complaints reported.  Able to eat food by himself..  Objective: Vitals:   01/12/20 2309 01/13/20 0329  BP: 140/73 135/79  Pulse: 64 64  Resp: 18 17  Temp: 98.3 F (36.8 C) 98 F (36.7 C)  SpO2:      Intake/Output Summary (Last 24 hours) at 01/13/2020 0804 Last data filed at 01/12/2020 1900 Gross per 24 hour  Intake 400 ml  Output 425 ml  Net -25 ml   Filed Weights   01/08/20 1530  Weight: 88.7 kg   Body mass index is 26.52 kg/m.   Physical Exam:  GENERAL: Patient is alert awake and mildly communicative.  Has history of dementia.   HENT: No scleral pallor or icterus. Pupils equally reactive to light. Oral mucosa is moist NECK: is supple, no palpable thyroid enlargement. CHEST: Clear to auscultation Diminished breath sounds bilaterally. CVS: S1 and S2 heard, no murmur. Regular rate and rhythm. No pericardial rub. ABDOMEN: Soft, non-tender, bowel sounds are present. EXTREMITIES: On mittens.  Left great toe arthrosis.  No redness. Left sided weakness more than the right.  CNS: Alert awake, and mildly communicative.  Has baseline dementia.  Has dysarthria. SKIN: warm and dry without rashes.  Data Review: I have personally reviewed the following laboratory data and studies,  CBC: Recent Labs  Lab 01/07/20 1814 01/07/20 1835 01/10/20 0337  WBC 10.4  --  8.0  NEUTROABS 7.8*  --   --   HGB 14.8 14.6 14.2  HCT 45.4 43.0 41.9  MCV 93.0  --  90.3  PLT 151  --  142*   Basic Metabolic Panel: Recent Labs  Lab 01/07/20 1814 01/07/20 1835 01/10/20 0337 01/11/20 0256 01/12/20 0308  NA 137 139 137 137 137   K 3.9 3.9 4.2 4.0 4.0  CL 103  --  104 104 105  CO2 26  --  27 23 23   GLUCOSE 123*  --  111* 118* 143*  BUN 17  --  23 23 25*  CREATININE 1.40*  --  1.39* 1.35* 1.29*  CALCIUM 8.3*  --  8.3* 7.9* 8.4*   Liver Function Tests: Recent Labs  Lab 01/07/20 1814 01/08/20 0540 01/12/20 0308  AST 20  --  24  ALT 24  --  21  ALKPHOS 74  --  76  BILITOT 1.9* 1.8* 0.9  PROT 6.3*  --  6.0*  ALBUMIN 3.2*  --  2.6*   No results for input(s): LIPASE, AMYLASE in the last 168 hours. No results for input(s): AMMONIA in the last 168 hours. Cardiac Enzymes: No results for input(s): CKTOTAL, CKMB, CKMBINDEX, TROPONINI in the last 168 hours. BNP (last 3 results) No results for input(s): BNP in the last 8760 hours.  ProBNP (last 3 results) No results for input(s): PROBNP in the last 8760 hours.  CBG: Recent Labs  Lab  01/07/20 1641 01/08/20 1232 01/08/20 2130  GLUCAP 92 93 104*   Recent Results (from the past 240 hour(s))  SARS CORONAVIRUS 2 (TAT 6-24 HRS) Nasopharyngeal Nasopharyngeal Swab     Status: None   Collection Time: 01/07/20  5:10 PM   Specimen: Nasopharyngeal Swab  Result Value Ref Range Status   SARS Coronavirus 2 NEGATIVE NEGATIVE Final    Comment: (NOTE) SARS-CoV-2 target nucleic acids are NOT DETECTED. The SARS-CoV-2 RNA is generally detectable in upper and lower respiratory specimens during the acute phase of infection. Negative results do not preclude SARS-CoV-2 infection, do not rule out co-infections with other pathogens, and should not be used as the sole basis for treatment or other patient management decisions. Negative results must be combined with clinical observations, patient history, and epidemiological information. The expected result is Negative. Fact Sheet for Patients: HairSlick.no Fact Sheet for Healthcare Providers: quierodirigir.com This test is not yet approved or cleared by the [place]  FDA and  has been authorized for detection and/or diagnosis of SARS-CoV-2 by FDA under an Emergency Use Authorization (EUA). This EUA will remain  in effect (meaning this test can be used) for the duration of the COVID-19 declaration under Section 56 4(b)(1) of the Act, 21 U.S.C. section 360bbb-3(b)(1), unless the authorization is terminated or revoked sooner. Performed at [facility], [address], [place], [place] [address]   Culture, blood (Routine X 2) w Reflex to ID Panel     Status: None   Collection Time: 01/08/20  6:05 PM   Specimen: BLOOD  Result Value Ref Range Status   Specimen Description BLOOD LEFT ANTECUBITAL  Final   Special Requests   Final    BOTTLES DRAWN AEROBIC AND ANAEROBIC Blood Culture adequate volume   Culture   Final    NO GROWTH 5 DAYS Performed at [facility], [address] [place], [place] [address]    Report Status 01/13/2020 FINAL  Final  Culture, blood (Routine X 2) w Reflex to ID Panel     Status: None   Collection Time: 01/08/20  6:15 PM   Specimen: BLOOD RIGHT ARM  Result Value Ref Range Status   Specimen Description BLOOD RIGHT ARM  Final   Special Requests   Final    BOTTLES DRAWN AEROBIC AND ANAEROBIC Blood Culture results may not be optimal due to an inadequate volume of blood received in culture bottles   Culture   Final    NO GROWTH 5 DAYS Performed at [facility], [address], [place], [place] [address]    Report Status 01/13/2020 FINAL  Final  SARS CORONAVIRUS 2 (TAT 6-24 HRS) Nasopharyngeal Nasopharyngeal Swab     Status: None   Collection Time: 01/11/20  2:14 AM   Specimen: Nasopharyngeal Swab  Result Value Ref Range Status   SARS Coronavirus 2 NEGATIVE NEGATIVE Final    Comment: (NOTE) SARS-CoV-2 target nucleic acids are NOT DETECTED. The SARS-CoV-2 RNA is generally detectable in upper and lower respiratory specimens during the acute phase of infection. Negative results do not preclude SARS-CoV-2  infection, do not rule out co-infections with other pathogens, and should not be used as the sole basis for treatment or other patient management decisions. Negative results must be combined with clinical observations, patient history, and epidemiological information. The expected result is Negative. Fact Sheet for Patients: HairSlick.no Fact Sheet for Healthcare Providers: quierodirigir.com This test is not yet approved or cleared by the [place] FDA and  has been authorized for detection and/or diagnosis of SARS-CoV-2 by FDA under an Emergency Use Authorization (EUA). This EUA will remain  in effect (meaning this test can be used) for the duration of the COVID-19 declaration under Section 56 4(b)(1) of the Act, 21 U.S.C. section 360bbb-3(b)(1), unless the authorization is terminated or revoked sooner. Performed at [facility], [address] [place], [place] [address]      Studies: No results found.  Scheduled Meds: . aspirin EC  81 mg Oral Daily  . atorvastatin  40 mg Oral q1800  . clopidogrel  75 mg Oral Daily  . enoxaparin (LOVENOX) injection  40 mg Subcutaneous Q24H  . memantine  10 mg Oral BID  . rivastigmine  4.6 mg Transdermal Daily  . sodium chloride flush  3 mL Intravenous Q12H    Continuous Infusions: . sodium chloride       Joycelyn Das, MD  [facility] 01/13/2020

## 2020-01-14 LAB — SARS CORONAVIRUS 2 (TAT 6-24 HRS): SARS Coronavirus 2: NEGATIVE

## 2020-01-14 MED ORDER — CLOPIDOGREL BISULFATE 75 MG PO TABS: 75.0000 mg | ORAL_TABLET | Freq: Every day | ORAL | 0 refills | Status: DC

## 2020-01-14 MED ORDER — ASPIRIN 81 MG PO TBEC: 81.0000 mg | DELAYED_RELEASE_TABLET | Freq: Every day | ORAL | Status: DC

## 2020-01-14 MED ORDER — SENNOSIDES-DOCUSATE SODIUM 8.6-50 MG PO TABS: 1.0000 | ORAL_TABLET | Freq: Every evening | ORAL | Status: DC | PRN

## 2020-01-14 MED ORDER — ATORVASTATIN CALCIUM 40 MG PO TABS: 40.0000 mg | ORAL_TABLET | Freq: Every day | ORAL | Status: DC

## 2020-01-14 NOTE — TOC Progression Note (Signed)
Transition of Care [facility]) - Progression Note    Patient Details  Name: Kyle [name] MRN: [id number] Date of Birth: 1936-09-22  Transition of Care [facility]) CM/SW Contact  Baldemar Lenis, [place] Phone Number: 01/14/2020, 2:46 PM  Clinical Narrative:   CSW planned on discharging patient to [facility] today, but was alerted that patient's wife, who has been up visiting with the patient, has come back positive for COVID-19. CSW updated [facility] and they will need a new COVID test on the patient to ensure that he doesn't have it. CSW updated MD on barriers to discharge. Patient can transition to [facility] after COVID test results, assuming it is negative. CSW to follow.    Expected Discharge Plan: Long Term Nursing Home Barriers to Discharge: Continued Medical Work up, English as a second language teacher  Expected Discharge Plan and Services Expected Discharge Plan: Long Term Nursing Home     Post Acute Care Choice: Nursing Home Living arrangements for the past 2 months: Single Family Home Expected Discharge Date: 01/14/20                                     Social Determinants of Health (SDOH) Interventions    Readmission Risk Interventions No flowsheet data found.

## 2020-01-14 NOTE — Discharge Summary (Addendum)
Physician Discharge Summary  Kyle [name] EQA:[id number] DOB: 1936/03/21 DOA: 01/07/2020  PCP: Kyle Apley, MD  Admit date: 01/07/2020 Discharge date: 01/14/2020  Admitted From: Home  Discharge disposition: SNF   Recommendations for Outpatient Follow-Up:    Please follow up with your primary care provider at the skilled nursing facility in 3 to 5 days.  Patient will need to follow-up with [facility] neurology as outpatient in 2-3 weeks.  Will need to continue dual antiplatelets for next 16 days then aspirin alone.   Ultrasound showed Right distal (posterior tibial veins) DVT.  Patient will need weekly ultrasound of the lower extremity to see the progress of DVT.  If the DVT progresses or involves proximal vein, will need anticoagulation.   Advance diet as recommended by speech therapy.   Discharge Diagnosis:   Principal Problem:   Stroke [name]) Active Problems:   Alzheimer disease (HCC)   Serum total bilirubin elevated  Discharge Condition: Improved.  Diet recommendation: dysphagia II diet  Wound care: None.  Code status: DNR   History of Present Illness:   Kyle [name] a 84 y.o.malewith medical history significant ofadvanced Alzheimer's dementia presenting to the ED for evaluation of left-sided weakness which started a week ago. No history could be obtained from the patient given his baseline dementia. History was obtained from wife at bedside. Wife stated that for the past 1 week patient has had weakness in his left arm and left leg. When he was trying to walk using a walker but slid down on the floor. He did not hit his head or sustain any injuries from the fall.  ED Course:Temperature 100.1 F. Not tachycardic. Slightly tachypneic. Not hypotensive. Labs showing no leukocytosis. Blood glucose 123. Creatinine 1.4, at baseline. T bili 1.9, remainder of LFTs normal. T bili was normal on labs done a year ago. SARS-CoV-2 PCR test negative. UA not  suggestive of infection. Chest x-ray showed cardiomegaly and no active disease. Abdominal x-ray showed no acute findings. Head CT was negative for acute intracranial hemorrhage but with multifocal hypodensities with loss of gray-white matter discrimination. Brain MRI showed acute right ACA territory infarcts with mild petechial hemorrhage but no malignant hemorrhagic transformation or mass-effect. Chronic left ACA territory infarct. Patient received a 500 cc normal saline bolus. Patient was seen by neurology-outside window for thrombolysis or endovascular thrombectomy due to last known normal over a week ago.  Patient was then admitted to [name].   [name] Course:  Following conditions were addressed during hospitalization as listed below,  Acute ischemic stroke Presented with 1 week history of left-sided weakness. Brain MRI showed acute right ACA territory infarct with mild petechial hemorrhage but no hemorrhagic transformation or mass-effect. Seen by neurology and patient was outside window for thrombolytic treatement.  Patient was then admitted for stroke work-up.  We will continue dual antiplatelets for 3 weeks then aspirin alone.  MR angiogram of the head showed occlusion of the right A2 segment corresponding to right anterior cerebral artery infarct.  2D echocardiogram performed on 01/08/2020 showed LV ejection fraction of 55 to 60%.  Carotid duplex ultrasound without significant stenosis and lower extremity vascular duplex  Ultrasound showed Right distal (posterior tibial veins) DVT, will need serial ultrasound as outpatient..  Lipid panel reviewed with HDL of 34.  LDL 119.  Hemoglobin A1c of 5.5. Speech therapy  recommended dysphagia 2 diet. PT, OT recommend SNF. Status post bubble study due to DVT with no PFO. No plans for anticoagulation due to distal DVT and  no PFO.   Dysphagia secondary to stroke.  Neurology on board. On Dual antiplatelet therapy.  On dysphagia 2 diet.  Advance  diet as recommended by speech therapy.  Mild grade fever Resolved.  No leukocytosis.  Chest x-ray without any evidence of pneumonia.  Covid test was negative.  T-max of 98.3 at this time.  Urinalysis was negative.  Negative blood cultures, no antibiotics were administered.  Elevated T bilirubin Improved.  On Presentation.  Repeat CMP from 1/17 showed normal LFTs including bilirubin.  Advanced Alzheimer's dementia Continue Namenda and Exelon patch.  Continue supportive care.  Left foot redness and swelling.  Improved. X-ray of the left foot showed arthrosis of the first metatarsophalangeal joint with soft tissue swelling.  Negative blood cultures.  Right lower extremity tibial  DVT.   Transcranial Doppler with bubble study was negative so no anticoagulation plan since there is no PFO.Will need serial ultrasound as outpatient.  Currently on dual antiplatelets.  Disposition.  At this time, patient is stable for disposition to skilled nursing facility.  Medical Consultants:    neurology  Procedures:    None Subjective:   Today, patient denies interval complaints.  No complaints reported by the nursing staff.  Discharge Exam:   Vitals:   01/14/20 0405 01/14/20 0958  BP: (!) 152/79 131/80  Pulse: 74 68  Resp: 18 18  Temp: 97.7 F (36.5 C) 98 F (36.7 C)  SpO2: 93% 95%   Vitals:   01/13/20 1948 01/14/20 0020 01/14/20 0405 01/14/20 0958  BP: 140/70 (!) 148/90 (!) 152/79 131/80  Pulse: 63 76 74 68  Resp: 18 18 18 18   Temp: 97.7 F (36.5 C) 98.1 F (36.7 C) 97.7 F (36.5 C) 98 F (36.7 C)  TempSrc: Oral Oral Oral Oral  SpO2: 94% 94% 93% 95%  Weight:      Height:       General: Alert awake, not much verbal, not in obvious distress HENT: pupils equally reacting to light and accommodation.  No scleral pallor or icterus noted. Oral mucosa is moist.  Chest:  Clear breath sounds.  Diminished breath sounds bilaterally. No crackles or wheezes.  CVS: S1 &S2 heard. No  murmur.  Regular rate and rhythm. Abdomen: Soft, nontender, nondistended.  Bowel sounds are heard.   Extremities: No cyanosis, clubbing or edema.  Peripheral pulses are palpable. Psych: Alert, awake, has a underlying dementia. CNS:  No cranial nerve deficits.  Power equal in all extremities.  Moving all extremities. Skin: Warm and dry.  No rashes noted.  The results of significant diagnostics from this hospitalization (including imaging, microbiology, ancillary and laboratory) are listed below for reference.     Diagnostic Studies:   DG Chest 1 View  Result Date: 01/07/2020 CLINICAL DATA:  Left side weakness EXAM: CHEST  1 VIEW COMPARISON:  10/08/2009 FINDINGS: Cardiomegaly. No confluent opacities, effusions or edema. No acute bony abnormality. IMPRESSION: Cardiomegaly.  No active disease. Electronically Signed   By: Charlett Nose M.D.   On: 01/07/2020 17:50   DG Abdomen 1 View  Result Date: 01/07/2020 CLINICAL DATA:  Cough EXAM: ABDOMEN - 1 VIEW COMPARISON:  07/15/2014 FINDINGS: Prior cholecystectomy. Nonobstructive bowel gas pattern. No organomegaly or free air. IMPRESSION: No acute findings. Electronically Signed   By: Charlett Nose M.D.   On: 01/07/2020 17:50   CT Head Wo Contrast  Result Date: 01/07/2020 CLINICAL DATA:  84 year old male with history of Alzheimer's and multiple falls. EXAM: CT HEAD WITHOUT CONTRAST TECHNIQUE: Contiguous axial images were  obtained from the base of the skull through the vertex without intravenous contrast. COMPARISON:  None. FINDINGS: Brain: There is moderate age-related atrophy and chronic microvascular ischemic changes. Bilateral frontal hypodensities with loss of gray-white matter discrimination (series 2, image 23 and 25), likely chronic. There is no acute intracranial hemorrhage. No mass effect or midline shift. No extra-axial fluid collection. Vascular: No hyperdense vessel or unexpected calcification. Skull: Normal. Negative for fracture or focal lesion.  Sinuses/Orbits: Mild mucoperiosteal thickening of paranasal sinuses. No air-fluid level. The mastoid air cells are clear. Other: None IMPRESSION: 1. No acute intracranial hemorrhage. 2. Moderate age-related atrophy and chronic microvascular ischemic changes. Multifocal hypodensities with loss of gray-white matter discrimination, likely chronic. If there is high clinical concern for acute infarct further evaluation with MRI is recommended. Electronically Signed   By: Elgie Collard M.D.   On: 01/07/2020 17:42   MR ANGIO HEAD WO CONTRAST  Result Date: 01/08/2020 CLINICAL DATA:  Stroke.  Left-sided weakness EXAM: MRA HEAD WITHOUT CONTRAST TECHNIQUE: Angiographic images of the Circle of Willis were obtained using MRA technique without intravenous contrast. COMPARISON:  MRI head 01/07/2000 FINDINGS: Severe stenosis distal left vertebral artery V4 segment. Right vertebral artery patent. PICA patent bilaterally with the origins not imaged. Mild atherosclerotic disease in the basilar without stenosis. Moderate stenosis right P2. Mild stenosis left P2. Cavernous carotid widely patent bilaterally.  Negative for aneurysm. Occlusion of the right A2 segment corresponding to right anterior cerebral artery infarct. Right middle cerebral artery patent. Multiple areas of irregularity and stenosis right M2 and M3 branches. Left anterior cerebral artery patent with mild atherosclerotic disease. Left middle cerebral artery patent with multiple areas of atherosclerotic irregularity and stenosis left M2 and M3 branches. IMPRESSION: 1. Occlusion of the right A2 segment corresponding to right anterior cerebral artery infarct. 2. Moderate to advanced intracranial atherosclerotic disease as above. Electronically Signed   By: Marlan Palau M.D.   On: 01/08/2020 08:33   MR BRAIN WO CONTRAST  Addendum Date: 01/07/2020   ADDENDUM REPORT: 01/07/2020 23:10 ADDENDUM: Study discussed by telephone with Dr. Mancel Bale on 01/07/2020 at  23:10 . Electronically Signed   By: Odessa Fleming M.D.   On: 01/07/2020 23:10   Result Date: 01/07/2020 CLINICAL DATA:  84 year old male with dimension multiple falls. Left side weakness for 1 week. EXAM: MRI HEAD WITHOUT CONTRAST TECHNIQUE: Multiplanar, multiecho pulse sequences of the brain and surrounding structures were obtained without intravenous contrast. COMPARISON:  Head CT earlier today. Brain MRI 01/31/2005. FINDINGS: Brain: Widely scattered and multifocal confluent areas of restricted diffusion in the right ACA territory affecting the right cingulate and superior frontal gyrus plus some of the body of the corpus callosum (series 4, image 23). Associated cytotoxic edema with T2 and FLAIR hyperintensity. There is petechial hemorrhage in the right superior frontal gyrus on SWI series 6, image 87. No associated mass effect. Contralateral chronic encephalomalacia in the left ACA territory. No other restricted diffusion. Substantially decreased cerebral volume since 2006 and widespread, confluent bilateral cerebral white matter T2 and FLAIR hyperintensity now. No midline shift, mass effect, evidence of mass lesion, ventriculomegaly, extra-axial collection. Cervicomedullary junction and pituitary are within normal limits. No other cortical encephalomalacia identified. The deep gray matter nuclei also seem relatively spared, there are some small chronic lacunar infarcts of the anterior right basal ganglia. Brainstem and cerebellum appear within normal limits for age. Vascular: Major intracranial vascular flow voids are stable since 2006. Skull and upper cervical spine: Negative visible cervical spine. Visualized bone  marrow signal is within normal limits. Sinuses/Orbits: Postoperative changes to both globes otherwise negative orbits. Stable paranasal sinus and mastoid pneumatization. Other: Visible internal auditory structures appear negative. Scalp and face soft tissues appear negative. IMPRESSION: 1. Confluent  Acute Right ACA territory infarcts with mild petechial hemorrhage but no malignant hemorrhagic transformation or mass effect. 2. Chronic left ACA territory infarct. 3. Substantially decreased cerebral volume since a 2006 MRI with advanced but nonspecific cerebral white matter signal changes since that time. Electronically Signed: By: Odessa Fleming M.D. On: 01/07/2020 23:05   DG Foot 2 Views Left  Result Date: 01/08/2020 CLINICAL DATA:  Localized swelling and redness of the left foot new onset left-sided weakness and paralysis. EXAM: LEFT FOOT - 2 VIEW COMPARISON:  None. FINDINGS: No acute fracture or traumatic malalignment is evident. There is focal severe arthrosis at the first metatarsophalangeal joint with extensive sclerotic features and bony remodeling. Periarticular osteophyte formations are noted as well as mild soft tissue swelling. Additional degenerative changes throughout the left foot are more mild. Questionable lucency along the anterolateral corner of the cuboid could reflect a small marginal erosion. 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 Recommend correlation with mobility to assess for hallux limitus. Questionable erosion along the anterolateral corner of the cuboid, could reflect some underlying arthropathy. Correlation with exam findings and serologies could be considered. Electronically Signed   By: Kreg Shropshire M.D.   On: 01/08/2020 19:01   ECHOCARDIOGRAM COMPLETE  Result Date: 01/08/2020   ECHOCARDIOGRAM REPORT   Patient Name:   Kyle [name] Date of Exam: 01/08/2020 Medical Rec #:  [id number]       Height:       72.0 in Accession #:    [id number]      Weight:       197.3 lb Date of Birth:  1936/05/16        BSA:          2.12 m Patient Age:    84 years        BP:            148/71 mmHg Patient Gender: M               HR:           70 bpm. Exam Location:  Inpatient Procedure: 2D Echo, Cardiac Doppler and Color Doppler Indications:    Stroke 434.91  History:        Patient has no prior history of Echocardiogram examinations.                 Stroke; Risk Factors:Non-Smoker.  Sonographer:    Tonia Ghent RDCS Referring Phys: [id number] VASUNDHRA RATHORE IMPRESSIONS  1. Left ventricular ejection fraction, by visual estimation, is 55 to 60%. The left ventricle has normal function. There is no left ventricular hypertrophy.  2. The left ventricle has no regional wall motion abnormalities.  3. Global right ventricle has normal systolic function.The right ventricular size is normal. No increase in right ventricular wall thickness.  4. Left atrial size was normal.  5. Right atrial size was normal.  6. The mitral valve is normal in structure. No evidence of mitral valve regurgitation. No evidence of mitral stenosis.  7. The tricuspid valve is normal in structure.  8. The aortic valve is normal  in structure. Aortic valve regurgitation is mild. No evidence of aortic valve sclerosis or stenosis.  9. The pulmonic valve was normal in structure. Pulmonic valve regurgitation is not visualized. 10. Mildly elevated pulmonary artery systolic pressure. 11. The tricuspid regurgitant velocity is 2.80 m/s, and with an assumed right atrial pressure of 3 mmHg, the estimated right ventricular systolic pressure is mildly elevated at 34.4 mmHg. 12. The inferior vena cava is normal in size with greater than 50% respiratory variability, suggesting right atrial pressure of 3 mmHg. FINDINGS  Left Ventricle: Left ventricular ejection fraction, by visual estimation, is 55 to 60%. The left ventricle has normal function. The left ventricle has no regional wall motion abnormalities. The left ventricular internal cavity size was the left ventricle is normal in size. There is no left ventricular hypertrophy. Left ventricular  diastolic parameters were normal. Normal left atrial pressure. Right Ventricle: The right ventricular size is normal. No increase in right ventricular wall thickness. Global RV systolic function is has normal systolic function. The tricuspid regurgitant velocity is 2.80 m/s, and with an assumed right atrial pressure  of 3 mmHg, the estimated right ventricular systolic pressure is mildly elevated at 34.4 mmHg. Left Atrium: Left atrial size was normal in size. Right Atrium: Right atrial size was normal in size Pericardium: There is no evidence of pericardial effusion. Mitral Valve: The mitral valve is normal in structure. No evidence of mitral valve regurgitation. No evidence of mitral valve stenosis by observation. Tricuspid Valve: The tricuspid valve is normal in structure. Tricuspid valve regurgitation is trivial. Aortic Valve: The aortic valve is normal in structure. Aortic valve regurgitation is mild. The aortic valve is structurally normal, with no evidence of sclerosis or stenosis. Pulmonic Valve: The pulmonic valve was normal in structure. Pulmonic valve regurgitation is not visualized. Pulmonic regurgitation is not visualized. Aorta: The aortic root, ascending aorta and aortic arch are all structurally normal, with no evidence of dilitation or obstruction. Venous: The inferior vena cava is normal in size with greater than 50% respiratory variability, suggesting right atrial pressure of 3 mmHg. IAS/Shunts: No atrial level shunt detected by color flow Doppler. There is no evidence of a patent foramen ovale. No ventricular septal defect is seen or detected. There is no evidence of an atrial septal defect.  LEFT VENTRICLE PLAX 2D LVIDd:         4.70 cm       Diastology LVIDs:         3.39 cm       LV e' lateral:   9.79 cm/s LV PW:         0.83 cm       LV E/e' lateral: 7.3 LV IVS:        0.81 cm       LV e' medial:    6.20 cm/s LVOT diam:     1.80 cm       LV E/e' medial:  11.5 LV SV:         55 ml LV SV Index:    25.76 LVOT Area:     2.54 cm  LV Volumes (MOD) LV area d, A2C:    34.30 cm LV area d, A4C:    33.90 cm LV area s, A2C:    19.00 cm LV area s, A4C:    22.80 cm LV major d, A2C:   7.88 cm LV major d, A4C:   7.50 cm LV major s, A2C:   6.22 cm LV major s, A4C:  6.54 cm LV vol d, MOD A2C: 126.0 ml LV vol d, MOD A4C: 128.0 ml LV vol s, MOD A2C: 51.5 ml LV vol s, MOD A4C: 69.3 ml LV SV MOD A2C:     74.5 ml LV SV MOD A4C:     128.0 ml LV SV MOD BP:      68.7 ml RIGHT VENTRICLE RV S prime:     16.50 cm/s TAPSE (M-mode): 1.9 cm LEFT ATRIUM             Index       RIGHT ATRIUM           Index LA diam:        3.00 cm 1.42 cm/m  RA Area:     15.30 cm LA Vol (A2C):   42.6 ml 20.11 ml/m RA Volume:   37.70 ml  17.80 ml/m LA Vol (A4C):   40.3 ml 19.02 ml/m LA Biplane Vol: 43.4 ml 20.49 ml/m  AORTIC VALVE LVOT Vmax:   120.00 cm/s LVOT Vmean:  75.300 cm/s LVOT VTI:    0.246 m  AORTA Ao Root diam: 3.20 cm MITRAL VALVE                        TRICUSPID VALVE MV Area (PHT): 2.99 cm             TR Peak grad:   31.4 mmHg MV PHT:        73.66 msec           TR Vmax:        280.00 cm/s MV Decel Time: 254 msec MV E velocity: 71.60 cm/s 103 cm/s  SHUNTS MV A velocity: 80.60 cm/s 70.3 cm/s Systemic VTI:  0.25 m MV E/A ratio:  0.89       1.5       Systemic Diam: 1.80 cm  Mihai Croitoru MD Electronically signed by Thurmon Fair MD Signature Date/Time: 01/08/2020/4:58:36 PM    Final      Labs:   Basic Metabolic Panel: Recent Labs  Lab 01/07/20 1814 01/07/20 1814 01/07/20 1835 01/07/20 1835 01/10/20 0337 01/10/20 0337 01/11/20 0256 01/12/20 0308  NA 137  --  139  --  137  --  137 137  K 3.9   < > 3.9   < > 4.2   < > 4.0 4.0  CL 103  --   --   --  104  --  104 105  CO2 26  --   --   --  27  --  23 23  GLUCOSE 123*  --   --   --  111*  --  118* 143*  BUN 17  --   --   --  23  --  23 25*  CREATININE 1.40*  --   --   --  1.39*  --  1.35* 1.29*  CALCIUM 8.3*  --   --   --  8.3*  --  7.9* 8.4*   < > = values in this  interval not displayed.   GFR Estimated Creatinine Clearance: 48.5 mL/min (A) (by C-G formula based on SCr of 1.29 mg/dL (H)). Liver Function Tests: Recent Labs  Lab 01/07/20 1814 01/08/20 0540 01/12/20 0308  AST 20  --  24  ALT 24  --  21  ALKPHOS 74  --  76  BILITOT 1.9* 1.8* 0.9  PROT 6.3*  --  6.0*  ALBUMIN 3.2*  --  2.6*   No results for input(s): LIPASE, AMYLASE in the last 168 hours. No results for input(s): AMMONIA in the last 168 hours. Coagulation profile No results for input(s): INR, PROTIME in the last 168 hours.  CBC: Recent Labs  Lab 01/07/20 1814 01/07/20 1835 01/10/20 0337  WBC 10.4  --  8.0  NEUTROABS 7.8*  --   --   HGB 14.8 14.6 14.2  HCT 45.4 43.0 41.9  MCV 93.0  --  90.3  PLT 151  --  142*   Cardiac Enzymes: No results for input(s): CKTOTAL, CKMB, CKMBINDEX, TROPONINI in the last 168 hours. BNP: Invalid input(s): POCBNP CBG: Recent Labs  Lab 01/07/20 1641 01/08/20 1232 01/08/20 2130  GLUCAP 92 93 104*   D-Dimer No results for input(s): DDIMER in the last 72 hours. Hgb A1c No results for input(s): HGBA1C in the last 72 hours. Lipid Profile No results for input(s): CHOL, HDL, LDLCALC, TRIG, CHOLHDL, LDLDIRECT in the last 72 hours. Thyroid function studies No results for input(s): TSH, T4TOTAL, T3FREE, THYROIDAB in the last 72 hours.  Invalid input(s): FREET3 Anemia work up No results for input(s): VITAMINB12, FOLATE, FERRITIN, TIBC, IRON, RETICCTPCT in the last 72 hours. Microbiology Recent Results (from the past 240 hour(s))  SARS CORONAVIRUS 2 (TAT 6-24 HRS) Nasopharyngeal Nasopharyngeal Swab     Status: None   Collection Time: 01/07/20  5:10 PM   Specimen: Nasopharyngeal Swab  Result Value Ref Range Status   SARS Coronavirus 2 NEGATIVE NEGATIVE Final    Comment: (NOTE) SARS-CoV-2 target nucleic acids are NOT DETECTED. The SARS-CoV-2 RNA is generally detectable in upper and lower respiratory specimens during the acute phase of  infection. Negative results do not preclude SARS-CoV-2 infection, do not rule out co-infections with other pathogens, and should not be used as the sole basis for treatment or other patient management decisions. Negative results must be combined with clinical observations, patient history, and epidemiological information. The expected result is Negative. Fact Sheet for Patients: SugarRoll.be Fact Sheet for Healthcare Providers: https://www.woods-mathews.com/ This test is not yet approved or cleared by the [place] FDA and  has been authorized for detection and/or diagnosis of SARS-CoV-2 by FDA under an Emergency Use Authorization (EUA). This EUA will remain  in effect (meaning this test can be used) for the duration of the COVID-19 declaration under Section 56 4(b)(1) of the Act, 21 U.S.C. section 360bbb-3(b)(1), unless the authorization is terminated or revoked sooner. Performed at [name], [place] [address] [place], [place] [address]   Culture, blood (Routine X 2) w Reflex to ID Panel     Status: None   Collection Time: 01/08/20  6:05 PM   Specimen: BLOOD  Result Value Ref Range Status   Specimen Description BLOOD LEFT ANTECUBITAL  Final   Special Requests   Final    BOTTLES DRAWN AEROBIC AND ANAEROBIC Blood Culture adequate volume   Culture   Final    NO GROWTH 5 DAYS Performed at [name], [place] [address] [place], [place] [address]    Report Status 01/13/2020 FINAL  Final  Culture, blood (Routine X 2) w Reflex to ID Panel     Status: None   Collection Time: 01/08/20  6:15 PM   Specimen: BLOOD RIGHT ARM  Result Value Ref Range Status   Specimen Description BLOOD RIGHT ARM  Final   Special Requests   Final    BOTTLES DRAWN AEROBIC AND ANAEROBIC Blood Culture results may not be optimal due  to an inadequate volume of blood received in culture bottles   Culture   Final    NO GROWTH 5 DAYS Performed at [name], [address] [place], [place] [address]    Report Status 01/13/2020 FINAL  Final  SARS CORONAVIRUS 2 (TAT 6-24 HRS) Nasopharyngeal Nasopharyngeal Swab     Status: None   Collection Time: 01/11/20  2:14 AM   Specimen: Nasopharyngeal Swab  Result Value Ref Range Status   SARS Coronavirus 2 NEGATIVE NEGATIVE Final    Comment: (NOTE) SARS-CoV-2 target nucleic acids are NOT DETECTED. The SARS-CoV-2 RNA is generally detectable in upper and lower respiratory specimens during the acute phase of infection. Negative results do not preclude SARS-CoV-2 infection, do not rule out co-infections with other pathogens, and should not be used as the sole basis for treatment or other patient management decisions. Negative results must be combined with clinical observations, patient history, and epidemiological information. The expected result is Negative. Fact Sheet for Patients: HairSlick.nohttps://www.fda.gov/media/138098/download Fact Sheet for Healthcare Providers: quierodirigir.comhttps://www.fda.gov/media/138095/download This test is not yet approved or cleared by the [place] FDA and  has been authorized for detection and/or diagnosis of SARS-CoV-2 by FDA under an Emergency Use Authorization (EUA). This EUA will remain  in effect (meaning this test can be used) for the duration of the COVID-19 declaration under Section 56 4(b)(1) of the Act, 21 U.S.C. section 360bbb-3(b)(1), unless the authorization is terminated or revoked sooner. Performed at [facility], [address] [place], [place] [address]      Discharge Instructions:   Discharge Instructions    Diet - low sodium heart healthy   Complete by: As directed    Dysphagia I diet, advance as tolerated as per speech therapy   Discharge instructions   Complete by: As directed    Follow-up with your primary care provider at the skilled nursing facility in 3 to 5 days.  [name] as has been scheduled.   Increase  activity slowly   Complete by: As directed    As per PT     Allergies as of 01/14/2020      Reactions   Demerol [meperidine] Nausea And Vomiting      Medication List    TAKE these medications   aspirin 81 MG EC tablet Take 1 tablet (81 mg total) by mouth daily. Start taking on: January 15, 2020   atorvastatin 40 MG tablet Commonly known as: LIPITOR Take 1 tablet (40 mg total) by mouth daily at 6 PM.   clopidogrel 75 MG tablet Commonly known as: PLAVIX Take 1 tablet (75 mg total) by mouth daily for 16 days. Start taking on: January 15, 2020   memantine 10 MG tablet Commonly known as: NAMENDA Take 10 mg by mouth 2 (two) times daily.   rivastigmine 4.6 mg/24hr Commonly known as: Exelon Place 1 patch (4.6 mg total) onto the skin daily.   senna-docusate 8.6-50 MG tablet Commonly known as: Senokot-S Take 1 tablet by mouth at bedtime as needed for mild constipation.       Contact information for follow-up providers    Glean SalvoSlack, Sarah J, NP. Go on 03/24/2020.   Specialty: Neurology Contact information: [address] [place] [address] [phone]            Contact information for after-discharge care    Destination    [facility] .   Service: Skilled Nursing Contact information: [address]  [place] [address] [phone]                  Time coordinating discharge: 39 minutes  Signed:  Valentine Barney  [facility] 01/14/2020, 11:30 AM

## 2020-01-15 NOTE — Plan of Care (Signed)

## 2020-01-15 NOTE — TOC Transition Note (Signed)
Transition of Care [facility]) - CM/SW Discharge Note   Patient Details  Name: Kyle [name] MRN: [id number] Date of Birth: 12-Nov-1936  Transition of [facility]) CM/SW Contact:  Terrilee Croak, Student-Social Work Phone Number: 01/15/2020, 1:06 PM   Clinical Narrative:    Nurse to call report to [phone] ask for 300 hall. Room # 320.   Final next level of care: Skilled Nursing Facility Barriers to Discharge: Barriers Resolved   Patient Goals and CMS Choice Patient states their goals for this hospitalization and ongoing recovery are:: patient unable to state goals due to advanced dementia CMS Medicare.gov Compare Post Acute Care list provided to:: Patient Represenative (must comment) Choice offered to / list presented to : Spouse  Discharge Placement              Patient chooses bed at: [facility] Patient to be transferred to facility by: PTAR Name of family member notified: Toney Sang [phone] Patient and family notified of of transfer: 01/15/20  Discharge Plan and Services     Post Acute Care Choice: Nursing Home                               Social Determinants of Health (SDOH) Interventions     Readmission Risk Interventions No flowsheet data found.

## 2020-01-15 NOTE — Progress Notes (Signed)
No change in the clinical condition of the patient. Reviewed vitals and examined the patient.  No change in plan for discharge. Stable for discharge today.

## 2020-01-15 NOTE — Progress Notes (Signed)
This Clinical research associate called [facility] to give report on this patient. Report received by [facility], LPN on 142 hall. All questions were answered. Patient IV was removed without complication.Discharge packet at the nurses station.  Awaiting PTAR at this time.

## 2020-01-15 NOTE — Progress Notes (Signed)
  Speech Language Pathology Treatment: Dysphagia  Patient Details Name: Kyle [name] MRN: [id number] DOB: 1936-05-18 Today's Date: 01/15/2020 Time: 0110-0349 SLP Time Calculation (min) (ACUTE ONLY): 16 min  Assessment / Plan / Recommendation Clinical Impression  Patient seen at bedside for dysphagia treatment. Patient alert, HOB raised so patient in upright position. Pt seen with breakfast meal: dysphagia 2/thin liquids tray. SLP provided feeding assistance to patient. Pt seen with dysphagia 2 eggs and sausage as well as pureed grits. Pt with adequate oral acceptance, demonstrated "munch chew" mastication, mild difficulty with bolus cohesion and prolonged mastication prior to the swallow. Minimal oral residue after the swallow, this cleared with liquid rinse (thin liquid, milk via cup and straw sips). Patient with no overt s/s aspiration seen with pureed grits, chopped eggs or sausage seen during session today. Pt edu re: taking small bites sips/alternating liquids and solids. Pt mental status limits understanding. Recommend that patient continue dysphagia 2/thin liquids diet with supervision. Based on clinical presentation, this is the safest diet at this time.  Patient appropriate for discharge from skilled ST at the acute level. Patient may benefit from ST follow-up at the next venue of care.   HPI HPI: 84yo male admitted 01/07/20 with left side weakness x1 week. PMH: advanced dementia.  MRI = [facility] Acute Right ACA territory infarcts with mild petechial hemorrhage but no malignant hemorrhagic transformation or mass effect. Chronic left ACA territory infarct.      SLP Plan    Dysphagia goals met.       Recommendations  Diet recommendations: Dysphagia 2 (fine chop);Thin liquid Liquids provided via: Cup;Straw Medication Administration: Crushed with puree Supervision: Full supervision/cueing for compensatory strategies Compensations: Minimize environmental distractions;Slow rate;Small  sips/bites Postural Changes and/or Swallow Maneuvers: Seated upright 90 degrees                Oral Care Recommendations: Oral care BID Follow up Recommendations: Skilled Nursing facility;24 hour supervision/assistance SLP Visit Diagnosis: Dysphagia, unspecified (R13.10)       Martinez, M.Ed., [facility] Acute Rehabilitation [phone]: Acute Rehab office [phone] - pager    Caysen Whang 01/15/2020, 11:54 AM

## 2020-01-15 NOTE — Progress Notes (Addendum)
Physical Therapy Treatment Patient Details Name: Kyle [name] MRN: [id number] DOB: [birth year] Today's Date: 01/15/2020    History of Present Illness Kyle [name] is a 84 y.o. male past medical history of advanced dementia. Presented to the hospital for the frequent falls and left leg paralysis and worsening of the left leg weakness and 7-8 days of decreased verbal output. 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    PT Comments    Pt performed transfer to edge of bed and x2 standing trials to achieve standing.  He is able to clear his hips to achieve standing but continues to present with flexed hips and upper trunk.  Pt remains to benefit from SNF placement to improve strength and function before returning home.    Follow Up Recommendations  SNF;Supervision/Assistance - 24 hour     Equipment Recommendations  Hospital bed;Other (comment)(hoyer lift)    Recommendations for Other Services       Precautions / Restrictions Precautions Precautions: Fall;Other (comment) Precaution Comments: L hemiplegia, inattention Restrictions Weight Bearing Restrictions: No    Mobility  Bed Mobility Overal bed mobility: Needs Assistance Bed Mobility: Supine to Sit;Sit to Supine     Supine to sit: Max assist Sit to supine: Total assist   General bed mobility comments: Pt able to follow commands to advance RLE to edge of bed, HOB elevated and max assistance to move LLE and elevate trunk into a seated position.  Transfers Overall transfer level: Needs assistance Equipment used: Ambulation equipment used(sara stedy) Transfers: Sit to/from Stand Sit to Stand: Max assist         General transfer comment: PTA performed face toi face assist to facilitate forward weight shifting.  Once in standing he continues to lack hip extension but able to achieve hip clearance from bed and maintain standing x 30 sec.  Performed x2  trials.  Ambulation/Gait                 Stairs             Wheelchair Mobility    Modified Rankin (Stroke Patients Only)       Balance Overall balance assessment: Needs assistance   Sitting balance-Leahy Scale: Poor Sitting balance - Comments: min to mod for balance with B UE support able to maintain unassisted for 2-3 seconds.                                    Cognition Arousal/Alertness: Awake/alert Behavior During Therapy: WFL for tasks assessed/performed Overall Cognitive Status: History of cognitive impairments - at baseline                                 General Comments: Following <25% of commands with max multimodal cueing      Exercises      General Comments        Pertinent Vitals/Pain Pain Assessment: Faces Faces Pain Scale: No hurt    Home Living                      Prior Function            PT Goals (current goals can now be found in the care plan section) Acute Rehab PT Goals Patient Stated Goal: pt wife would like him to go to [facility]  House Potential to Achieve Goals: Fair Progress towards PT goals: Progressing toward goals    Frequency    Min 3X/week      PT Plan Current plan remains appropriate    Co-evaluation              AM-PAC PT "6 Clicks" Mobility   Outcome Measure  Help needed turning from your back to your side while in a flat bed without using bedrails?: Total Help needed moving from lying on your back to sitting on the side of a flat bed without using bedrails?: Total Help needed moving to and from a bed to a chair (including a wheelchair)?: Total Help needed standing up from a chair using your arms (e.g., wheelchair or bedside chair)?: Total Help needed to walk in hospital room?: Total Help needed climbing 3-5 steps with a railing? : Total 6 Click Score: 6    End of Session Equipment Utilized During Treatment: Gait belt Activity Tolerance: Patient  tolerated treatment well Patient left: in bed;with call bell/phone within reach;with bed alarm set;with family/visitor present Nurse Communication: Mobility status PT Visit Diagnosis: Other abnormalities of gait and mobility (R26.89);Hemiplegia and hemiparesis;Other symptoms and signs involving the nervous system (R29.898) Hemiplegia - Right/Left: Left Hemiplegia - dominant/non-dominant: Non-dominant Hemiplegia - caused by: Cerebral infarction     Time: 0626-9485 PT Time Calculation (min) (ACUTE ONLY): 28 min  Charges:  $Therapeutic Activity: 23-37 mins                     Bonney Leitz , PTA Acute Rehabilitation Services Pager [phone] Office [phone]     Theoren Palka Artis Delay 01/15/2020, 11:34 AM

## 2020-01-16 ENCOUNTER — Non-Acute Institutional Stay (SKILLED_NURSING_FACILITY): Payer: Medicare Other | Admitting: Adult Health

## 2020-01-16 ENCOUNTER — Encounter: Payer: Self-pay | Admitting: Adult Health

## 2020-01-16 DIAGNOSIS — I6930 Unspecified sequelae of cerebral infarction: Secondary | ICD-10-CM | POA: Diagnosis not present

## 2020-01-16 DIAGNOSIS — Z86718 Personal history of other venous thrombosis and embolism: Secondary | ICD-10-CM | POA: Insufficient documentation

## 2020-01-16 DIAGNOSIS — G301 Alzheimer's disease with late onset: Secondary | ICD-10-CM

## 2020-01-16 DIAGNOSIS — I824Z9 Acute embolism and thrombosis of unspecified deep veins of unspecified distal lower extremity: Secondary | ICD-10-CM | POA: Insufficient documentation

## 2020-01-16 DIAGNOSIS — R1319 Other dysphagia: Secondary | ICD-10-CM

## 2020-01-16 DIAGNOSIS — R1310 Dysphagia, unspecified: Secondary | ICD-10-CM | POA: Insufficient documentation

## 2020-01-16 DIAGNOSIS — F0280 Dementia in other diseases classified elsewhere without behavioral disturbance: Secondary | ICD-10-CM

## 2020-01-16 DIAGNOSIS — I639 Cerebral infarction, unspecified: Secondary | ICD-10-CM | POA: Insufficient documentation

## 2020-01-16 NOTE — Progress Notes (Signed)
Location:  [facility] Room Number: 320/A Place of Service:  SNF (31) Provider:  Kenard Gower, DNP, FNP-BC  Patient Care Team: Burton Apley, MD as PCP - General (Internal Medicine)  Extended Emergency Contact Information Primary Emergency Contact: Royden, Bulman Address: [address]          [place] [address] [place] Home Phone: [phone] Mobile Phone: [phone] Relation: Spouse Secondary Emergency Contact: Chriss, Mannan Mobile Phone: [phone] Relation: Son  Code Status:  DNR  Goals of care: Advanced Directive information Advanced Directives 01/16/2020  Does Patient Have a Medical Advance Directive? Yes  Type of Estate agent of [place];Out of facility DNR (pink MOST or yellow form)  Does patient want to make changes to medical advance directive? No - Patient declined  Copy of Healthcare Power of Attorney in Chart? Yes - validated most recent copy scanned in chart (See row information)  Pre-existing out of facility DNR order (yellow form or pink MOST form) Yellow form placed in chart (order not valid for inpatient use)     Chief Complaint  Patient presents with  . Acute Visit    Hospitalization Follow Up    HPI:  Pt is a 84 y.o. male who was admitted to [facility] on 01/15/20 post hospitalization 01/07/20 to 01/14/20. He has PMH of advanced Alzheimer's dementia. He has been having left-sided weakness, left arm and left leg, a week ago prior to presenting to the hospital. When he was trying to walk using a walker, he slid down with no sustained injury and did not hit his head. Head CT was negative for acute intracranial hemorrhage but multifocal hypodensities with loss of gray-white matter discrimination.  Brain MRI showed acute right ACA territory infarcts with mild petechial hemorrhage but no malignant hemorrhagic transformation or mass-effect.  Chronic left ACA  territory infarct.  He was given 500 cc normal saline bolus.  Neurology was consulted but he was seen outside the window period  for thrombolysis or endovascular thrombectomy.  He was started on dual antiplatelets for 3 weeks then aspirin alone.  MR angiogram of the head showed occlusion of the right A2 segment corresponding to right anterior cerebral artery infarct.  2D echocardiogram performed on 01/08/2020 showed LV ejection fraction of 55 to 60%.  Carotid Doppler ultrasound without significant stenosis and lower extremity vascular duplex ultrasound showed right distal (posterior tibial veins) DVT.  He will need serial ultrasounds as outpatient.  Lipid panel with HDL of 34, LDL 119, hemoglobin A1c of 5.5.  There was no leukocytosis, blood glucose 123, creatinine 1.4, at baseline, T bilirubin 1.9 and SARS-CoV-2 PCR test negative.  UA was not suggestive of infection.  Chest x-ray showed cardiomegaly and no active disease.  Abdominal x-ray showed no acute findings.  He was seen in his room today. He does not know what is the month nor where he is. He scored 0/15 on his BIMS done today. He has severe left neglect.   Past Medical History:  Diagnosis Date  . Alzheimer disease (HCC) 09/25/2019   Past Surgical History:  Procedure Laterality Date  . APPENDECTOMY    . BACK SURGERY    . CHOLECYSTECTOMY    . HERNIA REPAIR      Allergies  Allergen Reactions  . Demerol [Meperidine] Nausea And Vomiting    Outpatient Encounter Medications as of 01/16/2020  Medication Sig  . aspirin EC 81 MG EC tablet Take 1 tablet (81 mg total) by  mouth daily.  Marland Kitchen atorvastatin (LIPITOR) 40 MG tablet Take 1 tablet (40 mg total) by mouth daily at 6 PM.  . clopidogrel (PLAVIX) 75 MG tablet Take 1 tablet (75 mg total) by mouth daily for 16 days.  . magnesium hydroxide (MILK OF MAGNESIA) 400 MG/5ML suspension If no BM in 3 days, give 30 cc Milk of Magnesium p.o. x 1 dose in 24 hours as needed (Do not use standing constipation  orders for residents with renal failure CFR less than 30. Contact MD for orders) (Physician Order)  . memantine (NAMENDA) 10 MG tablet Take 10 mg by mouth 2 (two) times daily.  . NON FORMULARY MECHANICAL SOFT DIET LOW SODIUM HEART HEALTHY  . rivastigmine (EXELON) 4.6 mg/24hr Place 1 patch (4.6 mg total) onto the skin daily.  Marland Kitchen senna-docusate (SENOKOT-S) 8.6-50 MG tablet Take 1 tablet by mouth at bedtime as needed for mild constipation.   No facility-administered encounter medications on file as of 01/16/2020.    Review of Systems  Unable to obtain due to dementia    Immunization History  Administered Date(s) Administered  . PPD Test 01/09/2020   Pertinent  Health Maintenance Due  Topic Date Due  . PNA vac Low Risk Adult (1 of 2 - PCV13) 01/04/2003  . INFLUENZA VACCINE  07/28/2019   No flowsheet data found.   Vitals:   01/16/20 1010  BP: 140/75  Pulse: 90  Resp: 18  Temp: (!) 97.5 F (36.4 C)  TempSrc: Oral  Weight: 195 lb 8.8 oz (88.7 kg)  Height: 6' (1.829 m)   Body mass index is 26.52 kg/m.  Physical Exam  GENERAL APPEARANCE: Well nourished. In no acute distress. Normal body habitus SKIN:  Skin is warm and dry.  MOUTH and THROAT: Lips are without lesions. Oral mucosa is moist and without lesions.  RESPIRATORY: Breathing is even & unlabored, BS CTAB CARDIAC: RRR, no murmur,no extra heart sounds, no edema GI: Abdomen soft, normal BS, no masses, no tenderness NEUROLOGICAL: There is no tremor. Speech is clear. Left-sided weakness.  PSYCHIATRIC:  Affect and behavior are appropriate  Labs reviewed: Recent Labs    01/10/20 0337 01/11/20 0256 01/12/20 0308  NA 137 137 137  K 4.2 4.0 4.0  CL 104 104 105  CO2 27 23 23   GLUCOSE 111* 118* 143*  BUN 23 23 25*  CREATININE 1.39* 1.35* 1.29*  CALCIUM 8.3* 7.9* 8.4*   Recent Labs    01/07/20 1814 01/08/20 0540 01/12/20 0308  AST 20  --  24  ALT 24  --  21  ALKPHOS 74  --  76  BILITOT 1.9* 1.8* 0.9  PROT 6.3*   --  6.0*  ALBUMIN 3.2*  --  2.6*   Recent Labs    01/07/20 1814 01/07/20 1835 01/10/20 0337  WBC 10.4  --  8.0  NEUTROABS 7.8*  --   --   HGB 14.8 14.6 14.2  HCT 45.4 43.0 41.9  MCV 93.0  --  90.3  PLT 151  --  142*   No results found for: TSH Lab Results  Component Value Date   HGBA1C 5.5 01/09/2020   Lab Results  Component Value Date   CHOL 169 01/09/2020   HDL 34 (L) 01/09/2020   LDLCALC 119 (H) 01/09/2020   TRIG 80 01/09/2020   CHOLHDL 5.0 01/09/2020    Significant Diagnostic Results in last 30 days:  DG Chest 1 View  Result Date: 01/07/2020 CLINICAL DATA:  Left side weakness EXAM: CHEST  1 VIEW COMPARISON:  10/08/2009 FINDINGS: Cardiomegaly. No confluent opacities, effusions or edema. No acute bony abnormality. IMPRESSION: Cardiomegaly.  No active disease. Electronically Signed   By: Charlett Nose M.D.   On: 01/07/2020 17:50   DG Abdomen 1 View  Result Date: 01/07/2020 CLINICAL DATA:  Cough EXAM: ABDOMEN - 1 VIEW COMPARISON:  07/15/2014 FINDINGS: Prior cholecystectomy. Nonobstructive bowel gas pattern. No organomegaly or free air. IMPRESSION: No acute findings. Electronically Signed   By: Charlett Nose M.D.   On: 01/07/2020 17:50   CT Head Wo Contrast  Result Date: 01/07/2020 CLINICAL DATA:  84 year old male with history of Alzheimer's and multiple falls. EXAM: CT HEAD WITHOUT CONTRAST TECHNIQUE: Contiguous axial images were obtained from the base of the skull through the vertex without intravenous contrast. COMPARISON:  None. FINDINGS: Brain: There is moderate age-related atrophy and chronic microvascular ischemic changes. Bilateral frontal hypodensities with loss of gray-white matter discrimination (series 2, image 23 and 25), likely chronic. There is no acute intracranial hemorrhage. No mass effect or midline shift. No extra-axial fluid collection. Vascular: No hyperdense vessel or unexpected calcification. Skull: Normal. Negative for fracture or focal lesion.  Sinuses/Orbits: Mild mucoperiosteal thickening of paranasal sinuses. No air-fluid level. The mastoid air cells are clear. Other: None IMPRESSION: 1. No acute intracranial hemorrhage. 2. Moderate age-related atrophy and chronic microvascular ischemic changes. Multifocal hypodensities with loss of gray-white matter discrimination, likely chronic. If there is high clinical concern for acute infarct further evaluation with MRI is recommended. Electronically Signed   By: Elgie Collard M.D.   On: 01/07/2020 17:42   MR ANGIO HEAD WO CONTRAST  Result Date: 01/08/2020 CLINICAL DATA:  Stroke.  Left-sided weakness EXAM: MRA HEAD WITHOUT CONTRAST TECHNIQUE: Angiographic images of the Circle of Willis were obtained using MRA technique without intravenous contrast. COMPARISON:  MRI head 01/07/2000 FINDINGS: Severe stenosis distal left vertebral artery V4 segment. Right vertebral artery patent. PICA patent bilaterally with the origins not imaged. Mild atherosclerotic disease in the basilar without stenosis. Moderate stenosis right P2. Mild stenosis left P2. Cavernous carotid widely patent bilaterally.  Negative for aneurysm. Occlusion of the right A2 segment corresponding to right anterior cerebral artery infarct. Right middle cerebral artery patent. Multiple areas of irregularity and stenosis right M2 and M3 branches. Left anterior cerebral artery patent with mild atherosclerotic disease. Left middle cerebral artery patent with multiple areas of atherosclerotic irregularity and stenosis left M2 and M3 branches. IMPRESSION: 1. Occlusion of the right A2 segment corresponding to right anterior cerebral artery infarct. 2. Moderate to advanced intracranial atherosclerotic disease as above. Electronically Signed   By: Marlan Palau M.D.   On: 01/08/2020 08:33   MR BRAIN WO CONTRAST  Addendum Date: 01/07/2020   ADDENDUM REPORT: 01/07/2020 23:10 ADDENDUM: Study discussed by telephone with Dr. Mancel Bale on 01/07/2020 at  23:10 . Electronically Signed   By: Odessa Fleming M.D.   On: 01/07/2020 23:10   Result Date: 01/07/2020 CLINICAL DATA:  84 year old male with dimension multiple falls. Left side weakness for 1 week. EXAM: MRI HEAD WITHOUT CONTRAST TECHNIQUE: Multiplanar, multiecho pulse sequences of the brain and surrounding structures were obtained without intravenous contrast. COMPARISON:  Head CT earlier today. Brain MRI 01/31/2005. FINDINGS: Brain: Widely scattered and multifocal confluent areas of restricted diffusion in the right ACA territory affecting the right cingulate and superior frontal gyrus plus some of the body of the corpus callosum (series 4, image 23). Associated cytotoxic edema with T2 and FLAIR hyperintensity. There is petechial hemorrhage in the  right superior frontal gyrus on SWI series 6, image 87. No associated mass effect. Contralateral chronic encephalomalacia in the left ACA territory. No other restricted diffusion. Substantially decreased cerebral volume since 2006 and widespread, confluent bilateral cerebral white matter T2 and FLAIR hyperintensity now. No midline shift, mass effect, evidence of mass lesion, ventriculomegaly, extra-axial collection. Cervicomedullary junction and pituitary are within normal limits. No other cortical encephalomalacia identified. The deep gray matter nuclei also seem relatively spared, there are some small chronic lacunar infarcts of the anterior right basal ganglia. Brainstem and cerebellum appear within normal limits for age. Vascular: Major intracranial vascular flow voids are stable since 2006. Skull and upper cervical spine: Negative visible cervical spine. Visualized bone marrow signal is within normal limits. Sinuses/Orbits: Postoperative changes to both globes otherwise negative orbits. Stable paranasal sinus and mastoid pneumatization. Other: Visible internal auditory structures appear negative. Scalp and face soft tissues appear negative. IMPRESSION: 1. Confluent  Acute Right ACA territory infarcts with mild petechial hemorrhage but no malignant hemorrhagic transformation or mass effect. 2. Chronic left ACA territory infarct. 3. Substantially decreased cerebral volume since a 2006 MRI with advanced but nonspecific cerebral white matter signal changes since that time. Electronically Signed: By: Odessa FlemingH  Hall M.D. On: 01/07/2020 23:05   DG Foot 2 Views Left  Result Date: 01/08/2020 CLINICAL DATA:  Localized swelling and redness of the left foot new onset left-sided weakness and paralysis. EXAM: LEFT FOOT - 2 VIEW COMPARISON:  None. FINDINGS: No acute fracture or traumatic malalignment is evident. There is focal severe arthrosis at the first metatarsophalangeal joint with extensive sclerotic features and bony remodeling. Periarticular osteophyte formations are noted as well as mild soft tissue swelling. Additional degenerative changes throughout the left foot are more mild. Questionable lucency along the anterolateral corner of the cuboid could reflect a small marginal erosion. 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 Recommend correlation with mobility to assess for hallux limitus. Questionable erosion along the anterolateral corner of the cuboid, could reflect some underlying arthropathy. Correlation with exam findings and serologies could be considered. Electronically Signed   By: Kreg ShropshirePrice  DeHay M.D.   On: 01/08/2020 19:01   VAS US TRANSCRANIAL DOPPLER W BUBBLES  Result Date: 01/12/2020  Transcranial Doppler with Bubble Indications: Stroke. Performing Technologist: Marilynne Halstedita Sturdivant RDMS, RVT  Examination Guidelines: A complete evaluation includes B-mode imaging, spectral Doppler, color Doppler, and power Doppler as needed of all accessible portions of each  vessel. Bilateral testing is considered an integral part of a complete examination. Limited examinations for reoccurring indications may be performed as noted.  Summary:  A vascular evaluation was performed. The left middle cerebral artery was studied. An IV was inserted into the patient's right forearm. Verbal informed consent was obtained.  Dr. Roda ShuttersXu performed. No HITS at rest or during Valsalva. No apparent PFO. Negative transcranial Doppler bubble study. *See table(s) above for TCD measurements and observations.  Diagnosing physician: Delia HeadyPramod Sethi MD Electronically signed by Delia HeadyPramod Sethi MD on 01/12/2020 at 11:33:23 AM.    Final    ECHOCARDIOGRAM COMPLETE  Result Date: 01/08/2020   ECHOCARDIOGRAM REPORT   Patient Name:   Alberteen SamROBERT L Quant Date of Exam: 01/08/2020 Medical Rec #:  [id number]       Height:       72.0 in Accession #:    [id number]  Weight:       197.3 lb Date of Birth:  11/25/1936        BSA:          2.12 m Patient Age:    84 years        BP:           148/71 mmHg Patient Gender: M               HR:           70 bpm. Exam Location:  Inpatient Procedure: 2D Echo, Cardiac Doppler and Color Doppler Indications:    Stroke 434.91  History:        Patient has no prior history of Echocardiogram examinations.                 Stroke; Risk Factors:Non-Smoker.  Sonographer:    Tonia GhentJulia Underwood RDCS Referring Phys: [id number] VASUNDHRA RATHORE IMPRESSIONS  1. Left ventricular ejection fraction, by visual estimation, is 55 to 60%. The left ventricle has normal function. There is no left ventricular hypertrophy.  2. The left ventricle has no regional wall motion abnormalities.  3. Global right ventricle has normal systolic function.The right ventricular size is normal. No increase in right ventricular wall thickness.  4. Left atrial size was normal.  5. Right atrial size was normal.  6. The mitral valve is normal in structure. No evidence of mitral valve regurgitation. No evidence of mitral stenosis.  7. The  tricuspid valve is normal in structure.  8. The aortic valve is normal in structure. Aortic valve regurgitation is mild. No evidence of aortic valve sclerosis or stenosis.  9. The pulmonic valve was normal in structure. Pulmonic valve regurgitation is not visualized. 10. Mildly elevated pulmonary artery systolic pressure. 11. The tricuspid regurgitant velocity is 2.80 m/s, and with an assumed right atrial pressure of 3 mmHg, the estimated right ventricular systolic pressure is mildly elevated at 34.4 mmHg. 12. The inferior vena cava is normal in size with greater than 50% respiratory variability, suggesting right atrial pressure of 3 mmHg. FINDINGS  Left Ventricle: Left ventricular ejection fraction, by visual estimation, is 55 to 60%. The left ventricle has normal function. The left ventricle has no regional wall motion abnormalities. The left ventricular internal cavity size was the left ventricle is normal in size. There is no left ventricular hypertrophy. Left ventricular diastolic parameters were normal. Normal left atrial pressure. Right Ventricle: The right ventricular size is normal. No increase in right ventricular wall thickness. Global RV systolic function is has normal systolic function. The tricuspid regurgitant velocity is 2.80 m/s, and with an assumed right atrial pressure  of 3 mmHg, the estimated right ventricular systolic pressure is mildly elevated at 34.4 mmHg. Left Atrium: Left atrial size was normal in size. Right Atrium: Right atrial size was normal in size Pericardium: There is no evidence of pericardial effusion. Mitral Valve: The mitral valve is normal in structure. No evidence of mitral valve regurgitation. No evidence of mitral valve stenosis by observation. Tricuspid Valve: The tricuspid valve is normal in structure. Tricuspid valve regurgitation is trivial. Aortic Valve: The aortic valve is normal in structure. Aortic valve regurgitation is mild. The aortic valve is structurally normal,  with no evidence of sclerosis or stenosis. Pulmonic Valve: The pulmonic valve was normal in structure. Pulmonic valve regurgitation is not visualized. Pulmonic regurgitation is not visualized. Aorta: The aortic root, ascending aorta and aortic arch are all structurally normal, with no evidence of dilitation or obstruction.  Venous: The inferior vena cava is normal in size with greater than 50% respiratory variability, suggesting right atrial pressure of 3 mmHg. IAS/Shunts: No atrial level shunt detected by color flow Doppler. There is no evidence of a patent foramen ovale. No ventricular septal defect is seen or detected. There is no evidence of an atrial septal defect.  LEFT VENTRICLE PLAX 2D LVIDd:         4.70 cm       Diastology LVIDs:         3.39 cm       LV e' lateral:   9.79 cm/s LV PW:         0.83 cm       LV E/e' lateral: 7.3 LV IVS:        0.81 cm       LV e' medial:    6.20 cm/s LVOT diam:     1.80 cm       LV E/e' medial:  11.5 LV SV:         55 ml LV SV Index:   25.76 LVOT Area:     2.54 cm  LV Volumes (MOD) LV area d, A2C:    34.30 cm LV area d, A4C:    33.90 cm LV area s, A2C:    19.00 cm LV area s, A4C:    22.80 cm LV major d, A2C:   7.88 cm LV major d, A4C:   7.50 cm LV major s, A2C:   6.22 cm LV major s, A4C:   6.54 cm LV vol d, MOD A2C: 126.0 ml LV vol d, MOD A4C: 128.0 ml LV vol s, MOD A2C: 51.5 ml LV vol s, MOD A4C: 69.3 ml LV SV MOD A2C:     74.5 ml LV SV MOD A4C:     128.0 ml LV SV MOD BP:      68.7 ml RIGHT VENTRICLE RV S prime:     16.50 cm/s TAPSE (M-mode): 1.9 cm LEFT ATRIUM             Index       RIGHT ATRIUM           Index LA diam:        3.00 cm 1.42 cm/m  RA Area:     15.30 cm LA Vol (A2C):   42.6 ml 20.11 ml/m RA Volume:   37.70 ml  17.80 ml/m LA Vol (A4C):   40.3 ml 19.02 ml/m LA Biplane Vol: 43.4 ml 20.49 ml/m  AORTIC VALVE LVOT Vmax:   120.00 cm/s LVOT Vmean:  75.300 cm/s LVOT VTI:    0.246 m  AORTA Ao Root diam: 3.20 cm MITRAL VALVE                        TRICUSPID  VALVE MV Area (PHT): 2.99 cm             TR Peak grad:   31.4 mmHg MV PHT:        73.66 msec           TR Vmax:        280.00 cm/s MV Decel Time: 254 msec MV E velocity: 71.60 cm/s 103 cm/s  SHUNTS MV A velocity: 80.60 cm/s 70.3 cm/s Systemic VTI:  0.25 m MV E/A ratio:  0.89       1.5       Systemic Diam: 1.80 cm  Rachelle Hora Croitoru MD Electronically signed by  Thurmon Fair MD Signature Date/Time: 01/08/2020/4:58:36 PM    Final    VAS US CAROTID  Result Date: 01/10/2020 Carotid Arterial Duplex Study Indications:       CVA. Comparison Study:  No prior study Performing Technologist: Gertie Fey MHA, RDMS, RVT, RDCS  Examination Guidelines: A complete evaluation includes B-mode imaging, spectral Doppler, color Doppler, and power Doppler as needed of all accessible portions of each vessel. Bilateral testing is considered an integral part of a complete examination. Limited examinations for reoccurring indications may be performed as noted.  Right Carotid Findings: +----------+--------+-------+--------+----------------------+------------------+           PSV cm/sEDV    StenosisPlaque Description    Comments                             cm/s                                                    +----------+--------+-------+--------+----------------------+------------------+ CCA Prox  101     11                                   intimal thickening +----------+--------+-------+--------+----------------------+------------------+ CCA Distal75      9              smooth and                                                                heterogenous                             +----------+--------+-------+--------+----------------------+------------------+ ICA Prox  44      11             smooth and                                                                heterogenous                              +----------+--------+-------+--------+----------------------+------------------+ Unable to visualize distal ICA, ECA, vertebral artery, subclavian artery. Left Carotid Findings: +----------+--------+--------+--------+-----------------------+--------+           PSV cm/sEDV cm/sStenosisPlaque Description     Comments +----------+--------+--------+--------+-----------------------+--------+ CCA Prox  170     16                                              +----------+--------+--------+--------+-----------------------+--------+ CCA Distal78      13              smooth and heterogenous         +----------+--------+--------+--------+-----------------------+--------+  ICA Prox  42      7                                               +----------+--------+--------+--------+-----------------------+--------+ ICA Distal59      12                                              +----------+--------+--------+--------+-----------------------+--------+ ECA       124     6                                               +----------+--------+--------+--------+-----------------------+--------+ +----------+--------+--------+----------------+-------------------+           PSV cm/sEDV cm/sDescribe        Arm Pressure (mmHG) +----------+--------+--------+----------------+-------------------+ [id number]              Multiphasic, WNL                    +----------+--------+--------+----------------+-------------------+ +---------+--------+--+--------+-+---------+ VertebralPSV cm/s18EDV cm/s4Antegrade +---------+--------+--+--------+-+---------+  Summary: Right Carotid: Velocities in the right ICA are consistent with a 1-39% stenosis. Left Carotid: Velocities in the left ICA are consistent with a 1-39% stenosis. Vertebrals:  Left vertebral artery demonstrates antegrade flow. Unable to              visualize right vertebral artery. Subclavians: Normal flow hemodynamics were seen in the  left subclavian artery.              Unable to visualize the right subclavian artery. *See table(s) above for measurements and observations.  Electronically signed by Antony Contras MD on 01/10/2020 at 8:27:56 AM.    Final    VAS [place] LOWER EXTREMITY VENOUS (DVT)  Result Date: 01/09/2020  Lower Venous Study Indications: Stroke.  Comparison Study: No prior study. Performing Technologist: Maudry Mayhew MHA, RDMS, RVT, RDCS  Examination Guidelines: A complete evaluation includes B-mode imaging, spectral Doppler, color Doppler, and power Doppler as needed of all accessible portions of each vessel. Bilateral testing is considered an integral part of a complete examination. Limited examinations for reoccurring indications may be performed as noted.  +---------+---------------+---------+-----------+----------+--------------+ RIGHT    CompressibilityPhasicitySpontaneityPropertiesThrombus Aging +---------+---------------+---------+-----------+----------+--------------+ CFV      Full           Yes      Yes                                 +---------+---------------+---------+-----------+----------+--------------+ SFJ      Full                                                        +---------+---------------+---------+-----------+----------+--------------+ FV Prox  Full                                                        +---------+---------------+---------+-----------+----------+--------------+  FV Mid   Full                                                        +---------+---------------+---------+-----------+----------+--------------+ FV DistalFull                                                        +---------+---------------+---------+-----------+----------+--------------+ PFV      Full                                                        +---------+---------------+---------+-----------+----------+--------------+ POP      Full           Yes      Yes                                  +---------+---------------+---------+-----------+----------+--------------+ PTV      None                    No                                  +---------+---------------+---------+-----------+----------+--------------+   Right Technical Findings: Not visualized segments include peroneal veins.  +---------+---------------+---------+-----------+----------+--------------+ LEFT     CompressibilityPhasicitySpontaneityPropertiesThrombus Aging +---------+---------------+---------+-----------+----------+--------------+ CFV      Full           Yes      Yes                                 +---------+---------------+---------+-----------+----------+--------------+ SFJ      Full                                                        +---------+---------------+---------+-----------+----------+--------------+ FV Prox  Full                                                        +---------+---------------+---------+-----------+----------+--------------+ FV Mid   Full                                                        +---------+---------------+---------+-----------+----------+--------------+ FV DistalFull                                                        +---------+---------------+---------+-----------+----------+--------------+  PFV      Full                                                        +---------+---------------+---------+-----------+----------+--------------+ POP      Full           Yes      Yes                                 +---------+---------------+---------+-----------+----------+--------------+ PTV      Full                                                        +---------+---------------+---------+-----------+----------+--------------+ PERO     Full                                                        +---------+---------------+---------+-----------+----------+--------------+     Summary: Right:  Findings consistent with acute deep vein thrombosis involving the right posterior tibial veins. No cystic structure found in the popliteal fossa. Left: There is no evidence of deep vein thrombosis in the lower extremity. No cystic structure found in the popliteal fossa.  *See table(s) above for measurements and observations. Electronically signed by Coral Else MD on 01/09/2020 at 8:31:35 PM.    Final     Assessment/Plan  1. Late effects of cerebral ischemic stroke -  Head CT was negative for acute intracranial hemorrhage but multifocal hypodensities with loss of gray-white matter discrimination.  Brain MRI showed acute right ACA territory infarcts with mild petechial hemorrhage but no malignant hemorrhagic transformation or mass-effect.  Chronic left ACA territory infarct.  He was given 500 cc normal saline bolus.  Neurology was consulted but he was seen outside the window period  for thrombolysis or endovascular thrombectomy.  He was started on dual antiplatelets for 3 weeks then aspirin alone. -Follow-up with [facility] in 2 to 3 weeks -For PT and OT, for therapeutic strengthening exercises, fall precautions  2. Deep vein thrombosis (DVT) of distal vein of lower extremity, unspecified chronicity, unspecified laterality (HCC) - lower extremity vascular duplex ultrasound showed right distal (posterior tibial veins) DVT.   -Transcranial Doppler with bubble study was negative so no anticoagulation plans since there is no PFO. - continue Plavix for 16 days with ASA EC 81 mg daily -Continue atorvastatin -Will need venous ultrasound of RLE weekly to see the progress of DVT.  If the DVT progresses or involves proximal vein, will need anticoagulation  3. Other dysphagia - for Speech Therapy treatment for safe swallowing and appropriate food texture - aspiration precautions  4. Late onset Alzheimer's disease without behavioral disturbance (HCC) - scored 0/15 on BIMS, continue Exelon and  Namenda, supportive care     Family/ staff Communication:  Discussed plan of care with charge nurse.  Labs/tests ordered:  Weekly ultrasound of right distal (posterior tibial veins)/RLE  Goals of care:   Short-term care   Margel Joens  [name], DNP, FNP-BC [facility] [phone] (Monday-Friday 8:00 a.m. - 5:00 p.m.) [phone] (after hours)

## 2020-01-18 ENCOUNTER — Encounter: Payer: Self-pay | Admitting: Internal Medicine

## 2020-01-18 ENCOUNTER — Non-Acute Institutional Stay (SKILLED_NURSING_FACILITY): Payer: Medicare Other | Admitting: Internal Medicine

## 2020-01-18 DIAGNOSIS — Z66 Do not resuscitate: Secondary | ICD-10-CM

## 2020-01-18 DIAGNOSIS — F0280 Dementia in other diseases classified elsewhere without behavioral disturbance: Secondary | ICD-10-CM

## 2020-01-18 DIAGNOSIS — I6930 Unspecified sequelae of cerebral infarction: Secondary | ICD-10-CM

## 2020-01-18 DIAGNOSIS — G301 Alzheimer's disease with late onset: Secondary | ICD-10-CM | POA: Diagnosis not present

## 2020-01-18 DIAGNOSIS — I824Z9 Acute embolism and thrombosis of unspecified deep veins of unspecified distal lower extremity: Secondary | ICD-10-CM | POA: Diagnosis not present

## 2020-01-18 DIAGNOSIS — I69391 Dysphagia following cerebral infarction: Secondary | ICD-10-CM

## 2020-01-18 NOTE — Progress Notes (Signed)
Provider:  Gwenith Spitz. Renato Gails, D.O., C.M.D. Location:  [facility] Room Number: 320/A Place of Service:  SNF (31)  PCP: Burton Apley, MD Patient Care Team: Burton Apley, MD as PCP - General (Internal Medicine)  Extended Emergency Contact Information Primary Emergency Contact: Desmond Dike Address: [phone]          [place] [address] [place] Home Phone: [phone] Mobile Phone: [phone] Relation: Spouse Secondary Emergency Contact: Isadore, Palecek Mobile Phone: [phone] Relation: Son  Code Status: DNR Goals of Care: Advanced Directive information Advanced Directives 01/18/2020  Does Patient Have a Medical Advance Directive? Yes  Type of Estate agent of [place];Out of facility DNR (pink MOST or yellow form)  Does patient want to make changes to medical advance directive? No - Patient declined  Copy of Healthcare Power of Attorney in Chart? Yes - validated most recent copy scanned in chart (See row information)  Pre-existing out of facility DNR order (yellow form or pink MOST form) Yellow form placed in chart (order not valid for inpatient use)      Chief Complaint  Patient presents with  . New Admit To SNF    New Admission to [facility]     HPI: Patient is a 84 y.o. male seen today for admission to [facility] and rehab s/p hospitalization from 1/11-12/1919 with an acute stroke.  Mr. Jafri has a h/o advanced alzheimer's disease on the exelon patch and namenda.  He lived at home with his wife.  He was confused and unable to have a conversation at his baseline per notes from the hospital from discussion with his wife.  He had developed left sided weakness about 1 week prior to his ED visit on 01/07/20 and they had tried to keep him at home until he slide down to the floor while ambulating with his walker.  He did not have any injuries.  CT was negative for acute pathology but MRI did reveal acute  right and chronic left ACA infarcts.  He had dysphagia and left hemineglect.  He was allowed permissive htn at first.  He also had fever and tachypnea but cxr and covid test negative.  Wound up eventually getting an [place] of his extremities with Right distal DVT in posterior tibial veins but not "true" DVT.  He'd already been started on a combination of asa 81mg  daily and plavix 75mg  daily for a 16 day course from 1/19.  He was to get weekly of the LEs with his left foot was red (negative xrays and blood cultures) and if the clot progresses, be put on additional anticoagulation.  No PFO was found on echo with bubble study.    He is on a dysphagia 2 diet, but speech here reports he's making good progress and she planned to advance him to finger foods.  She was working on his hemineglect.  He scored 0/15 on BIMS (speech alone could have caused).    He also had high total bili at 1.9 but repeat was normal and other liver tests all unremarkable.  He is here for rehab, but it will need to be determined if his wife can care for him with his care needs after he completes his PT, OT, ST here.  He's getting PT to strengthen the LLE.    Past Medical History:  Diagnosis Date  . Alzheimer disease (HCC) 09/25/2019   Past Surgical History:  Procedure Laterality Date  . APPENDECTOMY    . BACK  SURGERY    . CHOLECYSTECTOMY    . HERNIA REPAIR      Social History   Socioeconomic History  . Marital status: Married    Spouse name: Deloris  . Number of children: 2  . Years of education: 8  . Highest education level: Not on file  Occupational History  . Occupation: Retired   Tobacco Use  . Smoking status: Never Smoker  . Smokeless tobacco: Never Used  Substance and Sexual Activity  . Alcohol use: No  . Drug use: No  . Sexual activity: Not on file  Other Topics Concern  . Not on file  Social History Narrative   Lives w/ wife   Caffeine use:  2 cups coffee per day   Left-handed   Social  Determinants of Health   Financial Resource Strain:   . Difficulty of Paying Living Expenses: Not on file  Food Insecurity:   . Worried About Programme researcher, broadcasting/film/video in the Last Year: Not on file  . Ran Out of Food in the Last Year: Not on file  Transportation Needs:   . Lack of Transportation (Medical): Not on file  . Lack of Transportation (Non-Medical): Not on file  Physical Activity:   . Days of Exercise per Week: Not on file  . Minutes of Exercise per Session: Not on file  Stress:   . Feeling of Stress : Not on file  Social Connections:   . Frequency of Communication with Friends and Family: Not on file  . Frequency of Social Gatherings with Friends and Family: Not on file  . Attends Religious Services: Not on file  . Active Member of Clubs or Organizations: Not on file  . Attends Banker Meetings: Not on file  . Marital Status: Not on file    reports that he has never smoked. He has never used smokeless tobacco. He reports that he does not drink alcohol or use drugs.  Functional Status Survey:  dependent in adls, speech is word salad, left hemineglect  Family History  Problem Relation Age of Onset  . Pneumonia Mother   . Kidney failure Father     Health Maintenance  Topic Date Due  . TETANUS/TDAP  01/04/1957  . PNA vac Low Risk Adult (1 of 2 - PCV13) 01/04/2003  . INFLUENZA VACCINE  07/28/2019    Allergies  Allergen Reactions  . Demerol [Meperidine] Nausea And Vomiting    Outpatient Encounter Medications as of 01/18/2020  Medication Sig  . aspirin EC 81 MG EC tablet Take 1 tablet (81 mg total) by mouth daily.  Marland Kitchen atorvastatin (LIPITOR) 40 MG tablet Take 1 tablet (40 mg total) by mouth daily at 6 PM.  . clopidogrel (PLAVIX) 75 MG tablet Take 1 tablet (75 mg total) by mouth daily for 16 days.  . magnesium hydroxide (MILK OF MAGNESIA) 400 MG/5ML suspension If no BM in 3 days, give 30 cc Milk of Magnesium p.o. x 1 dose in 24 hours as needed (Do not use  standing constipation orders for residents with renal failure CFR less than 30. Contact MD for orders) (Physician Order)  . memantine (NAMENDA) 10 MG tablet Take 10 mg by mouth 2 (two) times daily.  . NON FORMULARY MECHANICAL SOFT DIET LOW SODIUM HEART HEALTHY  . rivastigmine (EXELON) 4.6 mg/24hr Place 1 patch (4.6 mg total) onto the skin daily.  Marland Kitchen senna-docusate (SENOKOT-S) 8.6-50 MG tablet Take 1 tablet by mouth at bedtime as needed for mild constipation.   No  facility-administered encounter medications on file as of 01/18/2020.    Review of Systems  Reason unable to perform ROS: ros from nursing.  Constitutional: Negative for chills and fever.       Appears wt stable but all the weights are recently the same?  HENT: Negative for congestion, hearing loss and sore throat.   Eyes: Negative for blurred vision.       Hemineglect left  Respiratory: Negative for cough and shortness of breath.   Cardiovascular: Negative for chest pain and palpitations.  Gastrointestinal: Negative for abdominal pain, blood in stool, constipation and melena.       Dysphagia  Genitourinary: Negative for dysuria.  Musculoskeletal: Negative for back pain, falls and joint pain.  Skin: Negative for itching and rash.  Neurological: Positive for speech change, focal weakness and weakness. Negative for dizziness and loss of consciousness.  Endo/Heme/Allergies: Bruises/bleeds easily.  Psychiatric/Behavioral: Positive for memory loss. Negative for depression. The patient is not nervous/anxious and does not have insomnia.     Vitals:   01/18/20 0914  BP: (!) 151/75  Pulse: 66  Resp: 16  Temp: (!) 97 F (36.1 C)  TempSrc: Oral  Weight: 195 lb 8.8 oz (88.7 kg)  Height: 6' (1.829 m)   Body mass index is 26.52 kg/m. Physical Exam Constitutional:      General: He is not in acute distress.    Appearance: Normal appearance. He is not toxic-appearing.  HENT:     Head: Normocephalic and atraumatic.     Right Ear:  External ear normal.     Left Ear: External ear normal.  Cardiovascular:     Rate and Rhythm: Normal rate and regular rhythm.     Pulses: Normal pulses.     Heart sounds: Normal heart sounds.  Pulmonary:     Effort: Pulmonary effort is normal.     Breath sounds: Normal breath sounds. No wheezing, rhonchi or rales.  Abdominal:     General: Bowel sounds are normal. There is no distension.     Palpations: Abdomen is soft. There is no mass.     Tenderness: There is no abdominal tenderness. There is no guarding or rebound.  Musculoskeletal:        General: No swelling or tenderness.     Cervical back: Neck supple.     Right lower leg: No edema.     Left lower leg: No edema.  Skin:    Coloration: Skin is pale.  Neurological:     Mental Status: He is alert.     Cranial Nerves: Cranial nerve deficit present.     Motor: Weakness present.     Coordination: Coordination abnormal.     Gait: Gait abnormal.     Comments: RUE 5/5; LUE 4+/5; RLE able to wiggle toes, slightly lift foot; LLE only visible muscle contraction attempt in thigh; did follow my commands appropriately; had left hemineglect  Psychiatric:     Comments: Smiling and talkative, but speech is word salad     Labs reviewed: Basic Metabolic Panel: Recent Labs    01/10/20 0337 01/11/20 0256 01/12/20 0308  NA 137 137 137  K 4.2 4.0 4.0  CL 104 104 105  CO2 27 23 23   GLUCOSE 111* 118* 143*  BUN 23 23 25*  CREATININE 1.39* 1.35* 1.29*  CALCIUM 8.3* 7.9* 8.4*   Liver Function Tests: Recent Labs    01/07/20 1814 01/08/20 0540 01/12/20 0308  AST 20  --  24  ALT 24  --  21  ALKPHOS 74  --  76  BILITOT 1.9* 1.8* 0.9  PROT 6.3*  --  6.0*  ALBUMIN 3.2*  --  2.6*   No results for input(s): LIPASE, AMYLASE in the last 8760 hours. No results for input(s): AMMONIA in the last 8760 hours. CBC: Recent Labs    01/07/20 1814 01/07/20 1835 01/10/20 0337  WBC 10.4  --  8.0  NEUTROABS 7.8*  --   --   HGB 14.8 14.6 14.2   HCT 45.4 43.0 41.9  MCV 93.0  --  90.3  PLT 151  --  142*   Cardiac Enzymes: No results for input(s): CKTOTAL, CKMB, CKMBINDEX, TROPONINI in the last 8760 hours. BNP: Invalid input(s): POCBNP Lab Results  Component Value Date   HGBA1C 5.5 01/09/2020   No results found for: TSH No results found for: VITAMINB12 No results found for: FOLATE No results found for: IRON, TIBC, FERRITIN  Imaging and Procedures obtained prior to SNF admission: DG Chest 1 View  Result Date: 01/07/2020 CLINICAL DATA:  Left side weakness EXAM: CHEST  1 VIEW COMPARISON:  10/08/2009 FINDINGS: Cardiomegaly. No confluent opacities, effusions or edema. No acute bony abnormality. IMPRESSION: Cardiomegaly.  No active disease. Electronically Signed   By: Charlett Nose M.D.   On: 01/07/2020 17:50   DG Abdomen 1 View  Result Date: 01/07/2020 CLINICAL DATA:  Cough EXAM: ABDOMEN - 1 VIEW COMPARISON:  07/15/2014 FINDINGS: Prior cholecystectomy. Nonobstructive bowel gas pattern. No organomegaly or free air. IMPRESSION: No acute findings. Electronically Signed   By: Charlett Nose M.D.   On: 01/07/2020 17:50   CT Head Wo Contrast  Result Date: 01/07/2020 CLINICAL DATA:  84 year old male with history of Alzheimer's and multiple falls. EXAM: CT HEAD WITHOUT CONTRAST TECHNIQUE: Contiguous axial images were obtained from the base of the skull through the vertex without intravenous contrast. COMPARISON:  None. FINDINGS: Brain: There is moderate age-related atrophy and chronic microvascular ischemic changes. Bilateral frontal hypodensities with loss of gray-white matter discrimination (series 2, image 23 and 25), likely chronic. There is no acute intracranial hemorrhage. No mass effect or midline shift. No extra-axial fluid collection. Vascular: No hyperdense vessel or unexpected calcification. Skull: Normal. Negative for fracture or focal lesion. Sinuses/Orbits: Mild mucoperiosteal thickening of paranasal sinuses. No air-fluid level.  The mastoid air cells are clear. Other: None IMPRESSION: 1. No acute intracranial hemorrhage. 2. Moderate age-related atrophy and chronic microvascular ischemic changes. Multifocal hypodensities with loss of gray-white matter discrimination, likely chronic. If there is high clinical concern for acute infarct further evaluation with MRI is recommended. Electronically Signed   By: Elgie Collard M.D.   On: 01/07/2020 17:42   MR ANGIO HEAD WO CONTRAST  Result Date: 01/08/2020 CLINICAL DATA:  Stroke.  Left-sided weakness EXAM: MRA HEAD WITHOUT CONTRAST TECHNIQUE: Angiographic images of the Circle of Willis were obtained using MRA technique without intravenous contrast. COMPARISON:  MRI head 01/07/2000 FINDINGS: Severe stenosis distal left vertebral artery V4 segment. Right vertebral artery patent. PICA patent bilaterally with the origins not imaged. Mild atherosclerotic disease in the basilar without stenosis. Moderate stenosis right P2. Mild stenosis left P2. Cavernous carotid widely patent bilaterally.  Negative for aneurysm. Occlusion of the right A2 segment corresponding to right anterior cerebral artery infarct. Right middle cerebral artery patent. Multiple areas of irregularity and stenosis right M2 and M3 branches. Left anterior cerebral artery patent with mild atherosclerotic disease. Left middle cerebral artery patent with multiple areas of atherosclerotic irregularity and stenosis left M2 and M3  branches. IMPRESSION: 1. Occlusion of the right A2 segment corresponding to right anterior cerebral artery infarct. 2. Moderate to advanced intracranial atherosclerotic disease as above. Electronically Signed   By: Marlan Palauharles  Clark M.D.   On: 01/08/2020 08:33   MR BRAIN WO CONTRAST  Addendum Date: 01/07/2020   ADDENDUM REPORT: 01/07/2020 23:10 ADDENDUM: Study discussed by telephone with Dr. Mancel BaleELLIOTT WENTZ on 01/07/2020 at 23:10 . Electronically Signed   By: Odessa FlemingH  Hall M.D.   On: 01/07/2020 23:10   Result Date:  01/07/2020 CLINICAL DATA:  84 year old male with dimension multiple falls. Left side weakness for 1 week. EXAM: MRI HEAD WITHOUT CONTRAST TECHNIQUE: Multiplanar, multiecho pulse sequences of the brain and surrounding structures were obtained without intravenous contrast. COMPARISON:  Head CT earlier today. Brain MRI 01/31/2005. FINDINGS: Brain: Widely scattered and multifocal confluent areas of restricted diffusion in the right ACA territory affecting the right cingulate and superior frontal gyrus plus some of the body of the corpus callosum (series 4, image 23). Associated cytotoxic edema with T2 and FLAIR hyperintensity. There is petechial hemorrhage in the right superior frontal gyrus on SWI series 6, image 87. No associated mass effect. Contralateral chronic encephalomalacia in the left ACA territory. No other restricted diffusion. Substantially decreased cerebral volume since 2006 and widespread, confluent bilateral cerebral white matter T2 and FLAIR hyperintensity now. No midline shift, mass effect, evidence of mass lesion, ventriculomegaly, extra-axial collection. Cervicomedullary junction and pituitary are within normal limits. No other cortical encephalomalacia identified. The deep gray matter nuclei also seem relatively spared, there are some small chronic lacunar infarcts of the anterior right basal ganglia. Brainstem and cerebellum appear within normal limits for age. Vascular: Major intracranial vascular flow voids are stable since 2006. Skull and upper cervical spine: Negative visible cervical spine. Visualized bone marrow signal is within normal limits. Sinuses/Orbits: Postoperative changes to both globes otherwise negative orbits. Stable paranasal sinus and mastoid pneumatization. Other: Visible internal auditory structures appear negative. Scalp and face soft tissues appear negative. IMPRESSION: 1. Confluent Acute Right ACA territory infarcts with mild petechial hemorrhage but no malignant  hemorrhagic transformation or mass effect. 2. Chronic left ACA territory infarct. 3. Substantially decreased cerebral volume since a 2006 MRI with advanced but nonspecific cerebral white matter signal changes since that time. Electronically Signed: By: Odessa FlemingH  Hall M.D. On: 01/07/2020 23:05   DG Foot 2 Views Left  Result Date: 01/08/2020 CLINICAL DATA:  Localized swelling and redness of the left foot new onset left-sided weakness and paralysis. EXAM: LEFT FOOT - 2 VIEW COMPARISON:  None. FINDINGS: No acute fracture or traumatic malalignment is evident. There is focal severe arthrosis at the first metatarsophalangeal joint with extensive sclerotic features and bony remodeling. Periarticular osteophyte formations are noted as well as mild soft tissue swelling. Additional degenerative changes throughout the left foot are more mild. Questionable lucency along the anterolateral corner of the cuboid could reflect a small marginal erosion. 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 Recommend correlation with mobility to assess for hallux limitus. Questionable erosion along the anterolateral corner of the cuboid, could reflect some underlying arthropathy. Correlation with exam findings and serologies could be considered. Electronically Signed   By: Kreg ShropshirePrice  DeHay M.D.   On: 01/08/2020 19:01   ECHOCARDIOGRAM COMPLETE  Result Date: 01/08/2020   ECHOCARDIOGRAM REPORT   Patient Name:  Alberteen Sam Date of Exam: 01/08/2020 Medical Rec #:  [id number]       Height:       72.0 in Accession #:    [id number]      Weight:       197.3 lb Date of Birth:  1936-06-14        BSA:          2.12 m Patient Age:    84 years        BP:           148/71 mmHg Patient Gender: M               HR:           70 bpm. Exam Location:   Inpatient Procedure: 2D Echo, Cardiac Doppler and Color Doppler Indications:    Stroke 434.91  History:        Patient has no prior history of Echocardiogram examinations.                 Stroke; Risk Factors:Non-Smoker.  Sonographer:    Tonia Ghent RDCS Referring Phys: [id number] VASUNDHRA RATHORE IMPRESSIONS  1. Left ventricular ejection fraction, by visual estimation, is 55 to 60%. The left ventricle has normal function. There is no left ventricular hypertrophy.  2. The left ventricle has no regional wall motion abnormalities.  3. Global right ventricle has normal systolic function.The right ventricular size is normal. No increase in right ventricular wall thickness.  4. Left atrial size was normal.  5. Right atrial size was normal.  6. The mitral valve is normal in structure. No evidence of mitral valve regurgitation. No evidence of mitral stenosis.  7. The tricuspid valve is normal in structure.  8. The aortic valve is normal in structure. Aortic valve regurgitation is mild. No evidence of aortic valve sclerosis or stenosis.  9. The pulmonic valve was normal in structure. Pulmonic valve regurgitation is not visualized. 10. Mildly elevated pulmonary artery systolic pressure. 11. The tricuspid regurgitant velocity is 2.80 m/s, and with an assumed right atrial pressure of 3 mmHg, the estimated right ventricular systolic pressure is mildly elevated at 34.4 mmHg. 12. The inferior vena cava is normal in size with greater than 50% respiratory variability, suggesting right atrial pressure of 3 mmHg. FINDINGS  Left Ventricle: Left ventricular ejection fraction, by visual estimation, is 55 to 60%. The left ventricle has normal function. The left ventricle has no regional wall motion abnormalities. The left ventricular internal cavity size was the left ventricle is normal in size. There is no left ventricular hypertrophy. Left ventricular diastolic parameters were normal. Normal left atrial pressure. Right Ventricle: The  right ventricular size is normal. No increase in right ventricular wall thickness. Global RV systolic function is has normal systolic function. The tricuspid regurgitant velocity is 2.80 m/s, and with an assumed right atrial pressure  of 3 mmHg, the estimated right ventricular systolic pressure is mildly elevated at 34.4 mmHg. Left Atrium: Left atrial size was normal in size. Right Atrium: Right atrial size was normal in size Pericardium: There is no evidence of pericardial effusion. Mitral Valve: The mitral valve is normal in structure. No evidence of mitral valve regurgitation. No evidence of mitral valve stenosis by observation. Tricuspid Valve: The tricuspid valve is normal in structure. Tricuspid valve regurgitation is trivial. Aortic Valve: The aortic valve is normal in structure. Aortic valve regurgitation is mild. The aortic valve is structurally normal, with no evidence of sclerosis or stenosis. Pulmonic Valve:  The pulmonic valve was normal in structure. Pulmonic valve regurgitation is not visualized. Pulmonic regurgitation is not visualized. Aorta: The aortic root, ascending aorta and aortic arch are all structurally normal, with no evidence of dilitation or obstruction. Venous: The inferior vena cava is normal in size with greater than 50% respiratory variability, suggesting right atrial pressure of 3 mmHg. IAS/Shunts: No atrial level shunt detected by color flow Doppler. There is no evidence of a patent foramen ovale. No ventricular septal defect is seen or detected. There is no evidence of an atrial septal defect.  LEFT VENTRICLE PLAX 2D LVIDd:         4.70 cm       Diastology LVIDs:         3.39 cm       LV e' lateral:   9.79 cm/s LV PW:         0.83 cm       LV E/e' lateral: 7.3 LV IVS:        0.81 cm       LV e' medial:    6.20 cm/s LVOT diam:     1.80 cm       LV E/e' medial:  11.5 LV SV:         55 ml LV SV Index:   25.76 LVOT Area:     2.54 cm  LV Volumes (MOD) LV area d, A2C:    34.30 cm LV  area d, A4C:    33.90 cm LV area s, A2C:    19.00 cm LV area s, A4C:    22.80 cm LV major d, A2C:   7.88 cm LV major d, A4C:   7.50 cm LV major s, A2C:   6.22 cm LV major s, A4C:   6.54 cm LV vol d, MOD A2C: 126.0 ml LV vol d, MOD A4C: 128.0 ml LV vol s, MOD A2C: 51.5 ml LV vol s, MOD A4C: 69.3 ml LV SV MOD A2C:     74.5 ml LV SV MOD A4C:     128.0 ml LV SV MOD BP:      68.7 ml RIGHT VENTRICLE RV S prime:     16.50 cm/s TAPSE (M-mode): 1.9 cm LEFT ATRIUM             Index       RIGHT ATRIUM           Index LA diam:        3.00 cm 1.42 cm/m  RA Area:     15.30 cm LA Vol (A2C):   42.6 ml 20.11 ml/m RA Volume:   37.70 ml  17.80 ml/m LA Vol (A4C):   40.3 ml 19.02 ml/m LA Biplane Vol: 43.4 ml 20.49 ml/m  AORTIC VALVE LVOT Vmax:   120.00 cm/s LVOT Vmean:  75.300 cm/s LVOT VTI:    0.246 m  AORTA Ao Root diam: 3.20 cm MITRAL VALVE                        TRICUSPID VALVE MV Area (PHT): 2.99 cm             TR Peak grad:   31.4 mmHg MV PHT:        73.66 msec           TR Vmax:        280.00 cm/s MV Decel Time: 254 msec MV E velocity: 71.60 cm/s 103 cm/s  SHUNTS MV A velocity:  80.60 cm/s 70.3 cm/s Systemic VTI:  0.25 m MV E/A ratio:  0.89       1.5       Systemic Diam: 1.80 cm  Rachelle Hora Croitoru MD Electronically signed by Thurmon Fair MD Signature Date/Time: 01/08/2020/4:58:36 PM    Final     Assessment/Plan 1. Late effects of cerebral ischemic stroke -primarily worsening of speech, dysphagia, left hemineglect and LLE monoplegia, may have had some baseline right LE weakness based on MRI finding of prior stroke left ACA -cont PT, OT, ST  2. Deep vein thrombosis (DVT) of distal vein of lower extremity, unspecified chronicity, unspecified laterality (HCC) -is on asa and plavix for further stroke prevention already thru 2/4 (then will just be asa alone) -is high fall risk with advanced dementia -had a repeat negative venous doppler here today -seems excessive to check dopplers weekly on this poor man on two  blood thinners  -opted to d/c weekly checks and simply check once more one week after he comes off the plavix--hopefully, he will be more mobile by then and will not develop a full-blown DVT -goals are more comfort-based for him  3. Dysphagia due to stroke -doing well, ST advancing diet to finger foods which should be easier for him with his advanced dementia  4. Late onset Alzheimer's disease without behavioral disturbance (HCC) -is on exelon patch and namenda--if he does not make progress with therapy, might consider weaning these with his advanced stage of dementia with full adl dependence  5. DNR (do not resuscitate) - Do not attempt resuscitation (DNR) confirmed and entered in epic  Family/ staff Communication: discussed with snf nurse  Labs/tests ordered:  One more venous doppler in feb one week after plavix d/cd--would not want him on asa, plavix and eliquis or xarelto--he'd be certain to bleed somewhere or fall and have a major head injury   Levern Kalka L. Yicel Shannon, D.O. Geriatrics [facility] [address], [place] [address] Cell Phone (Mon-Fri 8am-5pm):  [phone] On Call:  [phone] & follow prompts after 5pm & weekends Office Phone:  [phone] Office Fax:  [phone]

## 2020-01-31 ENCOUNTER — Encounter: Payer: Self-pay | Admitting: Adult Health

## 2020-01-31 ENCOUNTER — Non-Acute Institutional Stay (SKILLED_NURSING_FACILITY): Payer: Medicare Other | Admitting: Adult Health

## 2020-01-31 DIAGNOSIS — U071 COVID-19: Secondary | ICD-10-CM | POA: Diagnosis not present

## 2020-01-31 NOTE — Progress Notes (Signed)
Location:  [facility] Room Number: 318/B Place of Service:  SNF (31) Provider:  Kenard Gower, DNP, FNP-BC  Patient Care Team: Burton Apley, MD as PCP - General (Internal Medicine)  Extended Emergency Contact Information Primary Emergency Contact: Seab, Axel Address: [address]          [place] [address] [place] of [place] Home Phone: [phone] Mobile Phone: [phone] Relation: Spouse Secondary Emergency Contact: Hawkin, Charo Mobile Phone: [phone] Relation: Son  Code Status:  DNR  Goals of care: Advanced Directive information Advanced Directives 01/31/2020  Does Patient Have a Medical Advance Directive? Yes  Type of Estate agent of [place];Out of facility DNR (pink MOST or yellow form)  Does patient want to make changes to medical advance directive? No - Patient declined  Copy of Healthcare Power of Attorney in Chart? Yes - validated most recent copy scanned in chart (See row information)  Pre-existing out of facility DNR order (yellow form or pink MOST form) Yellow form placed in chart (order not valid for inpatient use)     Chief Complaint  Patient presents with   Acute Visit    Positive COVID-19 Test    HPI:  Pt is a 84 y.o. male who has PMH of  Advanced Alzheimer's dementia. He is currently having a short-term rehabilitation post hospitalization 01/07/20 to 01/14/20 for cerebral ischemic stroke. Head CT was negative for acute intracranial hemorrhage but multifocal hypodensities with loss of gray-white matter discrimination.  Brain MRI showed acute right ACA territory infarcts with mild petechial hemorrhage but no malignant hemorrhagic transformation or mass-effect.  Chronic left ACA territory infarct.  Neurology was consulted but was seen outside the window.  For thrombolysis or endovascular thrombectomy.  He was a started on dual antiplatelets for 3 weeks then aspirin alone.  He just  completed the completed the COVID-19 quarantine period post hospitalization. He was tested for COVID-19 Ag and tested positive. NO SOB nor fever reported.   Past Medical History:  Diagnosis Date   Alzheimer disease (HCC) 09/25/2019   Past Surgical History:  Procedure Laterality Date   APPENDECTOMY     BACK SURGERY     CHOLECYSTECTOMY     HERNIA REPAIR      Allergies  Allergen Reactions   Demerol [Meperidine] Nausea And Vomiting    Outpatient Encounter Medications as of 01/31/2020  Medication Sig   aspirin EC 81 MG EC tablet Take 1 tablet (81 mg total) by mouth daily.   atorvastatin (LIPITOR) 40 MG tablet Take 1 tablet (40 mg total) by mouth daily at 6 PM.   magnesium hydroxide (MILK OF MAGNESIA) 400 MG/5ML suspension If no BM in 3 days, give 30 cc Milk of Magnesium p.o. x 1 dose in 24 hours as needed (Do not use standing constipation orders for residents with renal failure CFR less than 30. Contact MD for orders) (Physician Order)   memantine (NAMENDA) 10 MG tablet Take 10 mg by mouth 2 (two) times daily.   rivastigmine (EXELON) 4.6 mg/24hr Place 1 patch (4.6 mg total) onto the skin daily.   senna-docusate (SENOKOT-S) 8.6-50 MG tablet Take 1 tablet by mouth at bedtime as needed for mild constipation.   [DISCONTINUED] clopidogrel (PLAVIX) 75 MG tablet Take 1 tablet (75 mg total) by mouth daily for 16 days.   [DISCONTINUED] NON FORMULARY MECHANICAL SOFT DIET LOW SODIUM HEART HEALTHY   No facility-administered encounter medications on file as of 01/31/2020.    Review of Systems  Unable  to obtain due to dementia    Immunization History  Administered Date(s) Administered   PPD Test 01/09/2020   Pertinent  Health Maintenance Due  Topic Date Due   PNA vac Low Risk Adult (1 of 2 - PCV13) 01/04/2003   INFLUENZA VACCINE  07/28/2019   No flowsheet data found.   Vitals:   01/31/20 1602  BP: 139/74  Pulse: (!) 59  Resp: 18  Temp: (!) 97.5 F (36.4 C)  TempSrc:  Oral  Weight: 186 lb (84.4 kg)  Height: 6' (1.829 m)   Body mass index is 25.23 kg/m.  Physical Exam  GENERAL APPEARANCE: Well nourished. In no acute distress. Normal body habitus SKIN:  Skin is warm and dry.  MOUTH and THROAT: Lips are without lesions. Oral mucosa is moist and without lesions.  RESPIRATORY: Breathing is even & unlabored, BS CTAB CARDIAC: RRR, no murmur,no extra heart sounds, no edema GI: Abdomen soft, normal BS, no masses, no tenderness NEUROLOGICAL: There is no tremor. Speech is clear. Left-sided weakness. PSYCHIATRIC:  Affect and behavior are appropriate  Labs reviewed: Recent Labs    01/10/20 0337 01/11/20 0256 01/12/20 0308  NA 137 137 137  K 4.2 4.0 4.0  CL 104 104 105  CO2 27 23 23   GLUCOSE 111* 118* 143*  BUN 23 23 25*  CREATININE 1.39* 1.35* 1.29*  CALCIUM 8.3* 7.9* 8.4*   Recent Labs    01/07/20 1814 01/08/20 0540 01/12/20 0308  AST 20  --  24  ALT 24  --  21  ALKPHOS 74  --  76  BILITOT 1.9* 1.8* 0.9  PROT 6.3*  --  6.0*  ALBUMIN 3.2*  --  2.6*   Recent Labs    01/07/20 1814 01/07/20 1835 01/10/20 0337  WBC 10.4  --  8.0  NEUTROABS 7.8*  --   --   HGB 14.8 14.6 14.2  HCT 45.4 43.0 41.9  MCV 93.0  --  90.3  PLT 151  --  142*    Lab Results  Component Value Date   HGBA1C 5.5 01/09/2020   Lab Results  Component Value Date   CHOL 169 01/09/2020   HDL 34 (L) 01/09/2020   LDLCALC 119 (H) 01/09/2020   TRIG 80 01/09/2020   CHOLHDL 5.0 01/09/2020    Significant Diagnostic Results in last 30 days:  DG Chest 1 View  Result Date: 01/07/2020 CLINICAL DATA:  Left side weakness EXAM: CHEST  1 VIEW COMPARISON:  10/08/2009 FINDINGS: Cardiomegaly. No confluent opacities, effusions or edema. No acute bony abnormality. IMPRESSION: Cardiomegaly.  No active disease. Electronically Signed   By: Rolm Baptise M.D.   On: 01/07/2020 17:50   DG Abdomen 1 View  Result Date: 01/07/2020 CLINICAL DATA:  Cough EXAM: ABDOMEN - 1 VIEW COMPARISON:   07/15/2014 FINDINGS: Prior cholecystectomy. Nonobstructive bowel gas pattern. No organomegaly or free air. IMPRESSION: No acute findings. Electronically Signed   By: Rolm Baptise M.D.   On: 01/07/2020 17:50   CT Head Wo Contrast  Result Date: 01/07/2020 CLINICAL DATA:  84 year old male with history of Alzheimer's and multiple falls. EXAM: CT HEAD WITHOUT CONTRAST TECHNIQUE: Contiguous axial images were obtained from the base of the skull through the vertex without intravenous contrast. COMPARISON:  None. FINDINGS: Brain: There is moderate age-related atrophy and chronic microvascular ischemic changes. Bilateral frontal hypodensities with loss of gray-white matter discrimination (series 2, image 23 and 25), likely chronic. There is no acute intracranial hemorrhage. No mass effect or midline shift. No  extra-axial fluid collection. Vascular: No hyperdense vessel or unexpected calcification. Skull: Normal. Negative for fracture or focal lesion. Sinuses/Orbits: Mild mucoperiosteal thickening of paranasal sinuses. No air-fluid level. The mastoid air cells are clear. Other: None IMPRESSION: 1. No acute intracranial hemorrhage. 2. Moderate age-related atrophy and chronic microvascular ischemic changes. Multifocal hypodensities with loss of gray-white matter discrimination, likely chronic. If there is high clinical concern for acute infarct further evaluation with MRI is recommended. Electronically Signed   By: Elgie Collard M.D.   On: 01/07/2020 17:42   MR ANGIO HEAD WO CONTRAST  Result Date: 01/08/2020 CLINICAL DATA:  Stroke.  Left-sided weakness EXAM: MRA HEAD WITHOUT CONTRAST TECHNIQUE: Angiographic images of the Circle of Willis were obtained using MRA technique without intravenous contrast. COMPARISON:  MRI head 01/07/2000 FINDINGS: Severe stenosis distal left vertebral artery V4 segment. Right vertebral artery patent. PICA patent bilaterally with the origins not imaged. Mild atherosclerotic disease in the  basilar without stenosis. Moderate stenosis right P2. Mild stenosis left P2. Cavernous carotid widely patent bilaterally.  Negative for aneurysm. Occlusion of the right A2 segment corresponding to right anterior cerebral artery infarct. Right middle cerebral artery patent. Multiple areas of irregularity and stenosis right M2 and M3 branches. Left anterior cerebral artery patent with mild atherosclerotic disease. Left middle cerebral artery patent with multiple areas of atherosclerotic irregularity and stenosis left M2 and M3 branches. IMPRESSION: 1. Occlusion of the right A2 segment corresponding to right anterior cerebral artery infarct. 2. Moderate to advanced intracranial atherosclerotic disease as above. Electronically Signed   By: Marlan Palau M.D.   On: 01/08/2020 08:33   MR BRAIN WO CONTRAST  Addendum Date: 01/07/2020   ADDENDUM REPORT: 01/07/2020 23:10 ADDENDUM: Study discussed by telephone with Dr. Mancel Bale on 01/07/2020 at 23:10 . Electronically Signed   By: Odessa Fleming M.D.   On: 01/07/2020 23:10   Result Date: 01/07/2020 CLINICAL DATA:  84 year old male with dimension multiple falls. Left side weakness for 1 week. EXAM: MRI HEAD WITHOUT CONTRAST TECHNIQUE: Multiplanar, multiecho pulse sequences of the brain and surrounding structures were obtained without intravenous contrast. COMPARISON:  Head CT earlier today. Brain MRI 01/31/2005. FINDINGS: Brain: Widely scattered and multifocal confluent areas of restricted diffusion in the right ACA territory affecting the right cingulate and superior frontal gyrus plus some of the body of the corpus callosum (series 4, image 23). Associated cytotoxic edema with T2 and FLAIR hyperintensity. There is petechial hemorrhage in the right superior frontal gyrus on SWI series 6, image 87. No associated mass effect. Contralateral chronic encephalomalacia in the left ACA territory. No other restricted diffusion. Substantially decreased cerebral volume since 2006 and  widespread, confluent bilateral cerebral white matter T2 and FLAIR hyperintensity now. No midline shift, mass effect, evidence of mass lesion, ventriculomegaly, extra-axial collection. Cervicomedullary junction and pituitary are within normal limits. No other cortical encephalomalacia identified. The deep gray matter nuclei also seem relatively spared, there are some small chronic lacunar infarcts of the anterior right basal ganglia. Brainstem and cerebellum appear within normal limits for age. Vascular: Major intracranial vascular flow voids are stable since 2006. Skull and upper cervical spine: Negative visible cervical spine. Visualized bone marrow signal is within normal limits. Sinuses/Orbits: Postoperative changes to both globes otherwise negative orbits. Stable paranasal sinus and mastoid pneumatization. Other: Visible internal auditory structures appear negative. Scalp and face soft tissues appear negative. IMPRESSION: 1. Confluent Acute Right ACA territory infarcts with mild petechial hemorrhage but no malignant hemorrhagic transformation or mass effect. 2. Chronic left  ACA territory infarct. 3. Substantially decreased cerebral volume since a 2006 MRI with advanced but nonspecific cerebral white matter signal changes since that time. Electronically Signed: By: Odessa Fleming M.D. On: 01/07/2020 23:05   DG Foot 2 Views Left  Result Date: 01/08/2020 CLINICAL DATA:  Localized swelling and redness of the left foot new onset left-sided weakness and paralysis. EXAM: LEFT FOOT - 2 VIEW COMPARISON:  None. FINDINGS: No acute fracture or traumatic malalignment is evident. There is focal severe arthrosis at the first metatarsophalangeal joint with extensive sclerotic features and bony remodeling. Periarticular osteophyte formations are noted as well as mild soft tissue swelling. Additional degenerative changes throughout the left foot are more mild. Questionable lucency along the anterolateral corner of the cuboid could  reflect a small marginal erosion. 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 Recommend correlation with mobility to assess for hallux limitus. Questionable erosion along the anterolateral corner of the cuboid, could reflect some underlying arthropathy. Correlation with exam findings and serologies could be considered. Electronically Signed   By: Kreg Shropshire M.D.   On: 01/08/2020 19:01   VAS [place] TRANSCRANIAL DOPPLER W BUBBLES  Result Date: 01/12/2020  Transcranial Doppler with Bubble Indications: Stroke. Performing Technologist: Marilynne Halsted RDMS, RVT  Examination Guidelines: A complete evaluation includes B-mode imaging, spectral Doppler, color Doppler, and power Doppler as needed of all accessible portions of each vessel. Bilateral testing is considered an integral part of a complete examination. Limited examinations for reoccurring indications may be performed as noted.  Summary:  A vascular evaluation was performed. The left middle cerebral artery was studied. An IV was inserted into the patient's right forearm. Verbal informed consent was obtained.  Dr. Roda Shutters performed. No HITS at rest or during Valsalva. No apparent PFO. Negative transcranial Doppler bubble study. *See table(s) above for TCD measurements and observations.  Diagnosing physician: Delia Heady MD Electronically signed by Delia Heady MD on 01/12/2020 at 11:33:23 AM.    Final    ECHOCARDIOGRAM COMPLETE  Result Date: 01/08/2020   ECHOCARDIOGRAM REPORT   Patient Name:   JAWANZA VOLESKY Date of Exam: 01/08/2020 Medical Rec #:  [id number]       Height:       72.0 in Accession #:    [id number]      Weight:       197.3 lb Date of Birth:  [birth year]        BSA:          2.12 m Patient Age:    84 years        BP:           148/71 mmHg  Patient Gender: M               HR:           70 bpm. Exam Location:  Inpatient Procedure: 2D Echo, Cardiac Doppler and Color Doppler Indications:    Stroke 434.91  History:        Patient has no prior history of Echocardiogram examinations.                 Stroke; Risk Factors:Non-Smoker.  Sonographer:    Tonia Ghent RDCS Referring Phys: [id number] VASUNDHRA RATHORE IMPRESSIONS  1. Left ventricular ejection fraction, by visual estimation, is 55 to 60%. The left ventricle  has normal function. There is no left ventricular hypertrophy.  2. The left ventricle has no regional wall motion abnormalities.  3. Global right ventricle has normal systolic function.The right ventricular size is normal. No increase in right ventricular wall thickness.  4. Left atrial size was normal.  5. Right atrial size was normal.  6. The mitral valve is normal in structure. No evidence of mitral valve regurgitation. No evidence of mitral stenosis.  7. The tricuspid valve is normal in structure.  8. The aortic valve is normal in structure. Aortic valve regurgitation is mild. No evidence of aortic valve sclerosis or stenosis.  9. The pulmonic valve was normal in structure. Pulmonic valve regurgitation is not visualized. 10. Mildly elevated pulmonary artery systolic pressure. 11. The tricuspid regurgitant velocity is 2.80 m/s, and with an assumed right atrial pressure of 3 mmHg, the estimated right ventricular systolic pressure is mildly elevated at 34.4 mmHg. 12. The inferior vena cava is normal in size with greater than 50% respiratory variability, suggesting right atrial pressure of 3 mmHg. FINDINGS  Left Ventricle: Left ventricular ejection fraction, by visual estimation, is 55 to 60%. The left ventricle has normal function. The left ventricle has no regional wall motion abnormalities. The left ventricular internal cavity size was the left ventricle is normal in size. There is no left ventricular hypertrophy. Left ventricular diastolic  parameters were normal. Normal left atrial pressure. Right Ventricle: The right ventricular size is normal. No increase in right ventricular wall thickness. Global RV systolic function is has normal systolic function. The tricuspid regurgitant velocity is 2.80 m/s, and with an assumed right atrial pressure  of 3 mmHg, the estimated right ventricular systolic pressure is mildly elevated at 34.4 mmHg. Left Atrium: Left atrial size was normal in size. Right Atrium: Right atrial size was normal in size Pericardium: There is no evidence of pericardial effusion. Mitral Valve: The mitral valve is normal in structure. No evidence of mitral valve regurgitation. No evidence of mitral valve stenosis by observation. Tricuspid Valve: The tricuspid valve is normal in structure. Tricuspid valve regurgitation is trivial. Aortic Valve: The aortic valve is normal in structure. Aortic valve regurgitation is mild. The aortic valve is structurally normal, with no evidence of sclerosis or stenosis. Pulmonic Valve: The pulmonic valve was normal in structure. Pulmonic valve regurgitation is not visualized. Pulmonic regurgitation is not visualized. Aorta: The aortic root, ascending aorta and aortic arch are all structurally normal, with no evidence of dilitation or obstruction. Venous: The inferior vena cava is normal in size with greater than 50% respiratory variability, suggesting right atrial pressure of 3 mmHg. IAS/Shunts: No atrial level shunt detected by color flow Doppler. There is no evidence of a patent foramen ovale. No ventricular septal defect is seen or detected. There is no evidence of an atrial septal defect.  LEFT VENTRICLE PLAX 2D LVIDd:         4.70 cm       Diastology LVIDs:         3.39 cm       LV e' lateral:   9.79 cm/s LV PW:         0.83 cm       LV E/e' lateral: 7.3 LV IVS:        0.81 cm       LV e' medial:    6.20 cm/s LVOT diam:     1.80 cm       LV E/e' medial:  11.5 LV SV:  55 ml LV SV Index:   25.76  LVOT Area:     2.54 cm  LV Volumes (MOD) LV area d, A2C:    34.30 cm LV area d, A4C:    33.90 cm LV area s, A2C:    19.00 cm LV area s, A4C:    22.80 cm LV major d, A2C:   7.88 cm LV major d, A4C:   7.50 cm LV major s, A2C:   6.22 cm LV major s, A4C:   6.54 cm LV vol d, MOD A2C: 126.0 ml LV vol d, MOD A4C: 128.0 ml LV vol s, MOD A2C: 51.5 ml LV vol s, MOD A4C: 69.3 ml LV SV MOD A2C:     74.5 ml LV SV MOD A4C:     128.0 ml LV SV MOD BP:      68.7 ml RIGHT VENTRICLE RV S prime:     16.50 cm/s TAPSE (M-mode): 1.9 cm LEFT ATRIUM             Index       RIGHT ATRIUM           Index LA diam:        3.00 cm 1.42 cm/m  RA Area:     15.30 cm LA Vol (A2C):   42.6 ml 20.11 ml/m RA Volume:   37.70 ml  17.80 ml/m LA Vol (A4C):   40.3 ml 19.02 ml/m LA Biplane Vol: 43.4 ml 20.49 ml/m  AORTIC VALVE LVOT Vmax:   120.00 cm/s LVOT Vmean:  75.300 cm/s LVOT VTI:    0.246 m  AORTA Ao Root diam: 3.20 cm MITRAL VALVE                        TRICUSPID VALVE MV Area (PHT): 2.99 cm             TR Peak grad:   31.4 mmHg MV PHT:        73.66 msec           TR Vmax:        280.00 cm/s MV Decel Time: 254 msec MV E velocity: 71.60 cm/s 103 cm/s  SHUNTS MV A velocity: 80.60 cm/s 70.3 cm/s Systemic VTI:  0.25 m MV E/A ratio:  0.89       1.5       Systemic Diam: 1.80 cm  Rachelle HoraMihai Croitoru MD Electronically signed by Thurmon FairMihai Croitoru MD Signature Date/Time: 01/08/2020/4:58:36 PM    Final    VAS US CAROTID  Result Date: 01/10/2020 Carotid Arterial Duplex Study Indications:       CVA. Comparison Study:  No prior study Performing Technologist: Gertie FeyMichelle Simonetti MHA, RDMS, RVT, RDCS  Examination Guidelines: A complete evaluation includes B-mode imaging, spectral Doppler, color Doppler, and power Doppler as needed of all accessible portions of each vessel. Bilateral testing is considered an integral part of a complete examination. Limited examinations for reoccurring indications may be performed as noted.  Right Carotid Findings:  +----------+--------+-------+--------+----------------------+------------------+             PSV cm/s EDV     Stenosis Plaque Description     Comments                                 cm/s                                                        +----------+--------+-------+--------+----------------------+------------------+  CCA Prox   101      11                                      intimal thickening  +----------+--------+-------+--------+----------------------+------------------+  CCA Distal 75       9                smooth and                                                                       heterogenous                               +----------+--------+-------+--------+----------------------+------------------+  ICA Prox   44       11               smooth and                                                                       heterogenous                               +----------+--------+-------+--------+----------------------+------------------+ Unable to visualize distal ICA, ECA, vertebral artery, subclavian artery. Left Carotid Findings: +----------+--------+--------+--------+-----------------------+--------+             PSV cm/s EDV cm/s Stenosis Plaque Description      Comments  +----------+--------+--------+--------+-----------------------+--------+  CCA Prox   170      16                                                  +----------+--------+--------+--------+-----------------------+--------+  CCA Distal 78       13                smooth and heterogenous           +----------+--------+--------+--------+-----------------------+--------+  ICA Prox   42       7                                                   +----------+--------+--------+--------+-----------------------+--------+  ICA Distal 59       12                                                  +----------+--------+--------+--------+-----------------------+--------+  ECA        124      6                                                    +----------+--------+--------+--------+-----------------------+--------+ +----------+--------+--------+----------------+-------------------+  PSV cm/s EDV cm/s Describe         Arm Pressure (mmHG)  +----------+--------+--------+----------------+-------------------+  Subclavian 91                Multiphasic, WNL                      +----------+--------+--------+----------------+-------------------+ +---------+--------+--+--------+-+---------+  Vertebral PSV cm/s 18 EDV cm/s 4 Antegrade  +---------+--------+--+--------+-+---------+  Summary: Right Carotid: Velocities in the right ICA are consistent with a 1-39% stenosis. Left Carotid: Velocities in the left ICA are consistent with a 1-39% stenosis. Vertebrals:  Left vertebral artery demonstrates antegrade flow. Unable to              visualize right vertebral artery. Subclavians: Normal flow hemodynamics were seen in the left subclavian artery.              Unable to visualize the right subclavian artery. *See table(s) above for measurements and observations.  Electronically signed by Delia HeadyPramod Sethi MD on 01/10/2020 at 8:27:56 AM.    Final    VAS US LOWER EXTREMITY VENOUS (DVT)  Result Date: 01/09/2020  Lower Venous Study Indications: Stroke.  Comparison Study: No prior study. Performing Technologist: Gertie FeyMichelle Simonetti MHA, RDMS, RVT, RDCS  Examination Guidelines: A complete evaluation includes B-mode imaging, spectral Doppler, color Doppler, and power Doppler as needed of all accessible portions of each vessel. Bilateral testing is considered an integral part of a complete examination. Limited examinations for reoccurring indications may be performed as noted.  +---------+---------------+---------+-----------+----------+--------------+  RIGHT     Compressibility Phasicity Spontaneity Properties Thrombus Aging  +---------+---------------+---------+-----------+----------+--------------+  CFV       Full            Yes       Yes                                     +---------+---------------+---------+-----------+----------+--------------+  SFJ       Full                                                             +---------+---------------+---------+-----------+----------+--------------+  FV Prox   Full                                                             +---------+---------------+---------+-----------+----------+--------------+  FV Mid    Full                                                             +---------+---------------+---------+-----------+----------+--------------+  FV Distal Full                                                             +---------+---------------+---------+-----------+----------+--------------+  PFV       Full                                                             +---------+---------------+---------+-----------+----------+--------------+  POP       Full            Yes       Yes                                    +---------+---------------+---------+-----------+----------+--------------+  PTV       None                      No                                     +---------+---------------+---------+-----------+----------+--------------+   Right Technical Findings: Not visualized segments include peroneal veins.  +---------+---------------+---------+-----------+----------+--------------+  LEFT      Compressibility Phasicity Spontaneity Properties Thrombus Aging  +---------+---------------+---------+-----------+----------+--------------+  CFV       Full            Yes       Yes                                    +---------+---------------+---------+-----------+----------+--------------+  SFJ       Full                                                             +---------+---------------+---------+-----------+----------+--------------+  FV Prox   Full                                                             +---------+---------------+---------+-----------+----------+--------------+  FV Mid    Full                                                              +---------+---------------+---------+-----------+----------+--------------+  FV Distal Full                                                             +---------+---------------+---------+-----------+----------+--------------+  PFV       Full                                                             +---------+---------------+---------+-----------+----------+--------------+  POP       Full            Yes       Yes                                    +---------+---------------+---------+-----------+----------+--------------+  PTV       Full                                                             +---------+---------------+---------+-----------+----------+--------------+  PERO      Full                                                             +---------+---------------+---------+-----------+----------+--------------+     Summary: Right: Findings consistent with acute deep vein thrombosis involving the right posterior tibial veins. No cystic structure found in the popliteal fossa. Left: There is no evidence of deep vein thrombosis in the lower extremity. No cystic structure found in the popliteal fossa.  *See table(s) above for measurements and observations. Electronically signed by Coral Else MD on 01/09/2020 at 8:31:35 PM.    Final     Assessment/Plan  1. COVID-19 - COVID-19 Ag was positive, will start azithromycin 250 mg 1 tab p.o. twice daily x5 days, Florastor 250 mg 1 capsule p.o. twice daily x8 days, zinc sulfate 220 mg 1 capsule p.o. daily x5 days, guaifenesin 100 mg / 5 mL give 10 mL = 200 mg every 6 hours x5 days  -Monitor for  SOB and fever   Family/ staff Communication: Discussed plan of care with resident and charge nurse.  Labs/tests ordered:  None  Goals of care:   Short-term care   Kenard Gower, DNP, FNP-BC [facility] [phone] (Monday-Friday 8:00 a.m. - 5:00 p.m.) [phone] (after hours)

## 2020-02-07 ENCOUNTER — Encounter: Payer: Self-pay | Admitting: Adult Health

## 2020-02-07 ENCOUNTER — Non-Acute Institutional Stay (SKILLED_NURSING_FACILITY): Payer: Medicare Other | Admitting: Adult Health

## 2020-02-07 DIAGNOSIS — G301 Alzheimer's disease with late onset: Secondary | ICD-10-CM

## 2020-02-07 DIAGNOSIS — U071 COVID-19: Secondary | ICD-10-CM

## 2020-02-07 DIAGNOSIS — I6930 Unspecified sequelae of cerebral infarction: Secondary | ICD-10-CM | POA: Diagnosis not present

## 2020-02-07 DIAGNOSIS — I824Z9 Acute embolism and thrombosis of unspecified deep veins of unspecified distal lower extremity: Secondary | ICD-10-CM | POA: Diagnosis not present

## 2020-02-07 DIAGNOSIS — F0280 Dementia in other diseases classified elsewhere without behavioral disturbance: Secondary | ICD-10-CM

## 2020-02-07 DIAGNOSIS — I69391 Dysphagia following cerebral infarction: Secondary | ICD-10-CM | POA: Diagnosis not present

## 2020-02-07 NOTE — Progress Notes (Signed)
Location:  [facility] Room Number: 318/B Place of Service:  SNF (31) Provider:  Kenard Gower, DNP, FNP-BC  Patient Care Team: Burton Apley, MD as PCP - General (Internal Medicine)  Extended Emergency Contact Information Primary Emergency Contact: Kyle Kyle [name], [place] Address: [address]          [place] [address] [name] Home Phone: [phone] Mobile Phone: [phone] Relation: Spouse Secondary Emergency Contact: Adar, Rase Mobile Phone: [phone] Relation: Son  Code Status:  DNR  Goals of care: Advanced Directive information Advanced Directives 02/07/2020  Does Patient Have a Medical Advance Directive? Yes  Type of Advance Directive Out of facility DNR (pink MOST or yellow form);Healthcare Power of Attorney  Does patient want to make changes to medical advance directive? No - Patient declined  Copy of Healthcare Power of Attorney in Chart? Yes - validated most recent copy scanned in chart (See row information)  Pre-existing out of facility DNR order (yellow form or pink MOST form) Yellow form placed in chart (order not valid for inpatient use)     Chief Complaint  Patient presents with  . Follow-up    Follow Up Covid-19    HPI:  Pt is a 84 y.o. [name] who recently completed treatment for COVID-19 -azithromycin, Florastor, guaifenesin and Zinc sulfate. No reported fever, coughing nor SOB. He was seen in his room today. He was holding a Rubik's cube puzzle. He is verbally responsive but does not answer queries. He utters a different topic when talked to. He has a DVT on his RLE upon admission to facility. He was put on Plavix X 3 weeks and ASA EC 81 mg. Venous doppler of RLE done on 1/Kyle/21 at [facility] was negative. He is currently only on ASA EC 81 mg daily and for repeat venous doppler study of RLE today. Speech therapist continues to work on his dysphagia.   He was admitted to [facility]  on 01/15/20 from hospitalization 01/07/20 to 01/14/20 for cerebral ischemic stroke. He was admitted to [facility] for a short-term rehabilitation.   Past Medical History:  Diagnosis Date  . Alzheimer disease (HCC) 09/25/2019   Past Surgical History:  Procedure Laterality Date  . APPENDECTOMY    . BACK SURGERY    . CHOLECYSTECTOMY    . HERNIA REPAIR      Allergies  Allergen Reactions  . Demerol [Meperidine] Nausea And Vomiting    Outpatient Encounter Medications as of 02/07/2020  Medication Sig  . aspirin EC 81 MG EC tablet Take 1 tablet (81 mg total) by mouth daily.  Marland Kitchen atorvastatin (LIPITOR) 40 MG tablet Take 1 tablet (40 mg total) by mouth daily at 6 PM.  . magnesium hydroxide (MILK OF MAGNESIA) 400 MG/5ML suspension If no BM in 3 days, give 30 cc Milk of Magnesium p.o. x 1 dose in 24 hours as needed (Do not use standing constipation orders for residents with renal failure CFR less than 30. Contact MD for orders) (Physician Order)  . memantine (NAMENDA) 10 MG tablet Take 10 mg by mouth 2 (two) times daily.  . rivastigmine (EXELON) 4.6 mg/24hr Place 1 patch (4.6 mg total) onto the skin daily.  Marland Kitchen senna-docusate (SENOKOT-S) 8.6-50 MG tablet Take 1 tablet by mouth at bedtime as needed for mild constipation.   No facility-administered encounter medications on file as of 02/07/2020.    Review of Systems  Unable to obtain due to dementia    Immunization History  Administered Date(s) Administered  .  PPD Test 01/09/2020   Pertinent  Health Maintenance Due  Topic Date Due  . PNA vac Low Risk Adult (1 of 2 - PCV13) 01/04/2003  . INFLUENZA VACCINE  07/28/2019   No flowsheet data found.   Vitals:   02/07/20 1507  BP: 117/72  Pulse: 65  Temp: (!) 97.5 F (36.4 C)  TempSrc: Oral  Weight: 185 lb 6.4 oz (84.1 kg)  Height: 6' (1.829 m)   Body mass index is 25.14 kg/m.  Physical Exam  GENERAL APPEARANCE: Well nourished. In no acute distress. Normal body habitus SKIN:  Skin is  warm and dry.  MOUTH and THROAT: Lips are without lesions. Oral mucosa is moist and without lesions. Tongue is normal in shape, size, and color and without lesions RESPIRATORY: Breathing is even & unlabored, BS CTAB CARDIAC: RRR, no murmur,no extra heart sounds, no edema GI: Abdomen soft, normal BS, no masses, no tenderness NEUROLOGICAL: There is no tremor. Speech is clear. Confused X3. PSYCHIATRIC:  Affect and behavior are appropriate  Labs reviewed: Recent Labs    01/10/20 0337 01/11/20 0256 01/12/20 0308  NA 137 137 137  K 4.2 4.0 4.0  CL 104 104 105  CO2 27 23 23   GLUCOSE 111* 118* 143*  BUN 23 23 25*  CREATININE 1.39* 1.35* 1.29*  CALCIUM 8.3* 7.9* 8.4*   Recent Labs    01/07/20 1814 01/08/20 0540 01/12/20 0308  AST 20  --  24  ALT 24  --  21  ALKPHOS 74  --  76  BILITOT 1.9* 1.8* 0.9  PROT 6.3*  --  6.0*  ALBUMIN 3.2*  --  2.6*   Recent Labs    01/07/20 1814 01/07/20 1835 01/10/20 0337  WBC 10.4  --  8.0  NEUTROABS 7.8*  --   --   HGB 14.8 14.6 14.2  HCT 45.4 43.0 41.9  MCV 93.0  --  90.3  PLT 151  --  142*   No results found for: TSH Lab Results  Component Value Date   HGBA1C 5.5 01/09/2020   Lab Results  Component Value Date   CHOL 169 01/09/2020   HDL 34 (L) 01/09/2020   LDLCALC 119 (H) 01/09/2020   TRIG 80 01/09/2020   CHOLHDL 5.0 01/09/2020    Significant Diagnostic Results in last 30 days:  DG Foot 2 Views Left  Result Date: 01/08/2020 CLINICAL DATA:  Localized swelling and redness of the left foot new onset left-sided weakness and paralysis. EXAM: LEFT FOOT - 2 VIEW COMPARISON:  None. FINDINGS: No acute fracture or traumatic malalignment is evident. There is focal severe arthrosis at the first metatarsophalangeal joint with extensive sclerotic features and bony remodeling. Periarticular osteophyte formations are noted as well as mild soft tissue swelling. Additional degenerative changes throughout the left foot are more mild. Questionable  lucency along the anterolateral corner of the cuboid could reflect a small marginal erosion. 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 Recommend correlation with mobility to assess for hallux limitus. Questionable erosion along the anterolateral corner of the cuboid, could reflect some underlying arthropathy. Correlation with exam findings and serologies could be considered. Electronically Signed   By: Lovena Le M.D.   On: 01/08/2020 19:01   VAS [place] TRANSCRANIAL DOPPLER W BUBBLES  Result Date: 01/12/2020  Transcranial Doppler with Bubble  Indications: Stroke. Performing Technologist: Marilynne Halsted RDMS, RVT  Examination Guidelines: A complete evaluation includes B-mode imaging, spectral Doppler, color Doppler, and power Doppler as needed of all accessible portions of each vessel. Bilateral testing is considered an integral part of a complete examination. Limited examinations for reoccurring indications may be performed as noted.  Summary:  A vascular evaluation was performed. The left middle cerebral artery was studied. An IV was inserted into the patient's right forearm. Verbal informed consent was obtained.  Dr. Roda Shutters performed. No HITS at rest or during Valsalva. No apparent PFO. Negative transcranial Doppler bubble study. *See table(s) above for TCD measurements and observations.  Diagnosing physician: Delia Heady MD Electronically signed by Delia Heady MD on 01/12/2020 at 11:33:23 AM.    Final    VAS US CAROTID  Result Date: 01/10/2020 Carotid Arterial Duplex Study Indications:       CVA. Comparison Study:  No prior study Performing Technologist: Gertie Fey MHA, RDMS, RVT, RDCS  Examination Guidelines: A complete evaluation includes B-mode imaging, spectral Doppler, color Doppler,  and power Doppler as needed of all accessible portions of each vessel. Bilateral testing is considered an integral part of a complete examination. Limited examinations for reoccurring indications may be performed as noted.  Right Carotid Findings: +----------+--------+-------+--------+----------------------+------------------+           PSV cm/sEDV    StenosisPlaque Description    Comments                             cm/s                                                    +----------+--------+-------+--------+----------------------+------------------+ CCA Prox  101     11                                   intimal thickening +----------+--------+-------+--------+----------------------+------------------+ CCA Distal75      9              smooth and                                                                heterogenous                             +----------+--------+-------+--------+----------------------+------------------+ ICA Prox  44      11             smooth and                                                                heterogenous                             +----------+--------+-------+--------+----------------------+------------------+  Unable to visualize distal ICA, ECA, vertebral artery, subclavian artery. Left Carotid Findings: +----------+--------+--------+--------+-----------------------+--------+           PSV cm/sEDV cm/sStenosisPlaque Description     Comments +----------+--------+--------+--------+-----------------------+--------+ CCA Prox  170     16                                              +----------+--------+--------+--------+-----------------------+--------+ CCA Distal78      13              smooth and heterogenous         +----------+--------+--------+--------+-----------------------+--------+ ICA Prox  42      7                                                +----------+--------+--------+--------+-----------------------+--------+ ICA Distal59      12                                              +----------+--------+--------+--------+-----------------------+--------+ ECA       124     6                                               +----------+--------+--------+--------+-----------------------+--------+ +----------+--------+--------+----------------+-------------------+           PSV cm/sEDV cm/sDescribe        Arm Pressure (mmHG) +----------+--------+--------+----------------+-------------------+ [id number]              Multiphasic, WNL                    +----------+--------+--------+----------------+-------------------+ +---------+--------+--+--------+-+---------+ VertebralPSV cm/s18EDV cm/s4Antegrade +---------+--------+--+--------+-+---------+  Summary: Right Carotid: Velocities in the right ICA are consistent with a 1-39% stenosis. Left Carotid: Velocities in the left ICA are consistent with a 1-39% stenosis. Vertebrals:  Left vertebral artery demonstrates antegrade flow. Unable to              visualize right vertebral artery. Subclavians: Normal flow hemodynamics were seen in the left subclavian artery.              Unable to visualize the right subclavian artery. *See table(s) above for measurements and observations.  Electronically signed by Delia Heady MD on 01/10/2020 at 8:27:56 AM.    Final    VAS [place] LOWER EXTREMITY VENOUS (DVT)  Result Date: 01/09/2020  Lower Venous Study Indications: Stroke.  Comparison Study: No prior study. Performing Technologist: Gertie Fey MHA, RDMS, RVT, RDCS  Examination Guidelines: A complete evaluation includes B-mode imaging, spectral Doppler, color Doppler, and power Doppler as needed of all accessible portions of each vessel. Bilateral testing is considered an integral part of a complete examination. Limited examinations for reoccurring indications may be performed as noted.   +---------+---------------+---------+-----------+----------+--------------+ RIGHT    CompressibilityPhasicitySpontaneityPropertiesThrombus Aging +---------+---------------+---------+-----------+----------+--------------+ CFV      Full           Yes      Yes                                 +---------+---------------+---------+-----------+----------+--------------+  SFJ      Full                                                        +---------+---------------+---------+-----------+----------+--------------+ FV Prox  Full                                                        +---------+---------------+---------+-----------+----------+--------------+ FV Mid   Full                                                        +---------+---------------+---------+-----------+----------+--------------+ FV DistalFull                                                        +---------+---------------+---------+-----------+----------+--------------+ PFV      Full                                                        +---------+---------------+---------+-----------+----------+--------------+ POP      Full           Yes      Yes                                 +---------+---------------+---------+-----------+----------+--------------+ PTV      None                    No                                  +---------+---------------+---------+-----------+----------+--------------+   Right Technical Findings: Not visualized segments include peroneal veins.  +---------+---------------+---------+-----------+----------+--------------+ LEFT     CompressibilityPhasicitySpontaneityPropertiesThrombus Aging +---------+---------------+---------+-----------+----------+--------------+ CFV      Full           Yes      Yes                                 +---------+---------------+---------+-----------+----------+--------------+ SFJ      Full                                                         +---------+---------------+---------+-----------+----------+--------------+ FV Prox  Full                                                        +---------+---------------+---------+-----------+----------+--------------+  FV Mid   Full                                                        +---------+---------------+---------+-----------+----------+--------------+ FV DistalFull                                                        +---------+---------------+---------+-----------+----------+--------------+ PFV      Full                                                        +---------+---------------+---------+-----------+----------+--------------+ POP      Full           Yes      Yes                                 +---------+---------------+---------+-----------+----------+--------------+ PTV      Full                                                        +---------+---------------+---------+-----------+----------+--------------+ PERO     Full                                                        +---------+---------------+---------+-----------+----------+--------------+     Summary: Right: Findings consistent with acute deep vein thrombosis involving the right posterior tibial veins. No cystic structure found in the popliteal fossa. Left: There is no evidence of deep vein thrombosis in the lower extremity. No cystic structure found in the popliteal fossa.  *See table(s) above for measurements and observations. Electronically signed by Coral ElseVance Brabham MD on 01/09/2020 at 8:31:35 PM.    Final     Assessment/Plan  1. COVID-19 -Completed azithromycin, zinc sulfate and guaifenesin  2. Late effects of cerebral ischemic stroke - continue PT and OT, for therapeutic strengthening exercises -Follow-up with [place] [facility] - cotinue ASA EC 81 mg daily and atorvastatin  3. Deep vein thrombosis (DVT) of distal vein of lower extremity,  unspecified chronicity, unspecified laterality (HCC) - 2nd [facility]ppler [facility] on his RLE was negative for DVT  4. Dysphagia as late effect of stroke - ST working on his swallowing techniques/safe swallowing and appropriate food texture  5. Late onset Alzheimer's disease without behavioral disturbance (HCC) -Continue rivastigmine and memantine, supportive care and fall precautions    Family/ staff Communication:  Discussed plan of care with charge nurse.  Labs/tests ordered: None  Goals of care:   Short-term care   Kenard GowerMonina [name], DNP, FNP-BC [facility] [phone] (Monday-Friday 8:00 a.m. - 5:00 p.m.) [phone] (after hours)

## 2020-02-07 NOTE — Progress Notes (Deleted)
Location:  [facility] Room Number: 318/B Place of Service:  SNF (31) Provider:  Kenard GowerMedina-Vargas, [place], DNP, FNP-BC  Patient Care Team: Burton Apleyoberts, Ronald, MD as PCP - General (Internal Medicine)  Extended Emergency Contact Information Primary Emergency Contact: Desmond Dikengold,Deloris J [id number]          [place] [id number] [place] Home Phone: [phone] Mobile Phone: [phone] Relation: Spouse Secondary Emergency Contact: Frankey Pootngold, Rusty Mobile Phone: [phone] Relation: Son  Code Status:  DNR  Goals of care: Advanced Directive information Advanced Directives 02/07/2020  Does Patient Have a Medical Advance Directive? Yes  Type of Advance Directive Out of facility DNR (pink MOST or yellow form);Healthcare Power of Attorney  Does patient want to make changes to medical advance directive? No - Patient declined  Copy of Healthcare Power of Attorney in Chart? Yes - validated most recent copy scanned in chart (See row information)  Pre-existing out of facility DNR order (yellow form or pink MOST form) Yellow form placed in chart (order not valid for inpatient use)     Chief Complaint  Patient presents with  . Follow-up    Follow Up Covid-19    HPI:  Pt is a 84 y.o. male seen today for medical management of chronic diseases.     Past Medical History:  Diagnosis Date  . Alzheimer disease (HCC) 09/25/2019   Past Surgical History:  Procedure Laterality Date  . APPENDECTOMY    . BACK SURGERY    . CHOLECYSTECTOMY    . HERNIA REPAIR      Allergies  Allergen Reactions  . Demerol [Meperidine] Nausea And Vomiting    Outpatient Encounter Medications as of 02/07/2020  Medication Sig  . aspirin EC 81 MG EC tablet Take 1 tablet (81 mg total) by mouth daily.  Marland Kitchen. atorvastatin (LIPITOR) 40 MG tablet Take 1 tablet (40 mg total) by mouth daily at 6 PM.  . magnesium hydroxide (MILK OF MAGNESIA) 400 MG/5ML suspension If no BM in 3 days,  give 30 cc Milk of Magnesium p.o. x 1 dose in 24 hours as needed (Do not use standing constipation orders for residents with renal failure CFR less than 30. Contact MD for orders) (Physician Order)  . memantine (NAMENDA) 10 MG tablet Take 10 mg by mouth 2 (two) times daily.  . rivastigmine (EXELON) 4.6 mg/24hr Place 1 patch (4.6 mg total) onto the skin daily.  Marland Kitchen. senna-docusate (SENOKOT-S) 8.6-50 MG tablet Take 1 tablet by mouth at bedtime as needed for mild constipation.   No facility-administered encounter medications on file as of 02/07/2020.    Review of Systems  GENERAL: No change in appetite, no fatigue, no weight changes, no fever, chills or weakness SKIN: Denies rash, itching, wounds, ulcer sores, or nail abnormalities EYES: Denies change in vision, dry eyes, eye pain, itching or discharge EARS: Denies change in hearing, ringing in ears, or earache NOSE: Denies nasal congestion or epistaxis MOUTH and THROAT: Denies oral discomfort, gingival pain or bleeding, pain from teeth or hoarseness   RESPIRATORY: no cough, SOB, DOE, wheezing, hemoptysis CARDIAC: No chest pain, edema or palpitations GI: No abdominal pain, diarrhea, constipation, heart burn, nausea or vomiting GU: Denies dysuria, frequency, hematuria, incontinence, or discharge MUSCULOSKELETAL: Denies joint pain, muscle pain, back pain, restricted movement, or unusual weakness CIRCULATION: Denies claudication, edema of legs, varicosities, or cold extremities NEUROLOGICAL: Denies dizziness, syncope, numbness, or headache PSYCHIATRIC: Denies feelings of depression or anxiety. No report of hallucinations, insomnia, paranoia, or  agitation ENDOCRINE: Denies polyphagia, polyuria, polydipsia, heat or cold intolerance HEME/LYMPH: Denies excessive bruising, petechia, enlarged lymph nodes, or bleeding problems IMMUNOLOGIC: Denies history of frequent infections, AIDS, or use of immunosuppressive agents   Immunization History    Administered Date(s) Administered  . PPD Test 01/09/2020   Pertinent  Health Maintenance Due  Topic Date Due  . PNA vac Low Risk Adult (1 of 2 - PCV13) 01/04/2003  . INFLUENZA VACCINE  07/28/2019   No flowsheet data found.   Vitals:   02/07/20 1507  BP: 117/72  Pulse: 65  Temp: (!) 97.5 F (36.4 C)  TempSrc: Oral  Weight: 185 lb 6.4 oz (84.1 kg)  Height: 6' (1.829 m)   Body mass index is 25.14 kg/m.  Physical Exam  GENERAL APPEARANCE: Well nourished. In no acute distress. Normal body habitus SKIN:  Skin is warm and dry. There are no suspicious lesions or rash HEAD: Normal in size and contour. No evidence of trauma EYES: Lids open and close normally. No blepharitis, entropion or ectropion. PERRL. Conjunctivae are clear and sclerae are white. Lenses are without opacity EARS: Pinnae are normal. Patient hears normal voice tunes of the examiner MOUTH and THROAT: Lips are without lesions. Oral mucosa is moist and without lesions. Tongue is normal in shape, size, and color and without lesions NECK: supple, trachea midline, no neck masses, no thyroid tenderness, no thyromegaly LYMPHATICS: No LAN in the neck, no supraclavicular LAN RESPIRATORY: Breathing is even & unlabored, BS CTAB CARDIAC: RRR, no murmur,no extra heart sounds, no edema GI: Abdomen soft, normal BS, no masses, no tenderness, no hepatomegaly, no splenomegaly MUSCULOSKELETAL: No deformities. Movement at each extremity is full and painless. Strength is 5/5 at each extremity. Back is without kyphosis or scoliosis CIRCULATION: Pedal pulses are 2+. There is no edema of the legs, ankles and feet NEUROLOGICAL: There is no tremor. Speech is clear PSYCHIATRIC: Alert and oriented X 3. Affect and behavior are appropriate  Labs reviewed: Recent Labs    01/10/20 0337 01/11/20 0256 01/12/20 0308  NA 137 137 137  K 4.2 4.0 4.0  CL 104 104 105  CO2 27 23 23   GLUCOSE 111* 118* 143*  BUN 23 23 25*  CREATININE 1.39* 1.35*  1.29*  CALCIUM 8.3* 7.9* 8.4*   Recent Labs    01/07/20 1814 01/08/20 0540 01/12/20 0308  AST 20  --  24  ALT 24  --  21  ALKPHOS 74  --  76  BILITOT 1.9* 1.8* 0.9  PROT 6.3*  --  6.0*  ALBUMIN 3.2*  --  2.6*   Recent Labs    01/07/20 1814 01/07/20 1835 01/10/20 0337  WBC 10.4  --  8.0  NEUTROABS 7.8*  --   --   HGB 14.8 14.6 14.2  HCT 45.4 43.0 41.9  MCV 93.0  --  90.3  PLT 151  --  142*   No results found for: TSH Lab Results  Component Value Date   HGBA1C 5.5 01/09/2020   Lab Results  Component Value Date   CHOL 169 01/09/2020   HDL 34 (L) 01/09/2020   LDLCALC 119 (H) 01/09/2020   TRIG 80 01/09/2020   CHOLHDL 5.0 01/09/2020    Significant Diagnostic Results in last 30 days:  DG Foot 2 Views Left  Result Date: 01/08/2020 CLINICAL DATA:  Localized swelling and redness of the left foot new onset left-sided weakness and paralysis. EXAM: LEFT FOOT - 2 VIEW COMPARISON:  None. FINDINGS: No acute fracture or  traumatic malalignment is evident. There is focal severe arthrosis at the first metatarsophalangeal joint with extensive sclerotic features and bony remodeling. Periarticular osteophyte formations are noted as well as mild soft tissue swelling. Additional degenerative changes throughout the left foot are more mild. Questionable lucency along the anterolateral corner of the cuboid could reflect a small marginal erosion. 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 Recommend correlation with mobility to assess for hallux limitus. Questionable erosion along the anterolateral corner of the cuboid, could reflect some underlying arthropathy. Correlation with exam findings and serologies could be considered. Electronically Signed   By: Kreg Shropshire M.D.   On: 01/08/2020  19:01   VAS [place] TRANSCRANIAL DOPPLER W BUBBLES  Result Date: 01/12/2020  Transcranial Doppler with Bubble Indications: Stroke. Performing Technologist: Marilynne Halsted RDMS, RVT  Examination Guidelines: A complete evaluation includes B-mode imaging, spectral Doppler, color Doppler, and power Doppler as needed of all accessible portions of each vessel. Bilateral testing is considered an integral part of a complete examination. Limited examinations for reoccurring indications may be performed as noted.  Summary:  A vascular evaluation was performed. The left middle cerebral artery was studied. An IV was inserted into the patient's right forearm. Verbal informed consent was obtained.  Dr. Roda Shutters performed. No HITS at rest or during Valsalva. No apparent PFO. Negative transcranial Doppler bubble study. *See table(s) above for TCD measurements and observations.  Diagnosing physician: Delia Heady MD Electronically signed by Delia Heady MD on 01/12/2020 at 11:33:23 AM.    Final    VAS US CAROTID  Result Date: 01/10/2020 Carotid Arterial Duplex Study Indications:       CVA. Comparison Study:  No prior study Performing Technologist: Gertie Fey MHA, RDMS, RVT, RDCS  Examination Guidelines: A complete evaluation includes B-mode imaging, spectral Doppler, color Doppler, and power Doppler as needed of all accessible portions of each vessel. Bilateral testing is considered an integral part of a complete examination. Limited examinations for reoccurring indications may be performed as noted.  Right Carotid Findings: +----------+--------+-------+--------+----------------------+------------------+           PSV cm/sEDV    StenosisPlaque Description    Comments                             cm/s                                                    +----------+--------+-------+--------+----------------------+------------------+ CCA Prox  101     11                                   intimal thickening  +----------+--------+-------+--------+----------------------+------------------+ CCA Distal75      9              smooth and  heterogenous                             +----------+--------+-------+--------+----------------------+------------------+ ICA Prox  44      11             smooth and                                                                heterogenous                             +----------+--------+-------+--------+----------------------+------------------+ Unable to visualize distal ICA, ECA, vertebral artery, subclavian artery. Left Carotid Findings: +----------+--------+--------+--------+-----------------------+--------+           PSV cm/sEDV cm/sStenosisPlaque Description     Comments +----------+--------+--------+--------+-----------------------+--------+ CCA Prox  170     16                                              +----------+--------+--------+--------+-----------------------+--------+ CCA Distal78      13              smooth and heterogenous         +----------+--------+--------+--------+-----------------------+--------+ ICA Prox  42      7                                               +----------+--------+--------+--------+-----------------------+--------+ ICA Distal59      12                                              +----------+--------+--------+--------+-----------------------+--------+ ECA       124     6                                               +----------+--------+--------+--------+-----------------------+--------+ +----------+--------+--------+----------------+-------------------+           PSV cm/sEDV cm/sDescribe        Arm Pressure (mmHG) +----------+--------+--------+----------------+-------------------+ [id number]              Multiphasic, WNL                     +----------+--------+--------+----------------+-------------------+ +---------+--------+--+--------+-+---------+ VertebralPSV cm/s18EDV cm/s4Antegrade +---------+--------+--+--------+-+---------+  Summary: Right Carotid: Velocities in the right ICA are consistent with a 1-39% stenosis. Left Carotid: Velocities in the left ICA are consistent with a 1-39% stenosis. Vertebrals:  Left vertebral artery demonstrates antegrade flow. Unable to              visualize right vertebral artery. Subclavians: Normal flow hemodynamics were seen in the left subclavian artery.              Unable to visualize the right subclavian artery. *See table(s) above for measurements and observations.  Electronically signed by Delia Heady MD on 01/10/2020 at 8:27:56 AM.    Final    VAS [place] LOWER EXTREMITY VENOUS (DVT)  Result Date: 01/09/2020  Lower Venous Study Indications: Stroke.  Comparison Study: No prior study. Performing Technologist: Gertie Fey MHA, RDMS, RVT, RDCS  Examination Guidelines: A complete evaluation includes B-mode imaging, spectral Doppler, color Doppler, and power Doppler as needed of all accessible portions of each vessel. Bilateral testing is considered an integral part of a complete examination. Limited examinations for reoccurring indications may be performed as noted.  +---------+---------------+---------+-----------+----------+--------------+ RIGHT    CompressibilityPhasicitySpontaneityPropertiesThrombus Aging +---------+---------------+---------+-----------+----------+--------------+ CFV      Full           Yes      Yes                                 +---------+---------------+---------+-----------+----------+--------------+ SFJ      Full                                                        +---------+---------------+---------+-----------+----------+--------------+ FV Prox  Full                                                         +---------+---------------+---------+-----------+----------+--------------+ FV Mid   Full                                                        +---------+---------------+---------+-----------+----------+--------------+ FV DistalFull                                                        +---------+---------------+---------+-----------+----------+--------------+ PFV      Full                                                        +---------+---------------+---------+-----------+----------+--------------+ POP      Full           Yes      Yes                                 +---------+---------------+---------+-----------+----------+--------------+ PTV      None                    No                                  +---------+---------------+---------+-----------+----------+--------------+   Right Technical Findings: Not visualized segments include peroneal veins.  +---------+---------------+---------+-----------+----------+--------------+ LEFT  CompressibilityPhasicitySpontaneityPropertiesThrombus Aging +---------+---------------+---------+-----------+----------+--------------+ CFV      Full           Yes      Yes                                 +---------+---------------+---------+-----------+----------+--------------+ SFJ      Full                                                        +---------+---------------+---------+-----------+----------+--------------+ FV Prox  Full                                                        +---------+---------------+---------+-----------+----------+--------------+ FV Mid   Full                                                        +---------+---------------+---------+-----------+----------+--------------+ FV DistalFull                                                        +---------+---------------+---------+-----------+----------+--------------+ PFV      Full                                                         +---------+---------------+---------+-----------+----------+--------------+ POP      Full           Yes      Yes                                 +---------+---------------+---------+-----------+----------+--------------+ PTV      Full                                                        +---------+---------------+---------+-----------+----------+--------------+ PERO     Full                                                        +---------+---------------+---------+-----------+----------+--------------+     Summary: Right: Findings consistent with acute deep vein thrombosis involving the right posterior tibial veins. No cystic structure found in the popliteal fossa. Left: There is no evidence of deep vein thrombosis in the lower extremity. No cystic structure found in the popliteal fossa.  *See table(s) above  for measurements and observations. Electronically signed by Coral Else MD on 01/09/2020 at 8:31:35 PM.    Final     Assessment/Plan    Family/ staff Communication:   Labs/tests ordered:    Goals of care:      Kenard Gower, DNP, FNP-BC [facility] and Adult Medicine [phone] (Monday-Friday 8:00 a.m. - 5:00 p.m.) [phone] (after hours)

## 2020-02-15 ENCOUNTER — Encounter: Payer: Self-pay | Admitting: Adult Health

## 2020-02-15 ENCOUNTER — Non-Acute Institutional Stay (SKILLED_NURSING_FACILITY): Payer: Medicare Other | Admitting: Adult Health

## 2020-02-15 DIAGNOSIS — U071 COVID-19: Secondary | ICD-10-CM

## 2020-02-15 DIAGNOSIS — F0280 Dementia in other diseases classified elsewhere without behavioral disturbance: Secondary | ICD-10-CM

## 2020-02-15 DIAGNOSIS — I824Z9 Acute embolism and thrombosis of unspecified deep veins of unspecified distal lower extremity: Secondary | ICD-10-CM | POA: Diagnosis not present

## 2020-02-15 DIAGNOSIS — G301 Alzheimer's disease with late onset: Secondary | ICD-10-CM | POA: Diagnosis not present

## 2020-02-15 DIAGNOSIS — I6930 Unspecified sequelae of cerebral infarction: Secondary | ICD-10-CM | POA: Diagnosis not present

## 2020-02-15 NOTE — Progress Notes (Signed)
Location:  [facility] Room Number: 103/A Place of Service:  SNF (31) Provider:  Durenda Age, DNP, FNP-BC  Patient Care Team: Lorene Dy, MD as PCP - General (Internal Medicine)  Extended Emergency Contact Information Primary Emergency Contact: Journey, Ratterman Address: [place] [address] [name] Phone: [phone] Mobile Phone: [phone] Relation: Spouse Secondary Emergency Contact: Keymarion, [place] Mobile Phone: [phone] Relation: Son  Code Status:  DNR  Goals of care: Advanced Directive information Advanced Directives 02/15/2020  Does Patient Have a Medical Advance Directive? Yes  Type of Paramedic of [place];Out of facility DNR (pink MOST or yellow form)  Does patient want to make changes to medical advance directive? No - Patient declined  Copy of [place] in Chart? Yes - validated most recent copy scanned in chart (See row information)  Pre-existing out of facility DNR order (yellow form or pink MOST form) Yellow form placed in chart (order not valid for inpatient use)     Chief Complaint  Patient presents with  . Medical Management of Chronic Issues    Routine visit of medical management    HPI:  Pt is a 84 y.o. male seen today for medical management of chronic diseases. He has PMH of advanced Alzheimer's dementia. He was recently diagnosed with COVID-19 and was treated with Azithromycin, Zinc sulfate, Florastor and guaifenesin X 5 days. He tested positive for COVID-19 Ag. No reported fever nor SOB.        He has been admitted to [facility] on 01/15/20 post hospitalization 01/07/20 to 01/14/20 for cerebral ischemic stroke.    Past Medical History:  Diagnosis Date  . Alzheimer disease ([place]) 09/25/2019   Past Surgical History:  Procedure Laterality Date  . APPENDECTOMY    . BACK SURGERY    . CHOLECYSTECTOMY     . HERNIA REPAIR      Allergies  Allergen Reactions  . Demerol [Meperidine] Nausea And Vomiting    Outpatient Encounter Medications as of 02/15/2020  Medication Sig  . aspirin EC 81 MG EC tablet Take 1 tablet (81 mg total) by mouth daily.  Marland Kitchen atorvastatin (LIPITOR) 40 MG tablet Take 1 tablet (40 mg total) by mouth daily at 6 PM.  . magnesium hydroxide (MILK OF MAGNESIA) 400 MG/5ML suspension If no BM in 3 days, give 30 cc Milk of Magnesium p.o. x 1 dose in 24 hours as needed (Do not use standing constipation orders for residents with renal failure CFR less than 30. Contact MD for orders) (Physician Order)  . memantine (NAMENDA) 10 MG tablet Take 10 mg by mouth 2 (two) times daily.  . NON FORMULARY MEDPASS 120 ML TWO TIMES A DAY  . rivastigmine (EXELON) 4.6 mg/24hr Place 1 patch (4.6 mg total) onto the skin daily.  Marland Kitchen senna-docusate (SENOKOT-S) 8.6-50 MG tablet Take 1 tablet by mouth at bedtime as needed for mild constipation.   No facility-administered encounter medications on file as of 02/15/2020.    Review of Systems  Unable to obtain due to dementia    Immunization History  Administered Date(s) Administered  . PPD Test 01/09/2020   Pertinent  Health Maintenance Due  Topic Date Due  . PNA vac Low Risk Adult (1 of 2 - PCV13) 01/04/2003  . INFLUENZA VACCINE  07/28/2019   No flowsheet data found.   Vitals:   02/15/20 1318  BP: 134/80  Pulse: 80  Resp: 18  Temp: (!) 97.1 F (36.2 C)  TempSrc: Oral  Weight: 179 lb 3.2 oz (81.3 kg)  Height: 6' (1.829 m)   Body mass index is 24.3 kg/m.  Physical Exam  GENERAL APPEARANCE: Well nourished. In no acute distress. Normal body habitus SKIN:  Skin is warm and dry.  MOUTH and THROAT: Lips are without lesions. Oral mucosa is moist and without lesions.  RESPIRATORY: Breathing is even & unlabored, BS CTAB CARDIAC: RRR, no murmur,no extra heart sounds, no edema GI: Abdomen soft, normal BS, no masses, no tenderness NEUROLOGICAL:  There is no tremor. Speech is clear. Left-sided weakness. Disoriented X 3 PSYCHIATRIC:  Affect and behavior are appropriate  Labs reviewed: Recent Labs    01/10/20 0337 01/11/20 0256 01/12/20 0308  NA 137 137 137  K 4.2 4.0 4.0  CL 104 104 105  CO2 27 23 23   GLUCOSE 111* 118* 143*  BUN 23 23 25*  CREATININE 1.39* 1.35* 1.29*  CALCIUM 8.3* 7.9* 8.4*   Recent Labs    01/07/20 1814 01/08/20 0540 01/12/20 0308  AST 20  --  24  ALT 24  --  21  ALKPHOS 74  --  76  BILITOT 1.9* 1.8* 0.9  PROT 6.3*  --  6.0*  ALBUMIN 3.2*  --  2.6*   Recent Labs    01/07/20 1814 01/07/20 1835 01/10/20 0337  WBC 10.4  --  8.0  NEUTROABS 7.8*  --   --   HGB 14.8 14.6 14.2  HCT 45.4 43.0 41.9  MCV 93.0  --  90.3  PLT 151  --  142*    Lab Results  Component Value Date   HGBA1C 5.5 01/09/2020   Lab Results  Component Value Date   CHOL 169 01/09/2020   HDL 34 (L) 01/09/2020   LDLCALC 119 (H) 01/09/2020   TRIG 80 01/09/2020   CHOLHDL 5.0 01/09/2020     Assessment/Plan  1. Late effects of cerebral ischemic stroke -  Stable, continue Aspirin EC 81 mg daily and Atorvastatin  2. COVID-19 - completed treatment, resolved - no fever, cough nor SOB  3. Deep vein thrombosis (DVT) of distal vein of lower extremity, unspecified chronicity, unspecified laterality (HCC) - repeat venous doppler study to RLE was negative for DVT  4. Late onset Alzheimer's disease without behavioral disturbance (HCC) -  Continue Memantine and Rivastigmine, supportive care and fall precautions     Family/ staff Communication: Discussed plan of care with charge nurse.  Labs/tests ordered:  None  Goals of care:  Short-term care    01/11/2020, DNP, FNP-BC [facility] and Adult Medicine [phone] (Monday-Friday 8:00 a.m. - 5:00 p.m.) [phone] (after hours)

## 2020-02-19 ENCOUNTER — Encounter: Payer: Self-pay | Admitting: Adult Health

## 2020-02-19 ENCOUNTER — Non-Acute Institutional Stay (SKILLED_NURSING_FACILITY): Payer: Medicare Other | Admitting: Adult Health

## 2020-02-19 DIAGNOSIS — I824Z9 Acute embolism and thrombosis of unspecified deep veins of unspecified distal lower extremity: Secondary | ICD-10-CM

## 2020-02-19 DIAGNOSIS — G301 Alzheimer's disease with late onset: Secondary | ICD-10-CM | POA: Diagnosis not present

## 2020-02-19 DIAGNOSIS — U071 COVID-19: Secondary | ICD-10-CM | POA: Diagnosis not present

## 2020-02-19 DIAGNOSIS — I6930 Unspecified sequelae of cerebral infarction: Secondary | ICD-10-CM

## 2020-02-19 DIAGNOSIS — F0280 Dementia in other diseases classified elsewhere without behavioral disturbance: Secondary | ICD-10-CM

## 2020-02-19 NOTE — Progress Notes (Addendum)
Location:  [facility] Room Number: 103/A Place of Service:  SNF (31) Provider:  Kenard Gower, DNP, FNP-BC  Patient Care Team: Burton Apley, MD as PCP - General (Internal Medicine)  Extended Emergency Contact Information Primary Emergency Contact: Vincen, Bejar Address: [address]          [place] [address] [place] Home Phone: [phone] Mobile Phone: [phone] Relation: Spouse Secondary Emergency Contact: Wayden, [place]ert[place] Mobile Phone: [phone] Relation: Son  Code Status:  DNR  Goals of care: Advanced Directive information Advanced Directives 03/05/2020  Does Patient Have a Medical Advance Directive? Yes  Type of Advance Directive Out of facility DNR (pink MOST or yellow form);Healthcare Power of Attorney  Does patient want to make changes to medical advance directive? No - Patient declined  Copy of Healthcare Power of Attorney in Chart? Yes - validated most recent copy scanned in chart (See row information)  Pre-existing out of facility DNR order (yellow form or pink MOST form) Yellow form placed in chart (order not valid for inpatient use)     Chief Complaint  Patient presents with  . Acute Visit    Possible Discharge (Transfer to [facility])    HPI:  Pt is an 84 y.o. male who was supposed to transfer to [facility]. However, family decided to keep resident at [facility].  He has been admitted to [facility] on 01/15/20 post hospitalization 01/07/20 to 01/14/20. He has PMH of advanced Alzheimer's dementia. He has been having left-sided weakness, left arm and leg,  A wek prior to presenting to the hospital. When he was trying to walk using a walker, he slid down with no sustained injury and did not hit his head.  Head CT was negative for acute intracranial hemorrhage but multifocal hypodensities with loss of gray-white matter discrimination.  Brain MRI showed  acute right ACA territory infarcts with mild petechial hemorrhage but no malignant hemorrhagic formation or mass-effect.  Chronic left ACA territory infarct.  He was given 500 cc normal saline bolus.  Neurology was consulted but he was seen outside the window period for thrombolysis or endovascular thrombectomy.  He was a started on dual antiplatelets for 3 weeks then aspirin alone  MR angiogram of the head showed occlusion of the right A2 segment corresponding to right anterior cerebral artery infarct.  2D echocardiogram performed on 01/08/2020 showed LV ejection fraction of 55 to 60%.  Carotid Doppler ultrasound without significant stenosis and lower extremity vascular duplex ultrasound showed right distal (posterior tibial veins) DVT.    Repeat venous doppler ultrasound done on RLE on 01/16/20 and 02/07/20 were negative for DVT. He tested positive for COVID-19 Ag on 01/31/20 while at [facility]. He was treated with Azithromycin, Zinc sulfate, Florastor and guaifenesin. He has completed treatments for COVID-19 and now resolved.   Past Medical History:  Diagnosis Date  . Alzheimer disease (HCC) 09/25/2019   Past Surgical History:  Procedure Laterality Date  . APPENDECTOMY    . BACK SURGERY    . CHOLECYSTECTOMY    . HERNIA REPAIR      Allergies  Allergen Reactions  . Demerol [Meperidine] Nausea And Vomiting    Outpatient Encounter Medications as of 02/19/2020  Medication Sig  . aspirin EC 81 MG EC tablet Take 1 tablet (81 mg total) by mouth daily.  Marland Kitchen atorvastatin (LIPITOR) 40 MG tablet Take 1 tablet (40 mg total) by mouth daily at 6 PM.  . magnesium hydroxide (  MILK OF MAGNESIA) 400 MG/5ML suspension If no BM in 3 days, give 30 cc Milk of Magnesium p.o. x 1 dose in 24 hours as needed (Do not use standing constipation orders for residents with renal failure CFR less than 30. Contact MD for orders) (Physician Order)  . memantine (NAMENDA) 10 MG tablet Take 10 mg by mouth 2 (two) times daily.  .  NON FORMULARY Take 120 mLs by mouth in the morning and at bedtime. MEDPASS  . rivastigmine (EXELON) 4.6 mg/24hr Place 1 patch (4.6 mg total) onto the skin daily.  Marland Kitchen senna-docusate (SENOKOT-S) 8.6-50 MG tablet Take 1 tablet by mouth at bedtime as needed for mild constipation.   No facility-administered encounter medications on file as of 02/19/2020.    Review of Systems   Unable to obtain due to dementia    Immunization History  Administered Date(s) Administered  . Influenza-Unspecified 09/27/2019  . PPD Test 01/09/2020   Pertinent  Health Maintenance Due  Topic Date Due  . PNA vac Low Risk Adult (1 of 2 - PCV13) 02/24/2021 (Originally 01/04/2003)  . INFLUENZA VACCINE  Completed   No flowsheet data found.   Vitals:   02/19/20 1014  BP: 109/66  Pulse: (!) 58  Resp: 18  Temp: 97.9 F (36.6 C)  TempSrc: Oral  Weight: 179 lb 3.2 oz (81.3 kg)  Height: 6' (1.829 m)   Body mass index is 24.3 kg/m.  Physical Exam  GENERAL APPEARANCE: Well nourished. In no acute distress. Normal body habitus SKIN:  Skin is warm and dry.  MOUTH and THROAT: Lips are without lesions. Oral mucosa is moist and without lesions.  RESPIRATORY: Breathing is even & unlabored, BS CTAB CARDIAC: RRR, no murmur,no extra heart sounds, no edema GI: Abdomen soft, normal BS, no masses, no tenderness NEUROLOGICAL: There is no tremor. Speech is clear. Has left-sided weakness.  PSYCHIATRIC:  Affect and behavior are appropriate  Labs reviewed: Recent Labs    01/10/20 0337 01/11/20 0256 01/12/20 0308  NA 137 137 137  K 4.2 4.0 4.0  CL 104 104 105  CO2 27 23 23   GLUCOSE 111* 118* 143*  BUN 23 23 25*  CREATININE 1.39* 1.35* 1.29*  CALCIUM 8.3* 7.9* 8.4*   Recent Labs    01/07/20 1814 01/08/20 0540 01/12/20 0308  AST 20  --  24  ALT 24  --  21  ALKPHOS 74  --  76  BILITOT 1.9* 1.8* 0.9  PROT 6.3*  --  6.0*  ALBUMIN 3.2*  --  2.6*   Recent Labs    01/07/20 1814 01/07/20 1835 01/10/20 0337    WBC 10.4  --  8.0  NEUTROABS 7.8*  --   --   HGB 14.8 14.6 14.2  HCT 45.4 43.0 41.9  MCV 93.0  --  90.3  PLT 151  --  142*   No results found for: TSH Lab Results  Component Value Date   HGBA1C 5.5 01/09/2020   Lab Results  Component Value Date   CHOL 169 01/09/2020   HDL 34 (L) 01/09/2020   LDLCALC 119 (H) 01/09/2020   TRIG 80 01/09/2020   CHOLHDL 5.0 01/09/2020     Assessment/Plan  1. Late effects of cerebral ischemic stroke -  Stable, continue aspirin EC 81 mg daily and atorvastatin  2. Deep vein thrombosis (DVT) of distal vein of lower extremity, unspecified chronicity, unspecified laterality (HCC) -  Repeat venous doppler ultrasound done on RLE on 01/16/20 and 02/07/20 were negative for DVT  3. COVID-19 -Completed treatment, resolved  4. Late onset Alzheimer's disease without behavioral disturbance (HCC) -Continue memantine and rivastigmine, supportive care and fall precautions      Family staff communication: Discussed plan of care with resident and charge nurse.   Labs/tests ordered: None   Goals of care:  Short-term care    Kenard Gower, DNP, FNP-BC [facility] and Adult Medicine [phone] (Monday-Friday 8:00 a.m. - 5:00 p.m.) [phone] (after hours)

## 2020-03-05 ENCOUNTER — Non-Acute Institutional Stay (SKILLED_NURSING_FACILITY): Payer: Medicare Other | Admitting: Adult Health

## 2020-03-05 ENCOUNTER — Encounter: Payer: Self-pay | Admitting: Adult Health

## 2020-03-05 DIAGNOSIS — F0280 Dementia in other diseases classified elsewhere without behavioral disturbance: Secondary | ICD-10-CM | POA: Diagnosis not present

## 2020-03-05 DIAGNOSIS — G301 Alzheimer's disease with late onset: Secondary | ICD-10-CM

## 2020-03-05 DIAGNOSIS — I6930 Unspecified sequelae of cerebral infarction: Secondary | ICD-10-CM | POA: Diagnosis not present

## 2020-03-05 DIAGNOSIS — E782 Mixed hyperlipidemia: Secondary | ICD-10-CM

## 2020-03-05 NOTE — Progress Notes (Signed)
Location:  [facility] Room Number: 103-A Place of Service:  SNF (31) Provider:  Kenard Gower, DNP, FNP-BC  Patient Care Team: Burton Apley, MD as PCP - General (Internal Medicine)  Extended Emergency Contact Information Primary Emergency Contact: Ned, Kakar Address: [address]          [place] [address] [place] of [place] Home Phone: [phone] Mobile Phone: [phone] Relation: Spouse Secondary Emergency Contact: Shneur, [place] Mobile Phone: [phone] Relation: Son  Code Status:  DNR  Goals of care: Advanced Directive information Advanced Directives 03/05/2020  Does Patient Have a Medical Advance Directive? Yes  Type of Advance Directive Out of facility DNR (pink MOST or yellow form);Healthcare Power of Attorney  Does patient want to make changes to medical advance directive? No - Patient declined  Copy of Healthcare Power of Attorney in Chart? Yes - validated most recent copy scanned in chart (See row information)  Pre-existing out of facility DNR order (yellow form or pink MOST form) Yellow form placed in chart (order not valid for inpatient use)     Chief Complaint  Patient presents with  . Medical Management of Chronic Issues    Routine [facility] visit    HPI:  Pt is an 84 y.o. male seen today for medical management of chronic diseases.  He is a short-term rehabilitation resident at [facility]. He has a PMH of advanced dementia. He was supposed to be transferred to another facility but decided to keep him at the facility for now. Wife comes to facility for compassionate visits every Tuesdays. He participates in restorative feeding. He requires maximal cuing and set up when eating. He tested positive for COVID-19 Ag on 01/31/20 and completed treatments. No reported fever, cough nor SOB.  He was seen in the room today holding a stuff toy.   He has been admitted to [facility] and  Re[facility] on 01/15/20 post hospitalization 01/07/20 to 01/14/20. He has been having left-sided weakness at home, left arm and leg, a week prior to his hospitalization. When he tried to walk using a walker, he slid down with no sustained injury and did not hit his head. Head CT was negative for acute intracranial hemorrhage but multifocal hypodensities with loss of gray-white matter discrimination. Brain MRI showed acute right ACA territory infarcts with mild petechial hemorrhage but no malignant hemorrhagic formation or mass effect. Chronic left ACA territory infarct. Neurology was consulted but he was seen outside the window period for thrombolysis or endovascular thrombectomy. He was started on dual antiplatelets for 3 weeks then aspirin alone. Lower extremity duplex ultrasound showed right distal (posterior tibial veins) DVT.  Repeat venous doppler ultrasounds of RLE on  01/16/20 and 02/07/20 were negative for DVT.   Past Medical History:  Diagnosis Date  . Alzheimer disease (HCC) 09/25/2019   Past Surgical History:  Procedure Laterality Date  . APPENDECTOMY    . BACK SURGERY    . CHOLECYSTECTOMY    . HERNIA REPAIR      Allergies  Allergen Reactions  . Demerol [Meperidine] Nausea And Vomiting    Outpatient Encounter Medications as of 03/05/2020  Medication Sig  . aspirin EC 81 MG EC tablet Take 1 tablet (81 mg total) by mouth daily.  Marland Kitchen atorvastatin (LIPITOR) 40 MG tablet Take 1 tablet (40 mg total) by mouth daily at 6 PM.  . magnesium hydroxide (MILK OF MAGNESIA) 400 MG/5ML suspension If no BM in 3 days, give 30 cc Milk of  Magnesium p.o. x 1 dose in 24 hours as needed (Do not use standing constipation orders for residents with renal failure CFR less than 30. Contact MD for orders) (Physician Order)  . memantine (NAMENDA) 10 MG tablet Take 10 mg by mouth 2 (two) times daily.  . NON FORMULARY Take 120 mLs by mouth in the morning and at bedtime. MEDPASS  . rivastigmine (EXELON) 4.6 mg/24hr  Place 1 patch (4.6 mg total) onto the skin daily.  Marland Kitchen senna-docusate (SENOKOT-S) 8.6-50 MG tablet Take 1 tablet by mouth at bedtime as needed for mild constipation.   No facility-administered encounter medications on file as of 03/05/2020.    Review of Systems  Unable to obtain due to dementia   Immunization History  Administered Date(s) Administered  . Influenza-Unspecified 09/27/2019  . PPD Test 01/09/2020   Pertinent  Health Maintenance Due  Topic Date Due  . PNA vac Low Risk Adult (1 of 2 - PCV13) 02/24/2021 (Originally 01/04/2003)  . INFLUENZA VACCINE  Completed    Vitals:   03/05/20 1007  BP: (!) 147/88  Pulse: 82  Resp: 20  Temp: 98 F (36.7 C)  TempSrc: Oral  Weight: 179 lb 6.4 oz (81.4 kg)  Height: 6' (1.829 m)   Body mass index is 24.33 kg/m.  Physical Exam  GENERAL APPEARANCE: Well nourished. In no acute distress. Normal body habitus SKIN:  Skin is warm and dry.  MOUTH and THROAT: Lips are without lesions. Oral mucosa is moist and without lesions.  RESPIRATORY: Breathing is even & unlabored, BS CTAB CARDIAC: RRR, no murmur,no extra heart sounds, no edema GI: Abdomen soft, normal BS, no masses, no tenderness NEUROLOGICAL: There is no tremor. Speech is clear. Confused X 3. Left-sided weakness. PSYCHIATRIC:  Affect and behavior are appropriate  Labs reviewed: Recent Labs    01/10/20 0337 01/11/20 0256 01/12/20 0308  NA 137 137 137  K 4.2 4.0 4.0  CL 104 104 105  CO2 27 23 23   GLUCOSE 111* 118* 143*  BUN 23 23 25*  CREATININE 1.39* 1.35* 1.29*  CALCIUM 8.3* 7.9* 8.4*   Recent Labs    01/07/20 1814 01/08/20 0540 01/12/20 0308  AST 20  --  24  ALT 24  --  21  ALKPHOS 74  --  76  BILITOT 1.9* 1.8* 0.9  PROT 6.3*  --  6.0*  ALBUMIN 3.2*  --  2.6*   Recent Labs    01/07/20 1814 01/07/20 1835 01/10/20 0337  WBC 10.4  --  8.0  NEUTROABS 7.8*  --   --   HGB 14.8 14.6 14.2  HCT 45.4 43.0 41.9  MCV 93.0  --  90.3  PLT 151  --  142*    Lab  Results  Component Value Date   HGBA1C 5.5 01/09/2020   Lab Results  Component Value Date   CHOL 169 01/09/2020   HDL 34 (L) 01/09/2020   LDLCALC 119 (H) 01/09/2020   TRIG 80 01/09/2020   CHOLHDL 5.0 01/09/2020    Assessment/Plan  1. Mixed hyperlipidemia Lab Results  Component Value Date   CHOL 169 01/09/2020   HDL 34 (L) 01/09/2020   LDLCALC 119 (H) 01/09/2020   TRIG 80 01/09/2020   CHOLHDL 5.0 01/09/2020   - continue atorvastatin  2. Late effects of cerebral ischemic stroke - has left sided weakness, fall precautions -Continue aspirin and atorvastatin  3. Late onset Alzheimer's disease without behavioral disturbance (HCC) -Continue memantine and rivastigmine -Continue supportive care   Family/ staff  Communication: Discussed plan of care with resident.   Labs/tests ordered: None   Goals of care:   Short-term care   Durenda Age, DNP, FNP-BC [facility] and Adult Medicine [phone] (Monday-Friday 8:00 a.m. - 5:00 p.m.) [phone] (after hours)

## 2020-03-06 NOTE — Addendum Note (Signed)
Addended by: Kenard Gower C on: 03/06/2020 03:19 PM   Modules accepted: Level of Service

## 2020-03-12 ENCOUNTER — Non-Acute Institutional Stay (SKILLED_NURSING_FACILITY): Payer: Medicare Other | Admitting: Adult Health

## 2020-03-12 ENCOUNTER — Encounter: Payer: Self-pay | Admitting: Adult Health

## 2020-03-12 DIAGNOSIS — I6930 Unspecified sequelae of cerebral infarction: Secondary | ICD-10-CM

## 2020-03-12 DIAGNOSIS — I69391 Dysphagia following cerebral infarction: Secondary | ICD-10-CM

## 2020-03-12 DIAGNOSIS — Z7189 Other specified counseling: Secondary | ICD-10-CM

## 2020-03-12 DIAGNOSIS — G301 Alzheimer's disease with late onset: Secondary | ICD-10-CM

## 2020-03-12 DIAGNOSIS — F0280 Dementia in other diseases classified elsewhere without behavioral disturbance: Secondary | ICD-10-CM

## 2020-03-12 NOTE — Progress Notes (Signed)
Location:  [facility] Room Number: 103-A Place of Service:  SNF (31) Provider:  Kenard Gower, DNP, FNP-BC  Patient Care Team: Burton Apley, MD as PCP - General (Internal Medicine)  Extended Emergency Contact Information Primary Emergency Contact: Webster, Patrone Address: [address]          [place] [address] [place] of [place] Home Phone: [phone] Mobile Phone: [phone] Relation: Spouse Secondary Emergency Contact: Ibn, Stief Mobile Phone: [phone] Relation: Son  Code Status:  DNR  Goals of care: Advanced Directive information Advanced Directives 03/12/2020  Does Patient Have a Medical Advance Directive? Yes  Type of Advance Directive Out of facility DNR (pink MOST or yellow form);Healthcare Power of Attorney  Does patient want to make changes to medical advance directive? No - Patient declined  Copy of Healthcare Power of Attorney in Chart? Yes - validated most recent copy scanned in chart (See row information)  Pre-existing out of facility DNR order (yellow form or pink MOST form) Yellow form placed in chart (order not valid for inpatient use)     Chief Complaint  Patient presents with  . Acute Visit    Advanced care plan meeting    HPI:  Pt is an 84 y.o. male who had an advance care planning today attended by NP, social worker, MDS coordinator, dietician and son, Kyle [name], who attended via telephone conference. He remains to have code status of DNR. Discussed medications, vital signs and weights. At present, he is having short-term rehabilitation, PT, OT and ST. He is being fed by staff during meals. Discussed with son to discussed with family members if they would want a feeding tube whenever resident stops eating. He currently has scoop mattress to prevent him from falling out of bed. He has PMH of advanced Alzheimer's dementia. The meeting lasted for 25 minutes   Past Medical History:  Diagnosis Date  .  Alzheimer disease (HCC) 09/25/2019   Past Surgical History:  Procedure Laterality Date  . APPENDECTOMY    . BACK SURGERY    . CHOLECYSTECTOMY    . HERNIA REPAIR      Allergies  Allergen Reactions  . Demerol [Meperidine] Nausea And Vomiting    Outpatient Encounter Medications as of 03/12/2020  Medication Sig  . acetaminophen (TYLENOL) 325 MG tablet Take 650 mg by mouth every 6 (six) hours as needed.  Marland Kitchen aspirin EC 81 MG EC tablet Take 1 tablet (81 mg total) by mouth daily.  Marland Kitchen atorvastatin (LIPITOR) 40 MG tablet Take 1 tablet (40 mg total) by mouth daily at 6 PM.  . magnesium hydroxide (MILK OF MAGNESIA) 400 MG/5ML suspension If no BM in 3 days, give 30 cc Milk of Magnesium p.o. x 1 dose in 24 hours as needed (Do not use standing constipation orders for residents with renal failure CFR less than 30. Contact MD for orders) (Physician Order)  . memantine (NAMENDA) 10 MG tablet Take 10 mg by mouth 2 (two) times daily.  . Nutritional Supplement LIQD Take 120 mLs by mouth 2 (two) times daily.  . rivastigmine (EXELON) 4.6 mg/24hr Place 1 patch (4.6 mg total) onto the skin daily.  Marland Kitchen senna-docusate (SENOKOT-S) 8.6-50 MG tablet Take 1 tablet by mouth at bedtime as needed for mild constipation.  . [DISCONTINUED] NON FORMULARY Take 120 mLs by mouth in the morning and at bedtime. MEDPASS   No facility-administered encounter medications on file as of 03/12/2020.    Review of Systems  Unable to obtain due  to dementia    Immunization History  Administered Date(s) Administered  . Influenza-Unspecified 09/27/2019  . PPD Test 01/09/2020   Pertinent  Health Maintenance Due  Topic Date Due  . PNA vac Low Risk Adult (1 of 2 - PCV13) 02/24/2021 (Originally 01/04/2003)  . INFLUENZA VACCINE  Completed    Vitals:   03/12/20 0940  BP: 134/81  Pulse: 84  Resp: (!) 21  Temp: 98 F (36.7 C)  TempSrc: Oral  Weight: 181 lb 12.8 oz (82.5 kg)  Height: 6' (1.829 m)   Body mass index is 24.66  kg/m.  Physical Exam  GENERAL APPEARANCE: Well nourished. In no acute distress. Normal body habitus SKIN:  Skin is warm and dry.  MOUTH and THROAT: Lips are without lesions. Oral mucosa is moist and without lesions.  RESPIRATORY: Breathing is even & unlabored, BS CTAB CARDIAC: RRR, no murmur,no extra heart sounds, no edema GI: Abdomen soft, normal BS, no masses, no tenderness NEUROLOGICAL: There is no tremor. Has left-sided weakness. PSYCHIATRIC:  Affect and behavior are appropriate   Labs reviewed: Recent Labs    01/10/20 0337 01/11/20 0256 01/12/20 0308  NA 137 137 137  K 4.2 4.0 4.0  CL 104 104 105  CO2 27 23 23   GLUCOSE 111* 118* 143*  BUN 23 23 25*  CREATININE 1.39* 1.35* 1.29*  CALCIUM 8.3* 7.9* 8.4*   Recent Labs    01/07/20 1814 01/08/20 0540 01/12/20 0308  AST 20  --  24  ALT 24  --  21  ALKPHOS 74  --  76  BILITOT 1.9* 1.8* 0.9  PROT 6.3*  --  6.0*  ALBUMIN 3.2*  --  2.6*   Recent Labs    01/07/20 1814 01/07/20 1835 01/10/20 0337  WBC 10.4  --  8.0  NEUTROABS 7.8*  --   --   HGB 14.8 14.6 14.2  HCT 45.4 43.0 41.9  MCV 93.0  --  90.3  PLT 151  --  142*    Lab Results  Component Value Date   HGBA1C 5.5 01/09/2020   Lab Results  Component Value Date   CHOL 169 01/09/2020   HDL 34 (L) 01/09/2020   LDLCALC 119 (H) 01/09/2020   TRIG 80 01/09/2020   CHOLHDL 5.0 01/09/2020    Assessment/Plan  1. Advance care planning -  Remains to be DNR - discussed medications, vital signs and weights  2. Late effects of cerebral ischemic stroke - stable, continue ASA and Atorvastatin - continue PT, OT and ST  3. Dysphagia as late effect of stroke - on restorative eating and ST - aspiration precautions  4. Late onset Alzheimer's disease without behavioral disturbance ([place]) - continue Memantine and Rivastigmine patch    Family/ staff Communication:  Discussed plan of care with resident.  Labs/tests ordered:  None  Goals of care:   Short-term  care   Durenda Age, DNP, FNP-BC [facility] and Adult Medicine [phone] (Monday-Friday 8:00 a.m. - 5:00 p.m.) [phone] (after hours)

## 2020-03-24 ENCOUNTER — Ambulatory Visit: Payer: Medicare Other | Admitting: Neurology

## 2020-04-03 ENCOUNTER — Encounter: Payer: Self-pay | Admitting: Internal Medicine

## 2020-04-03 ENCOUNTER — Non-Acute Institutional Stay (SKILLED_NURSING_FACILITY): Payer: Medicare Other | Admitting: Internal Medicine

## 2020-04-03 DIAGNOSIS — F0280 Dementia in other diseases classified elsewhere without behavioral disturbance: Secondary | ICD-10-CM | POA: Diagnosis not present

## 2020-04-03 DIAGNOSIS — R1319 Other dysphagia: Secondary | ICD-10-CM

## 2020-04-03 DIAGNOSIS — G301 Alzheimer's disease with late onset: Secondary | ICD-10-CM

## 2020-04-03 DIAGNOSIS — I63421 Cerebral infarction due to embolism of right anterior cerebral artery: Secondary | ICD-10-CM | POA: Diagnosis not present

## 2020-04-03 NOTE — Assessment & Plan Note (Addendum)
BIMS mental status score was 0 out of 15 upon admission to the SNF. Exelon and Namenda benefit is questionable at best.  It is possible that is present state could deteriorate off the medications.  Psych NP consultation could be considered.

## 2020-04-03 NOTE — Assessment & Plan Note (Addendum)
Speech therapy has the patient on finger foods.  He is also helped with meals.  Dysphagia to any significant degree is not reported with this protocol.

## 2020-04-03 NOTE — Assessment & Plan Note (Signed)
Situation is basically stable without improvement.  Clinically it is most unlikely that he could ever return home.

## 2020-04-03 NOTE — Patient Instructions (Signed)
See assessment and plan under each diagnosis in the problem list and acutely for this visit 

## 2020-04-03 NOTE — Progress Notes (Signed)
NURSING HOME LOCATION:  [facility] ROOM NUMBER:  103-A  CODE STATUS:  DNR  PCP:  Burton Apley, MD  [address] [place] [address]  This is a nursing facility follow up of chronic medical diagnoses.    Interim medical record and care since last [facility] visit was updated with review of diagnostic studies and change in clinical status since last visit were documented.  HPI: He is an 84 year old male admitted to the SNF following a hospitalization 1/11-1/19/2021 with acute stroke.  This is in the context of a history of advanced Alzheimer's for which he is on Exelon patch and Namenda.  Prior to placement he had lived at home with his wife. He had been admitted with confusion and inability to communicate verbally and the onset of left-sided weakness approximately 1 week prior to the ED visit.  Family was attempting to care for him at home, but ED visit was necessary due to his inability to ambulate with a walker resulting in a fall to the floor.  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  Clinical picture was associated with dysphagia and left hemineglect.  Initially permissive hypertension was pursued. He did have fever and tachypnea but chest x-ray and COVID-19 testing were negative. Ultrasound revealed right distal DVT in the posterior tibial veins but "not a true DVT".  He had been started on low-dose aspirin and Plavix 75 mg for a 16-day course beginning 1/19.  Additional anticoagulation was planned if there are was progression of the clotting. Here at the SNF BIMS mental status testing revealed a score of 0/15. PT/OT was to be completed at the SNF in attempt to see if he could return home with his wife.  Focus was on strengthening the left lower extremity to prevent future falls.  Alzheimer's dementia was diagnosed 09/25/2019 at the age of 77.  Review of systems: Dementia invalidated responses.  He was  totally nonverbal and did not follow any commands.  Speech therapy states that he is not exhibiting dysphagia.  He is on finger foods as well as being aided in meal intake.  PT is seeing the patient because of the contracture of his left lower extremity.  He is being transitioned to restorative care.  Physical exam:  Pertinent or positive findings: As noted he is nonverbal.  He stares blankly ahead.  He does not follow commands.  He does move his upper extremities restlessly and without purpose.  Pattern alopecia is present.  He has a IT consultant.  He exhibits constant mastication activity of his mouth and jaws.  He would not open his mouth for exam.  He has 1/2+ pitting edema at the left ankle.  The left lower extremity is contracted at the knee.  Pedal pulses are strong, posterior tibial stronger than the dorsalis pedis pulses.  General appearance: Adequately nourished; no acute distress, increased work of breathing is present.   Lymphatic: No lymphadenopathy about the head, neck, axilla. Eyes: No conjunctival inflammation or lid edema is present. There is no scleral icterus. Ears:  External ear exam shows no significant lesions or deformities.   Nose:  External nasal examination shows no deformity or inflammation. Nasal mucosa are pink and moist without lesions, exudates Oral exam:  Lips and gums are healthy appearing. There is no oropharyngeal erythema or exudate. Neck:  No thyromegaly, masses, tenderness noted.    Heart:  Normal rate and regular rhythm. S1 and S2 normal  without gallop, murmur, click, rub .  Lungs: Chest clear to auscultation without wheezes, rhonchi, rales, rubs. Abdomen: Bowel sounds are normal. Abdomen is soft and nontender with no organomegaly, hernias, masses. GU: Deferred  Extremities:  No cyanosis, clubbing  Neurologic exam :Balance, Rhomberg, finger to nose testing could not be completed due to clinical state Skin: Warm & dry w/o tenting. No significant lesions or  rash.  See summary under each active problem in the Problem List with associated updated therapeutic plan

## 2020-04-29 ENCOUNTER — Encounter: Payer: Self-pay | Admitting: Adult Health

## 2020-04-29 ENCOUNTER — Non-Acute Institutional Stay (SKILLED_NURSING_FACILITY): Payer: Medicare Other | Admitting: Adult Health

## 2020-04-29 DIAGNOSIS — G301 Alzheimer's disease with late onset: Secondary | ICD-10-CM

## 2020-04-29 DIAGNOSIS — N1831 Chronic kidney disease, stage 3a: Secondary | ICD-10-CM | POA: Diagnosis not present

## 2020-04-29 DIAGNOSIS — F0280 Dementia in other diseases classified elsewhere without behavioral disturbance: Secondary | ICD-10-CM | POA: Diagnosis not present

## 2020-04-29 DIAGNOSIS — E782 Mixed hyperlipidemia: Secondary | ICD-10-CM

## 2020-04-29 NOTE — Progress Notes (Signed)
Location:  [place] Room Number: 103-A Place of Service:  SNF (31) Provider:  Durenda Age, DNP, FNP-BC  Patient Care Team: Lorene Dy, MD as PCP - General (Internal Medicine)  Extended Emergency Contact Information Primary Emergency Contact: Medard, Decuir Address: [place] [address] [address] [place] of [place] Phone: [phone] Mobile Phone: [phone] Relation: Spouse Secondary Emergency Contact: Ehtan, Delfavero Mobile Phone: [phone] Relation: Son  Code Status:  DNR  Goals of care: Advanced Directive information Advanced Directives 04/29/2020  Does Patient Have a Medical Advance Directive? Yes  Type of Advance Directive Out of facility DNR (pink MOST or yellow form);Healthcare Power of Attorney  Does patient want to make changes to medical advance directive? No - Patient declined  Copy of [place] in Chart? Yes - validated most recent copy scanned in chart (See row information)  Pre-existing out of facility DNR order (yellow form or pink MOST form) Yellow form placed in chart (order not valid for inpatient use)     Chief Complaint  Patient presents with  . Medical Management of Chronic Issues    Routine [facility] SN[facility] visit    HPI:  Pt is an 84 y.o. male seen today for medical management of chronic diseases.  He is a long-term care resident of [facility].  He has a PMH of advanced dementia.  He is currently participating in restorative dining.  He needs set up and maximal cueing to participate in self-feeding. He is needing extensive assistance with ADL care.   Past Medical History:  Diagnosis Date  . Alzheimer disease ([place]) 09/25/2019   Past Surgical History:  Procedure Laterality Date  . APPENDECTOMY    . BACK SURGERY    . CHOLECYSTECTOMY    . HERNIA REPAIR      Allergies  Allergen Reactions  . Demerol [Meperidine] Nausea And Vomiting     Outpatient Encounter Medications as of 04/29/2020  Medication Sig  . acetaminophen (TYLENOL) 325 MG tablet Take 650 mg by mouth every 6 (six) hours as needed.  Marland Kitchen aspirin EC 81 MG EC tablet Take 1 tablet (81 mg total) by mouth daily.  Marland Kitchen atorvastatin (LIPITOR) 40 MG tablet Take 1 tablet (40 mg total) by mouth daily at 6 PM.  . magnesium hydroxide (MILK OF MAGNESIA) 400 MG/5ML suspension If no BM in 3 days, give 30 cc Milk of Magnesium p.o. x 1 dose in 24 hours as needed (Do not use standing constipation orders for residents with renal failure CFR less than 30. Contact MD for orders) (Physician Order)  . memantine (NAMENDA) 10 MG tablet Take 10 mg by mouth 2 (two) times daily.   . Nutritional Supplement LIQD Take 120 mLs by mouth 2 (two) times daily. MedPass  . rivastigmine (EXELON) 4.6 mg/24hr Place 1 patch (4.6 mg total) onto the skin daily.  Marland Kitchen senna-docusate (SENOKOT-S) 8.6-50 MG tablet Take 1 tablet by mouth at bedtime as needed for mild constipation.   No facility-administered encounter medications on file as of 04/29/2020.    Review of Systems unable to obtain due to dementia    Immunization History  Administered Date(s) Administered  . Influenza-Unspecified 09/27/2019  . PPD Test 01/09/2020   Pertinent  Health Maintenance Due  Topic Date Due  . PNA vac Low Risk Adult (1 of 2 - PCV13) 02/24/2021 (Originally 01/04/2003)  . INFLUENZA VACCINE  07/27/2020    Vitals:   04/29/20  1131  BP: 139/77  Pulse: 75  Resp: 20  Temp: (!) 97 F (36.1 C)  TempSrc: Oral  SpO2: 97%  Weight: 176 lb (79.8 kg)  Height: 6' (1.829 m)   Body mass index is 23.87 kg/m.  Physical Exam  GENERAL APPEARANCE: Well nourished. In no acute distress. Normal body habitus SKIN:  Skin is warm and dry.  MOUTH and THROAT: Lips are without lesions. Oral mucosa is moist and without lesions. Splint/brace RESPIRATORY: Breathing is even & unlabored, BS CTAB CARDIAC: RRR, no murmur,no extra heart sounds, no  edema GI: Abdomen soft, normal BS, no masses, no tenderness EXTREMITIES:  Left knee with splint/brace NEUROLOGICAL: There is no tremor.  PSYCHIATRIC:  Affect and behavior are appropriate  Labs reviewed: Recent Labs    01/10/20 0337 01/11/20 0256 01/12/20 0308  NA 137 137 137  K 4.2 4.0 4.0  CL 104 104 105  CO2 27 23 23   GLUCOSE 111* 118* 143*  BUN 23 23 25*  CREATININE 1.39* 1.35* 1.29*  CALCIUM 8.3* 7.9* 8.4*   Recent Labs    01/07/20 1814 01/08/20 0540 01/12/20 0308  AST 20  --  24  ALT 24  --  21  ALKPHOS 74  --  76  BILITOT 1.9* 1.8* 0.9  PROT 6.3*  --  6.0*  ALBUMIN 3.2*  --  2.6*   Recent Labs    01/07/20 1814 01/07/20 1835 01/10/20 0337  WBC 10.4  --  8.0  NEUTROABS 7.8*  --   --   HGB 14.8 14.6 14.2  HCT 45.4 43.0 41.9  MCV 93.0  --  90.3  PLT 151  --  142*    Lab Results  Component Value Date   HGBA1C 5.5 01/09/2020   Lab Results  Component Value Date   CHOL 169 01/09/2020   HDL 34 (L) 01/09/2020   LDLCALC 119 (H) 01/09/2020   TRIG 80 01/09/2020   CHOLHDL 5.0 01/09/2020    Assessment/Plan  1. Chronic kidney disease, stage 3a Lab Results  Component Value Date   CREATININE 1.29 (H) 01/12/2020   -  Stable, will monitor  2. Mixed hyperlipidemia Lab Results  Component Value Date   CHOL 169 01/09/2020   HDL 34 (L) 01/09/2020   LDLCALC 119 (H) 01/09/2020   TRIG 80 01/09/2020   CHOLHDL 5.0 01/09/2020   -Continue atorvastatin  3. Late onset Alzheimer's disease without behavioral disturbance (HCC) -Continue Exelon patch and memantine -Continue supportive care, fall precautions     Family/ staff Communication: Discussed plan of care with resident and charge nurse.  Labs/tests ordered: CMP, CBC and TSH  Goals of care: Long-term care   01/11/2020, DNP, FNP-BC [facility] [phone] (Monday-Friday 8:00 a.m. - 5:00 p.m.) [phone] (after hours)

## 2020-05-05 LAB — HEPATIC FUNCTION PANEL
ALT: 20 (ref 10–40)
AST: 29 (ref 14–40)
Alkaline Phosphatase: 256 — AB (ref 25–125)
Bilirubin, Total: 0.5

## 2020-05-05 LAB — CBC AND DIFFERENTIAL
HCT: 38 — AB (ref 41–53)
Hemoglobin: 12.4 — AB (ref 13.5–17.5)
Neutrophils Absolute: 5
Platelets: 166 (ref 150–399)
WBC: 8.6

## 2020-05-05 LAB — BASIC METABOLIC PANEL
BUN: 13 (ref 4–21)
CO2: 24 — AB (ref 13–22)
Chloride: 109 — AB (ref 99–108)
Creatinine: 0.9 (ref 0.6–1.3)
Glucose: 99
Potassium: 3.8 (ref 3.4–5.3)
Sodium: 146 (ref 137–147)

## 2020-05-05 LAB — COMPREHENSIVE METABOLIC PANEL
Albumin: 3.3 — AB (ref 3.5–5.0)
Calcium: 9 (ref 8.7–10.7)
GFR calc Af Amer: 90
GFR calc non Af Amer: 80.19
Globulin: 2.7

## 2020-05-05 LAB — CBC: RBC: 4.1 (ref 3.87–5.11)

## 2020-05-05 LAB — TSH: TSH: 2.36 (ref 0.41–5.90)

## 2020-05-27 ENCOUNTER — Non-Acute Institutional Stay (SKILLED_NURSING_FACILITY): Payer: Medicare Other | Admitting: Adult Health

## 2020-05-27 ENCOUNTER — Encounter: Payer: Self-pay | Admitting: Adult Health

## 2020-05-27 DIAGNOSIS — I6930 Unspecified sequelae of cerebral infarction: Secondary | ICD-10-CM

## 2020-05-27 DIAGNOSIS — G301 Alzheimer's disease with late onset: Secondary | ICD-10-CM

## 2020-05-27 DIAGNOSIS — E782 Mixed hyperlipidemia: Secondary | ICD-10-CM

## 2020-05-27 DIAGNOSIS — F0280 Dementia in other diseases classified elsewhere without behavioral disturbance: Secondary | ICD-10-CM

## 2020-05-27 DIAGNOSIS — I63421 Cerebral infarction due to embolism of right anterior cerebral artery: Secondary | ICD-10-CM | POA: Diagnosis not present

## 2020-05-27 NOTE — Progress Notes (Addendum)
Location:  [facility] Room Number: 103-A Place of Service:  SNF (31) Provider:  Kenard Gower, DNP, FNP-BC  Patient Care Team: Burton Apley, MD as PCP - General (Internal Medicine)  Extended Emergency Contact Information Primary Emergency Contact: Naithen, [place] Address: [address]          [place] [address] [place] of [place] Home Phone: [phone] Mobile Phone: [phone] Relation: Spouse Secondary Emergency Contact: Dearius, Hoffmann Mobile Phone: [phone] Relation: Son  Code Status:  DNR  Goals of care: Advanced Directive information Advanced Directives 05/27/2020  Does Patient Have a Medical Advance Directive? Yes  Type of Advance Directive Out of facility DNR (pink MOST or yellow form);Healthcare Power of Attorney  Does patient want to make changes to medical advance directive? No - Patient declined  Copy of Healthcare Power of Attorney in Chart? Yes - validated most recent copy scanned in chart (See row information)  Pre-existing out of facility DNR order (yellow form or pink MOST form) Yellow form placed in chart (order not valid for inpatient use)     Chief Complaint  Patient presents with  . Medical Management of Chronic Issues    Routine [facility] visit     HPI:  Pt is an 84 y.o. male seen today for medical management of chronic diseases. He is a long-term care resident of [facility]. He has a PMH of advanced dementia. Currently, he participates in restorative dining wherein he sits up on a broda chair or bed for all meals. He got his first COVID-19 vaccination on 05/05/20. There was no reported adverse reactions.    Past Medical History:  Diagnosis Date  . Alzheimer disease (HCC) 09/25/2019   Past Surgical History:  Procedure Laterality Date  . APPENDECTOMY    . BACK SURGERY    . CHOLECYSTECTOMY    . HERNIA REPAIR      Allergies  Allergen Reactions  . Demerol [Meperidine]  Nausea And Vomiting    Outpatient Encounter Medications as of 05/27/2020  Medication Sig  . acetaminophen (TYLENOL) 325 MG tablet Take 650 mg by mouth every 6 (six) hours as needed.  Marland Kitchen aspirin EC 81 MG EC tablet Take 1 tablet (81 mg total) by mouth daily.  Marland Kitchen atorvastatin (LIPITOR) 40 MG tablet Take 1 tablet (40 mg total) by mouth daily at 6 PM.  . magnesium hydroxide (MILK OF MAGNESIA) 400 MG/5ML suspension If no BM in 3 days, give 30 cc Milk of Magnesium p.o. x 1 dose in 24 hours as needed (Do not use standing constipation orders for residents with renal failure CFR less than 30. Contact MD for orders) (Physician Order)  . memantine (NAMENDA) 10 MG tablet Take 10 mg by mouth 2 (two) times daily.   . Nutritional Supplement LIQD Take 120 mLs by mouth 2 (two) times daily. MedPass  . rivastigmine (EXELON) 4.6 mg/24hr Place 1 patch (4.6 mg total) onto the skin daily.  Marland Kitchen senna-docusate (SENOKOT-S) 8.6-50 MG tablet Take 1 tablet by mouth at bedtime as needed for mild constipation.   No facility-administered encounter medications on file as of 05/27/2020.    Review of Systems  Unable to obtain due to dementia   Immunization History  Administered Date(s) Administered  . Influenza-Unspecified 09/27/2019  . PPD Test 01/09/2020   Pertinent  Health Maintenance Due  Topic Date Due  . PNA vac Low Risk Adult (1 of 2 - PCV13) 02/24/2021 (Originally 01/04/2003)  . INFLUENZA VACCINE  07/27/2020  Vitals:   05/27/20 1051 05/27/20 1052  BP: (!) 146/81 (!) 155/79  Pulse: 97   Resp: 18   Temp: 97.9 F (36.6 C)   TempSrc: Oral   SpO2: 97%   Weight: 180 lb 3.2 oz (81.7 kg)   Height: 6' (1.829 m)    Body mass index is 24.44 kg/m.  Physical Exam  GENERAL APPEARANCE: Well nourished. In no acute distress. Normal body habitus SKIN:  Skin is warm and dry.  MOUTH and THROAT: Lips are without lesions. Oral mucosa is moist and without lesions.  RESPIRATORY: Breathing is even & unlabored, BS  CTAB CARDIAC: RRR, no murmur,no extra heart sounds, no edema GI: Abdomen soft, normal BS, no masses, no tenderness EXTREMITIES:  Left knee contracted with brace NEUROLOGICAL: There is no tremor. Left-sided weakness. PSYCHIATRIC:  Affect and behavior are appropriate  Labs reviewed: Recent Labs    01/10/20 0337 01/11/20 0256 01/12/20 0308  NA 137 137 137  K 4.2 4.0 4.0  CL 104 104 105  CO2 27 23 23   GLUCOSE 111* 118* 143*  BUN 23 23 25*  CREATININE 1.39* 1.35* 1.29*  CALCIUM 8.3* 7.9* 8.4*   Recent Labs    01/07/20 1814 01/08/20 0540 01/12/20 0308  AST 20  --  24  ALT 24  --  21  ALKPHOS 74  --  76  BILITOT 1.9* 1.8* 0.9  PROT 6.3*  --  6.0*  ALBUMIN 3.2*  --  2.6*   Recent Labs    01/07/20 1814 01/07/20 1835 01/10/20 0337  WBC 10.4  --  8.0  NEUTROABS 7.8*  --   --   HGB 14.8 14.6 14.2  HCT 45.4 43.0 41.9  MCV 93.0  --  90.3  PLT 151  --  142*    Lab Results  Component Value Date   HGBA1C 5.5 01/09/2020   Lab Results  Component Value Date   CHOL 169 01/09/2020   HDL 34 (L) 01/09/2020   LDLCALC 119 (H) 01/09/2020   TRIG 80 01/09/2020   CHOLHDL 5.0 01/09/2020    Assessment/Plan  1. Late effects of cerebral ischemic stroke - has left-sided weakness - continue ASA EC 81 mg 1 tab daily and atorvastatin 40 mg 1 tab daily  2. Mixed hyperlipidemia Lab Results  Component Value Date   CHOL 169 01/09/2020   HDL 34 (L) 01/09/2020   LDLCALC 119 (H) 01/09/2020   TRIG 80 01/09/2020   CHOLHDL 5.0 01/09/2020   -Continue atorvastatin 40 mg 1 tab daily  3. Cerebrovascular accident (CVA) due to embolism of right anterior cerebral artery (HCC) -Stable, continue atorvastatin 40 mg 1 tab daily and aspirin EC 81 mg 1 tab daily  4. Late onset of Alzheimer's disease with behavioral disturbance ([place]) -Continue memantine 10 mg twice a day and rivastigmine 4.6 mg/24-hour 1 patch onto the skin daily -Continue supportive care   Family/ staff Communication:   Discussed plan of care with resident and charge nurse  Labs/tests ordered:  None  Goals of care:   Long-term care    Durenda Age, DNP, FNP-BC [facility] [phone] (Monday-Friday 8:00 a.m. - 5:00 p.m.) [phone] (after hours)

## 2020-05-29 ENCOUNTER — Telehealth: Payer: Self-pay | Admitting: Neurology

## 2020-05-29 NOTE — Telephone Encounter (Signed)
IF pt wife call back please state per nurse. Which medication is she referring to and what are her concerns about it.   I left vm for wife to call back.

## 2020-05-29 NOTE — Telephone Encounter (Signed)
Pt wife called to request a cb from RN to discuss medications. Attempted to get further information but wife insisted message be sent to RN and she will speak to RN.

## 2020-06-03 ENCOUNTER — Encounter: Payer: Self-pay | Admitting: Adult Health

## 2020-06-03 NOTE — Progress Notes (Signed)
This encounter was created in error - please disregard.

## 2020-06-03 NOTE — Progress Notes (Addendum)
Location:  [facility] Room Number: 103-A Place of Service:  SNF (31) Provider:  Durenda Age, DNP, FNP-BC  Patient Care Team: Lorene Dy, MD as PCP - General (Internal Medicine)  Extended Emergency Contact Information Primary Emergency Contact: Jaedin, Regina Address: [place] [address] [address] of [place] Phone: [phone] Mobile Phone: [phone] Relation: Spouse Secondary Emergency Contact: Gabriele, [place] Mobile Phone: [phone] Relation: Son  Code Status:  DNR  Goals of care: Advanced Directive information Advanced Directives 05/27/2020  Does Patient Have a Medical Advance Directive? Yes  Type of Advance Directive Out of facility DNR (pink MOST or yellow form);Healthcare Power of Attorney  Does patient want to make changes to medical advance directive? No - Patient declined  Copy of [place] in Chart? Yes - validated most recent copy scanned in chart (See row information)  Pre-existing out of facility DNR order (yellow form or pink MOST form) Yellow form placed in chart (order not valid for inpatient use)     Chief Complaint  Patient presents with  . Advanced Directive    Patient is seen for a Care Plan Meeting    HPI:  Pt is an 84 y.o. male seen today for a care plan meeting.  He is a long-term care resident of [facility]. He has a PMH of advanced dementia. The meeting was attended by social worker, [place] coordinator, NP and son, Kaydon Husby, and spouse, Deloris Norphlet, who attended via telephone conference. He remains to be DNR. Discussed medications, vital signs and weights. Answered medical questions by son. The second Moderna COVID-19 vaccine was given on 6/04. No adverse reaction was reported. He participates in restorative dining wherein set-up and verbal cuing during meals is done. The care plan meeting lasted for 20 minutes.   Past Medical  History:  Diagnosis Date  . Alzheimer disease ([place]) 09/25/2019   Past Surgical History:  Procedure Laterality Date  . APPENDECTOMY    . BACK SURGERY    . CHOLECYSTECTOMY    . HERNIA REPAIR      Allergies  Allergen Reactions  . Demerol [Meperidine] Nausea And Vomiting    Outpatient Encounter Medications as of 06/04/2020  Medication Sig  . acetaminophen (TYLENOL) 325 MG tablet Take 650 mg by mouth every 6 (six) hours as needed.  Marland Kitchen aspirin EC 81 MG EC tablet Take 1 tablet (81 mg total) by mouth daily.  Marland Kitchen atorvastatin (LIPITOR) 40 MG tablet Take 1 tablet (40 mg total) by mouth daily at 6 PM.  . magnesium hydroxide (MILK OF MAGNESIA) 400 MG/5ML suspension If no BM in 3 days, give 30 cc Milk of Magnesium p.o. x 1 dose in 24 hours as needed (Do not use standing constipation orders for residents with renal failure CFR less than 30. Contact MD for orders) (Physician Order)  . memantine (NAMENDA) 10 MG tablet Take 10 mg by mouth 2 (two) times daily.   . Nutritional Supplement LIQD Take 120 mLs by mouth 2 (two) times daily. MedPass  . rivastigmine (EXELON) 4.6 mg/24hr Place 1 patch (4.6 mg total) onto the skin daily.  Marland Kitchen senna-docusate (SENOKOT-S) 8.6-50 MG tablet Take 1 tablet by mouth at bedtime as needed for mild constipation.   No facility-administered encounter medications on file as of 06/04/2020.    Review of Systems  Unable to obtain due to dementia    Immunization History  Administered Date(s) Administered  .  Influenza-Unspecified 09/27/2019  . PPD Test 01/09/2020   Pertinent  Health Maintenance Due  Topic Date Due  . PNA vac Low Risk Adult (1 of 2 - PCV13) 02/24/2021 (Originally 01/04/2003)  . INFLUENZA VACCINE  07/27/2020    Vitals:   06/03/20 1442  BP: 110/62  Pulse: 76  Resp: 20  Temp: 98.1 F (36.7 C)  TempSrc: Oral  Weight: 175 lb 9.6 oz (79.7 kg)  Height: 6' (1.829 m)   Body mass index is 23.82 kg/m.  Physical Exam  GENERAL APPEARANCE: Well nourished. In no  acute distress. Normal body habitus SKIN:  Skin is warm and dry.  MOUTH and THROAT: Lips are without lesions. Oral mucosa is moist and without lesions.  RESPIRATORY: Breathing is even & unlabored, BS CTAB CARDIAC: RRR, no murmur,no extra heart sounds, no edema GI: Abdomen soft, normal BS, no masses, no tenderness NEUROLOGICAL: There is no tremor. Mostly nonverbal. Left-sided weakness. PSYCHIATRIC:  Affect and behavior are appropriate  Labs reviewed: Recent Labs    01/10/20 0337 01/11/20 0256 01/12/20 0308  NA 137 137 137  K 4.2 4.0 4.0  CL 104 104 105  CO2 27 23 23   GLUCOSE 111* 118* 143*  BUN 23 23 25*  CREATININE 1.39* 1.35* 1.29*  CALCIUM 8.3* 7.9* 8.4*   Recent Labs    01/07/20 1814 01/08/20 0540 01/12/20 0308  AST 20  --  24  ALT 24  --  21  ALKPHOS 74  --  76  BILITOT 1.9* 1.8* 0.9  PROT 6.3*  --  6.0*  ALBUMIN 3.2*  --  2.6*   Recent Labs    01/07/20 1814 01/07/20 1835 01/10/20 0337  WBC 10.4  --  8.0  NEUTROABS 7.8*  --   --   HGB 14.8 14.6 14.2  HCT 45.4 43.0 41.9  MCV 93.0  --  90.3  PLT 151  --  142*    Lab Results  Component Value Date   HGBA1C 5.5 01/09/2020   Lab Results  Component Value Date   CHOL 169 01/09/2020   HDL 34 (L) 01/09/2020   LDLCALC 119 (H) 01/09/2020   TRIG 80 01/09/2020   CHOLHDL 5.0 01/09/2020    Assessment/Plan  1. Advance care planning - remains to be DNR -Discussed medications, vital signs and weights  2. History of CVA (cerebrovascular accident) -Has left-sided weakness, continue atorvastatin and aspirin  3. Mixed hyperlipidemia Lab Results  Component Value Date   CHOL 169 01/09/2020   HDL 34 (L) 01/09/2020   LDLCALC 119 (H) 01/09/2020   TRIG 80 01/09/2020   CHOLHDL 5.0 01/09/2020   -Continue atorvastatin  4. Late onset Alzheimer's disease with behavioral disturbance (HCC) -Rivastigmine increased from 4.6 mg to 9.5 mg/24-hour patch daily on 05/28/20 and continue memantine 10 mg twice a  day     Family/ staff Communication: Discussed plan of care with son, spouse and IDT.  Labs/tests ordered: None  Goals of care:   Long-term care   07/28/20, DNP, FNP-BC [facility] [phone] (Monday-Friday 8:00 a.m. - 5:00 p.m.) [phone] (after hours)

## 2020-06-03 NOTE — Progress Notes (Deleted)
Location:  Chief of Staff of Service:  SNF (31) Provider:  Kenard Gower, DNP, FNP-BC  Patient Care Team: Burton Apley, MD as PCP - General (Internal Medicine)  Extended Emergency Contact Information Primary Emergency Contact: Jaesean, Litzau Address: [address]          [place] [address] [place] Home Phone: [phone] Mobile Phone: [phone] Relation: Spouse Secondary Emergency Contact: Makenzie, Vittorio Mobile Phone: [phone] Relation: Son  Code Status:  ***  Goals of care: Advanced Directive information Advanced Directives 05/27/2020  Does Patient Have a Medical Advance Directive? Yes  Type of Advance Directive Out of facility DNR (pink MOST or yellow form);Healthcare Power of Attorney  Does patient want to make changes to medical advance directive? No - Patient declined  Copy of Healthcare Power of Attorney in Chart? Yes - validated most recent copy scanned in chart (See row information)  Pre-existing out of facility DNR order (yellow form or pink MOST form) Yellow form placed in chart (order not valid for inpatient use)     Chief Complaint  Patient presents with  . Advanced Directive    Patient is seen for a Care Plan Meeting    HPI:  Pt is a 84 y.o. male seen today for medical management of chronic diseases.     Past Medical History:  Diagnosis Date  . Alzheimer disease (HCC) 09/25/2019   Past Surgical History:  Procedure Laterality Date  . APPENDECTOMY    . BACK SURGERY    . CHOLECYSTECTOMY    . HERNIA REPAIR      Allergies  Allergen Reactions  . Demerol [Meperidine] Nausea And Vomiting    Outpatient Encounter Medications as of 06/03/2020  Medication Sig  . acetaminophen (TYLENOL) 325 MG tablet Take 650 mg by mouth every 6 (six) hours as needed.  Marland Kitchen aspirin EC 81 MG EC tablet Take 1 tablet (81 mg total) by mouth daily.  Marland Kitchen atorvastatin (LIPITOR) 40 MG tablet Take 1 tablet (40 mg total) by mouth daily  at 6 PM.  . magnesium hydroxide (MILK OF MAGNESIA) 400 MG/5ML suspension If no BM in 3 days, give 30 cc Milk of Magnesium p.o. x 1 dose in 24 hours as needed (Do not use standing constipation orders for residents with renal failure CFR less than 30. Contact MD for orders) (Physician Order)  . memantine (NAMENDA) 10 MG tablet Take 10 mg by mouth 2 (two) times daily.   . Nutritional Supplement LIQD Take 120 mLs by mouth 2 (two) times daily. MedPass  . rivastigmine (EXELON) 4.6 mg/24hr Place 1 patch (4.6 mg total) onto the skin daily.  Marland Kitchen senna-docusate (SENOKOT-S) 8.6-50 MG tablet Take 1 tablet by mouth at bedtime as needed for mild constipation.   No facility-administered encounter medications on file as of 06/03/2020.    Review of Systems  GENERAL: No change in appetite, no fatigue, no weight changes, no fever, chills or weakness SKIN: Denies rash, itching, wounds, ulcer sores, or nail abnormalities EYES: Denies change in vision, dry eyes, eye pain, itching or discharge EARS: Denies change in hearing, ringing in ears, or earache NOSE: Denies nasal congestion or epistaxis MOUTH and THROAT: Denies oral discomfort, gingival pain or bleeding, pain from teeth or hoarseness   RESPIRATORY: no cough, SOB, DOE, wheezing, hemoptysis CARDIAC: No chest pain, edema or palpitations GI: No abdominal pain, diarrhea, constipation, heart burn, nausea or vomiting GU: Denies dysuria, frequency, hematuria, incontinence, or discharge MUSCULOSKELETAL: Denies joint pain, muscle pain, back  pain, restricted movement, or unusual weakness CIRCULATION: Denies claudication, edema of legs, varicosities, or cold extremities NEUROLOGICAL: Denies dizziness, syncope, numbness, or headache PSYCHIATRIC: Denies feelings of depression or anxiety. No report of hallucinations, insomnia, paranoia, or agitation ENDOCRINE: Denies polyphagia, polyuria, polydipsia, heat or cold intolerance HEME/LYMPH: Denies excessive bruising, petechia,  enlarged lymph nodes, or bleeding problems IMMUNOLOGIC: Denies history of frequent infections, AIDS, or use of immunosuppressive agents   Immunization History  Administered Date(s) Administered  . Influenza-Unspecified 09/27/2019  . PPD Test 01/09/2020   Pertinent  Health Maintenance Due  Topic Date Due  . PNA vac Low Risk Adult (1 of 2 - PCV13) 02/24/2021 (Originally 01/04/2003)  . INFLUENZA VACCINE  07/27/2020   No flowsheet data found.   Vitals:   06/03/20 1435  Weight: 180 lb 3.2 oz (81.7 kg)   Body mass index is 24.44 kg/m.  Physical Exam  GENERAL APPEARANCE: Well nourished. In no acute distress. Normal body habitus SKIN:  Skin is warm and dry. There are no suspicious lesions or rash HEAD: Normal in size and contour. No evidence of trauma EYES: Lids open and close normally. No blepharitis, entropion or ectropion. PERRL. Conjunctivae are clear and sclerae are white. Lenses are without opacity EARS: Pinnae are normal. Patient hears normal voice tunes of the examiner MOUTH and THROAT: Lips are without lesions. Oral mucosa is moist and without lesions. Tongue is normal in shape, size, and color and without lesions NECK: supple, trachea midline, no neck masses, no thyroid tenderness, no thyromegaly LYMPHATICS: No LAN in the neck, no supraclavicular LAN RESPIRATORY: Breathing is even & unlabored, BS CTAB CARDIAC: RRR, no murmur,no extra heart sounds, no edema GI: Abdomen soft, normal BS, no masses, no tenderness, no hepatomegaly, no splenomegaly MUSCULOSKELETAL: No deformities. Movement at each extremity is full and painless. Strength is 5/5 at each extremity. Back is without kyphosis or scoliosis CIRCULATION: Pedal pulses are 2+. There is no edema of the legs, ankles and feet NEUROLOGICAL: There is no tremor. Speech is clear PSYCHIATRIC: Alert and oriented X 3. Affect and behavior are appropriate  Labs reviewed: Recent Labs    01/10/20 0337 01/11/20 0256 01/12/20 0308    NA 137 137 137  K 4.2 4.0 4.0  CL 104 104 105  CO2 27 23 23   GLUCOSE 111* 118* 143*  BUN 23 23 25*  CREATININE 1.39* 1.35* 1.29*  CALCIUM 8.3* 7.9* 8.4*   Recent Labs    01/07/20 1814 01/08/20 0540 01/12/20 0308  AST 20  --  24  ALT 24  --  21  ALKPHOS 74  --  76  BILITOT 1.9* 1.8* 0.9  PROT 6.3*  --  6.0*  ALBUMIN 3.2*  --  2.6*   Recent Labs    01/07/20 1814 01/07/20 1835 01/10/20 0337  WBC 10.4  --  8.0  NEUTROABS 7.8*  --   --   HGB 14.8 14.6 14.2  HCT 45.4 43.0 41.9  MCV 93.0  --  90.3  PLT 151  --  142*   No results found for: TSH Lab Results  Component Value Date   HGBA1C 5.5 01/09/2020   Lab Results  Component Value Date   CHOL 169 01/09/2020   HDL 34 (L) 01/09/2020   LDLCALC 119 (H) 01/09/2020   TRIG 80 01/09/2020   CHOLHDL 5.0 01/09/2020    Significant Diagnostic Results in last 30 days:  No results found.  Assessment/Plan ***   Family/ staff Communication: ***  Labs/tests ordered:  ***  Goals of care:   ***   Durenda Age, DNP, FNP-BC [facility] [phone] (Monday-Friday 8:00 a.m. - 5:00 p.m.) [phone] (after hours)

## 2020-06-04 ENCOUNTER — Non-Acute Institutional Stay (SKILLED_NURSING_FACILITY): Payer: Medicare Other | Admitting: Adult Health

## 2020-06-04 DIAGNOSIS — Z7189 Other specified counseling: Secondary | ICD-10-CM | POA: Diagnosis not present

## 2020-06-04 DIAGNOSIS — F02818 Dementia in other diseases classified elsewhere, unspecified severity, with other behavioral disturbance: Secondary | ICD-10-CM

## 2020-06-04 DIAGNOSIS — E782 Mixed hyperlipidemia: Secondary | ICD-10-CM

## 2020-06-04 DIAGNOSIS — G301 Alzheimer's disease with late onset: Secondary | ICD-10-CM | POA: Diagnosis not present

## 2020-06-04 DIAGNOSIS — Z8673 Personal history of transient ischemic attack (TIA), and cerebral infarction without residual deficits: Secondary | ICD-10-CM

## 2020-06-04 DIAGNOSIS — F0281 Dementia in other diseases classified elsewhere with behavioral disturbance: Secondary | ICD-10-CM

## 2020-06-11 ENCOUNTER — Encounter: Payer: Self-pay | Admitting: Adult Health

## 2020-07-09 ENCOUNTER — Non-Acute Institutional Stay (SKILLED_NURSING_FACILITY): Payer: Medicare Other | Admitting: Adult Health

## 2020-07-09 ENCOUNTER — Encounter: Payer: Self-pay | Admitting: Adult Health

## 2020-07-09 DIAGNOSIS — F0281 Dementia in other diseases classified elsewhere with behavioral disturbance: Secondary | ICD-10-CM

## 2020-07-09 DIAGNOSIS — F02818 Dementia in other diseases classified elsewhere, unspecified severity, with other behavioral disturbance: Secondary | ICD-10-CM

## 2020-07-09 DIAGNOSIS — E870 Hyperosmolality and hypernatremia: Secondary | ICD-10-CM | POA: Diagnosis not present

## 2020-07-09 DIAGNOSIS — Z8673 Personal history of transient ischemic attack (TIA), and cerebral infarction without residual deficits: Secondary | ICD-10-CM | POA: Diagnosis not present

## 2020-07-09 DIAGNOSIS — G301 Alzheimer's disease with late onset: Secondary | ICD-10-CM

## 2020-07-09 DIAGNOSIS — E782 Mixed hyperlipidemia: Secondary | ICD-10-CM | POA: Diagnosis not present

## 2020-07-09 LAB — BASIC METABOLIC PANEL
BUN: 19 (ref 4–21)
CO2: 27 — AB (ref 13–22)
Chloride: 107 (ref 99–108)
Creatinine: 0.9 (ref 0.6–1.3)
Glucose: 110
Potassium: 3.8 (ref 3.4–5.3)
Sodium: 144 (ref 137–147)

## 2020-07-09 LAB — COMPREHENSIVE METABOLIC PANEL
Calcium: 8.9 (ref 8.7–10.7)
GFR calc Af Amer: 90
GFR calc non Af Amer: 80.47

## 2020-07-09 NOTE — Progress Notes (Signed)
Location:  [facility] Room Number: 103-A Place of Service:  SNF (31) Provider:  Kenard Gower, DNP, FNP-BC  Patient Care Team: Burton Apley, MD as PCP - General (Internal Medicine)  Extended Emergency Contact Information Primary Emergency Contact: Huy, Majid Address: [address]          [place] [address] [place] Home Phone: [phone] Mobile Phone: [phone] Relation: Spouse Secondary Emergency Contact: Ralph, Brouwer Mobile Phone: [phone] Relation: Son  Code Status:  DNR  Goals of care: Advanced Directive information Advanced Directives 05/27/2020  Does Patient Have a Medical Advance Directive? Yes  Type of Advance Directive Out of facility DNR (pink MOST or yellow form);Healthcare Power of Attorney  Does patient want to make changes to medical advance directive? No - Patient declined  Copy of Healthcare Power of Attorney in Chart? Yes - validated most recent copy scanned in chart (See row information)  Pre-existing out of facility DNR order (yellow form or pink MOST form) Yellow form placed in chart (order not valid for inpatient use)     Chief Complaint  Patient presents with  . Medical Management of Chronic Issues    Routine [facility] visit    HPI:  Pt is an 84 y.o. male seen today for medical management of chronic diseases.  He is a long-term care resident of [facility].  He has a PMH of advanced dementia.  He was seen in his room today. Currently, he participates in restorative dining. He requires max (variable, tactile and visual) cuing.  SBPs ranging from 115-150.  He is currently not on any antihypertensive medication.  Latest weight is 152. Lbs, Body mass index is 21.78 kg/m.   Past Medical History:  Diagnosis Date  . Alzheimer disease (HCC) 09/25/2019   Past Surgical History:  Procedure Laterality Date  . APPENDECTOMY    . BACK SURGERY    . CHOLECYSTECTOMY      . HERNIA REPAIR      Allergies  Allergen Reactions  . Demerol [Meperidine] Nausea And Vomiting    Outpatient Encounter Medications as of 07/09/2020  Medication Sig  . acetaminophen (TYLENOL) 325 MG tablet Take 650 mg by mouth every 6 (six) hours as needed.  Marland Kitchen aspirin EC 81 MG EC tablet Take 1 tablet (81 mg total) by mouth daily.  Marland Kitchen atorvastatin (LIPITOR) 40 MG tablet Take 1 tablet (40 mg total) by mouth daily at 6 PM.  . magnesium hydroxide (MILK OF MAGNESIA) 400 MG/5ML suspension If no BM in 3 days, give 30 cc Milk of Magnesium p.o. x 1 dose in 24 hours as needed (Do not use standing constipation orders for residents with renal failure CFR less than 30. Contact MD for orders) (Physician Order)  . memantine (NAMENDA) 10 MG tablet Take 10 mg by mouth 2 (two) times daily.   . Nutritional Supplement LIQD Take 120 mLs by mouth in the morning, at noon, and at bedtime. MedPass  . rivastigmine (EXELON) 4.6 mg/24hr Place 1 patch (4.6 mg total) onto the skin daily.  Marland Kitchen senna-docusate (SENOKOT-S) 8.6-50 MG tablet Take 1 tablet by mouth at bedtime as needed for mild constipation.   No facility-administered encounter medications on file as of 07/09/2020.    Review of Systems   unable to obtain dementia    Immunization History  Administered Date(s) Administered  . Influenza-Unspecified 09/27/2019  . PPD Test 01/09/2020   Pertinent  Health Maintenance Due  Topic Date Due  . PNA  vac Low Risk Adult (1 of 2 - PCV13) 02/24/2021 (Originally 01/04/2003)  . INFLUENZA VACCINE  07/27/2020    Vitals:   07/09/20 1018  BP: 135/63  Pulse: 69  Resp: 17  Temp: (!) 97.3 F (36.3 C)  TempSrc: Oral  Weight: 160 lb 9.6 oz (72.8 kg)  Height: 6' (1.829 m)   Body mass index is 21.78 kg/m.  Physical Exam  GENERAL APPEARANCE: Well nourished. In no acute distress. Normal body habitus SKIN:  Skin is warm and dry.  MOUTH and THROAT: Lips are without lesions. Oral mucosa is moist and without lesions.  Tongue is normal in shape, size, and color and without lesions RESPIRATORY: Breathing is even & unlabored, BS CTAB CARDIAC: RRR, no murmur,no extra heart sounds, no edema GI: Abdomen soft, normal BS, no masses, no tenderness NEUROLOGICAL: There is no tremor. Speech is clear. Has left-sided weakness. PSYCHIATRIC:  Affect and behavior are appropriate  Labs reviewed: Recent Labs    01/10/20 0337 01/10/20 0337 01/11/20 0256 01/12/20 0308 05/05/20 0000  NA 137   < > 137 137 146  K 4.2   < > 4.0 4.0 3.8  CL 104   < > 104 105 109*  CO2 27   < > 23 23 24*  GLUCOSE 111*  --  118* 143*  --   BUN 23   < > 23 25* 13  CREATININE 1.39*   < > 1.35* 1.29* 0.9  CALCIUM 8.3*   < > 7.9* 8.4* 9.0   < > = values in this interval not displayed.   Recent Labs    01/07/20 1814 01/08/20 0540 01/12/20 0308 05/05/20 0000  AST 20  --  24 29  ALT 24  --  21 20  ALKPHOS 74  --  76 256*  BILITOT 1.9* 1.8* 0.9  --   PROT 6.3*  --  6.0*  --   ALBUMIN 3.2*  --  2.6* 3.3*   Recent Labs    01/07/20 1814 01/07/20 1814 01/07/20 1835 01/10/20 0337 05/05/20 0000  WBC 10.4  --   --  8.0 8.6  NEUTROABS 7.8*  --   --   --  5  HGB 14.8   < > 14.6 14.2 12.4*  HCT 45.4   < > 43.0 41.9 38*  MCV 93.0  --   --  90.3  --   PLT 151  --   --  142* 166   < > = values in this interval not displayed.   Lab Results  Component Value Date   TSH 2.36 05/05/2020   Lab Results  Component Value Date   HGBA1C 5.5 01/09/2020   Lab Results  Component Value Date   CHOL 169 01/09/2020   HDL 34 (L) 01/09/2020   LDLCALC 119 (H) 01/09/2020   TRIG 80 01/09/2020   CHOLHDL 5.0 01/09/2020    Assessment/Plan  1. Hypernatremia Lab Results  Component Value Date   NA 146 05/05/2020   K 3.8 05/05/2020   CO2 24 (A) 05/05/2020   GLUCOSE 143 (H) 01/12/2020   BUN 13 05/05/2020   CREATININE 0.9 05/05/2020   CALCIUM 9.0 05/05/2020   GFRNONAA 80.19 05/05/2020   GFRAA 90 05/05/2020   - will repeat Na level  2.  History of CVA (cerebrovascular accident) -Stable, aspirin and atorvastatin  3. Mixed hyperlipidemia Lab Results  Component Value Date   CHOL 169 01/09/2020   HDL 34 (L) 01/09/2020   LDLCALC 119 (H) 01/09/2020   TRIG  80 01/09/2020   CHOLHDL 5.0 01/09/2020   -  Continue atorvastatin  4. Late onset Alzheimer's disease with behavioral disturbance (HCC) -  Continue memantine and Exelon patch -  Continue supportive care    Family/ staff Communication:   Discussed plan of care with resident and charge nurse.  Labs/tests ordered:  BMP  Goals of care:   Long-term care  Kenard Gower, DNP, FNP-BC [facility] [phone] (Monday-Friday 8:00 a.m. - 5:00 p.m.) [phone] (after hours)

## 2020-07-15 ENCOUNTER — Encounter: Payer: Self-pay | Admitting: Internal Medicine

## 2020-07-15 ENCOUNTER — Non-Acute Institutional Stay (SKILLED_NURSING_FACILITY): Payer: Medicare Other | Admitting: Internal Medicine

## 2020-07-15 DIAGNOSIS — R600 Localized edema: Secondary | ICD-10-CM | POA: Diagnosis not present

## 2020-07-15 DIAGNOSIS — Z86718 Personal history of other venous thrombosis and embolism: Secondary | ICD-10-CM | POA: Diagnosis not present

## 2020-07-15 DIAGNOSIS — I63421 Cerebral infarction due to embolism of right anterior cerebral artery: Secondary | ICD-10-CM | POA: Diagnosis not present

## 2020-07-15 NOTE — Assessment & Plan Note (Addendum)
Clinical status is unchanged from date of admission to the SNF.

## 2020-07-15 NOTE — Assessment & Plan Note (Signed)
D-dimer, uric acid, BNP

## 2020-07-15 NOTE — Assessment & Plan Note (Signed)
Check Ddimer

## 2020-07-15 NOTE — Patient Instructions (Signed)
See assessment and plan under each diagnosis in the problem list and acutely for this visit 

## 2020-07-15 NOTE — Progress Notes (Signed)
   NURSING HOME LOCATION:  [facility] ROOM NUMBER:  103-A  CODE STATUS:  DNR  PCP:  Burton Apley, MD  [address] [place] [address]  This is a nursing facility follow up for specific acute issue of localized edema of the left lower extremity.  Interim medical record and care since last [facility] visit was updated with review of diagnostic studies and change in clinical status since last visit were documented.  HPI: Staff reported swelling of the left foot over the last few days.  The exact date of onset is unclear.  Patient is not able provide any history due to his advanced dementia. Significantly he has had a history of DVT; his wife believes it may have been in January of this year.  Review of systems: His profound dementia precluded any meaningful history.  Physical exam:  Pertinent or positive findings: He appears his stated age.  He sits in the wheelchair staring blankly.  When addressed he will often look away and never focuses on the examiner.  He was totally nonverbal except for unintelligible sounds.  Chest was clear without rales or other abnormal breath sounds.  Rhythm was regular and slow.  Left lower extremity is in a brace.  He has trace edema of the right ankle and possibly one half plus on the left.  Denna Haggard' sign appears to be negative.  Dorsalis pedis pulses are stronger than the posterior tibial pulses.  There was very minimal erythema over the base of the right great toe.  This did blanch.  Clinically there was no evidence of cellulitis or acute gout.  General appearance: Adequately nourished; no acute distress, increased work of breathing is present.   Lymphatic: No lymphadenopathy about the head, neck, axilla. Eyes: No conjunctival inflammation or lid edema is present. There is no scleral icterus. Ears:  External ear exam shows no significant lesions or deformities.   Nose:  External nasal examination shows no deformity or inflammation.  Nasal mucosa are pink and moist without lesions, exudates Oral exam:  Lips and gums are healthy appearing. Neck:  No thyromegaly, masses, tenderness noted.    Heart:  No murmur, click, rub .  Lungs:  without wheezes, rhonchi, rales, rubs. Abdomen: Bowel sounds are normal. Abdomen is soft and nontender with no organomegaly, hernias, masses. GU: Deferred  Extremities:  No cyanosis, clubbing Neurologic exam :Balance, Rhomberg, finger to nose testing could not be completed due to clinical state Skin: Warm & dry w/o tenting. No significant lesions or rash.  See summary under each active problem in the Problem List with associated updated therapeutic plan

## 2020-07-18 LAB — CBC AND DIFFERENTIAL
HCT: 36 — AB (ref 41–53)
Hemoglobin: 11.9 — AB (ref 13.5–17.5)
Neutrophils Absolute: 4
Platelets: 167 (ref 150–399)
WBC: 6.7

## 2020-07-18 LAB — CBC: RBC: 3.83 — AB (ref 3.87–5.11)

## 2020-07-22 ENCOUNTER — Encounter: Payer: Self-pay | Admitting: Adult Health

## 2020-07-22 ENCOUNTER — Non-Acute Institutional Stay (SKILLED_NURSING_FACILITY): Payer: Medicare Other | Admitting: Adult Health

## 2020-07-22 DIAGNOSIS — G301 Alzheimer's disease with late onset: Secondary | ICD-10-CM

## 2020-07-22 DIAGNOSIS — R7989 Other specified abnormal findings of blood chemistry: Secondary | ICD-10-CM | POA: Diagnosis not present

## 2020-07-22 DIAGNOSIS — Z8673 Personal history of transient ischemic attack (TIA), and cerebral infarction without residual deficits: Secondary | ICD-10-CM

## 2020-07-22 DIAGNOSIS — R634 Abnormal weight loss: Secondary | ICD-10-CM

## 2020-07-22 DIAGNOSIS — F0280 Dementia in other diseases classified elsewhere without behavioral disturbance: Secondary | ICD-10-CM

## 2020-07-22 NOTE — Progress Notes (Signed)
Location:  [facility] Room Number: 103-A Place of Service:  SNF (31) Provider:  Kenard Gower, DNP, FNP-BC  Patient Care Team: Burton Apley, MD as PCP - General (Internal Medicine)  Extended Emergency Contact Information Primary Emergency Contact: Taber, Sweetser Address: [address]          [place] [address] [place] Home Phone: [phone] Mobile Phone: [phone] Relation: Spouse Secondary Emergency Contact: Jovin, Fester Mobile Phone: [phone] Relation: Son  Code Status:  DNR  Goals of care: Advanced Directive information Advanced Directives 07/15/2020  Does Patient Have a Medical Advance Directive? Yes  Type of Estate agent of [place];Out of facility DNR (pink MOST or yellow form)  Does patient want to make changes to medical advance directive? No - Patient declined  Copy of Healthcare Power of Attorney in Chart? Yes - validated most recent copy scanned in chart (See row information)  Pre-existing out of facility DNR order (yellow form or pink MOST form) Yellow form placed in chart (order not valid for inpatient use)     Chief Complaint  Patient presents with   Acute Visit    Patient is seen for an elevated D. dimer    HPI:  Pt is an 84 y.o. male seen today for an elevated d-dimer of 1,170. He is a long-term care resident of [facility]. He has a PMH of advanced dementia, history of COVID-19 infection on 2/21, history of CVA and history of DVT. Left foot noted with only trace edema. He has LLE weakness with contraction on the left knee. Venous doppler ultrasound of LLE was done today and result still pending. He is nonverbal. BNP 170, uric acid 4.3, done 07/18/20. No SOB was noted. Talked to son, Toney Sang, and informed him about lab result that resident does not have gout.    Past Medical History:  Diagnosis Date   Alzheimer disease (HCC) 09/25/2019   Past  Surgical History:  Procedure Laterality Date   APPENDECTOMY     BACK SURGERY     CHOLECYSTECTOMY     HERNIA REPAIR      Allergies  Allergen Reactions   Demerol [Meperidine] Nausea And Vomiting    Outpatient Encounter Medications as of 07/22/2020  Medication Sig   acetaminophen (TYLENOL) 325 MG tablet Take 650 mg by mouth every 6 (six) hours as needed.   aspirin EC 81 MG EC tablet Take 1 tablet (81 mg total) by mouth daily.   atorvastatin (LIPITOR) 40 MG tablet Take 1 tablet (40 mg total) by mouth daily at 6 PM.   magnesium hydroxide (MILK OF MAGNESIA) 400 MG/5ML suspension If no BM in 3 days, give 30 cc Milk of Magnesium p.o. x 1 dose in 24 hours as needed (Do not use standing constipation orders for residents with renal failure CFR less than 30. Contact MD for orders) (Physician Order)   memantine (NAMENDA) 10 MG tablet Take 10 mg by mouth 2 (two) times daily.    Nutritional Supplement LIQD Take 120 mLs by mouth in the morning, at noon, and at bedtime. MedPass   rivastigmine (EXELON) 9.5 mg/24hr Place 9.5 mg onto the skin daily.   senna-docusate (SENOKOT-S) 8.6-50 MG tablet Take 1 tablet by mouth at bedtime as needed for mild constipation.   No facility-administered encounter medications on file as of 07/22/2020.    Review of Systems  Unable to obtain due to dementia   Immunization History  Administered Date(s) Administered   Influenza-Unspecified  09/27/2019   Janssen (J&J) SARS-COV-2 Vaccination 05/05/2020   PPD Test 01/09/2020   Pertinent  Health Maintenance Due  Topic Date Due   PNA vac Low Risk Adult (1 of 2 - PCV13) 02/24/2021 (Originally 01/04/2003)   INFLUENZA VACCINE  07/27/2020    Vitals:   07/22/20 1055  BP: (!) 111/64  Pulse: 74  Resp: 18  Temp: 97.7 F (36.5 C)  TempSrc: Oral  Weight: 155 lb 6.4 oz (70.5 kg)  Height: 6' (1.829 m)   Body mass index is 21.08 kg/m.  Physical Exam  GENERAL APPEARANCE: Well nourished. In no acute  distress. Normal body habitus SKIN:  Skin is warm and dry.  MOUTH and THROAT: Lips are without lesions. Oral mucosa is moist and without lesions.  RESPIRATORY: Breathing is even & unlabored, BS CTAB CARDIAC: RRR, no murmur,no extra heart sounds, LLE trace edema GI: Abdomen soft, normal BS, no masses, no tenderness EXTREMITIES:  Left knee contracted NEUROLOGICAL: There is no tremor. Nonverbal. PSYCHIATRIC:  Affect and behavior are appropriate  Labs reviewed: Recent Labs    01/10/20 0337 01/10/20 0337 01/11/20 0256 01/11/20 0256 01/12/20 0308 05/05/20 0000 07/09/20 0000  NA 137   < > 137   < > 137 146 144  K 4.2   < > 4.0   < > 4.0 3.8 3.8  CL 104   < > 104   < > 105 109* 107  CO2 27   < > 23   < > 23 24* 27*  GLUCOSE 111*  --  118*  --  143*  --   --   BUN 23   < > 23   < > 25* 13 19  CREATININE 1.39*   < > 1.35*   < > 1.29* 0.9 0.9  CALCIUM 8.3*   < > 7.9*   < > 8.4* 9.0 8.9   < > = values in this interval not displayed.   Recent Labs    01/07/20 1814 01/08/20 0540 01/12/20 0308 05/05/20 0000  AST 20  --  24 29  ALT 24  --  21 20  ALKPHOS 74  --  76 256*  BILITOT 1.9* 1.8* 0.9  --   PROT 6.3*  --  6.0*  --   ALBUMIN 3.2*  --  2.6* 3.3*   Recent Labs    01/07/20 1814 01/07/20 1835 01/10/20 0337 05/05/20 0000 07/18/20 0000  WBC 10.4   < > 8.0 8.6 6.7  NEUTROABS 7.8*  --   --  5 4  HGB 14.8   < > 14.2 12.4* 11.9*  HCT 45.4   < > 41.9 38* 36*  MCV 93.0  --  90.3  --   --   PLT 151   < > 142* 166 167   < > = values in this interval not displayed.   Lab Results  Component Value Date   TSH 2.36 05/05/2020   Lab Results  Component Value Date   HGBA1C 5.5 01/09/2020   Lab Results  Component Value Date   CHOL 169 01/09/2020   HDL 34 (L) 01/09/2020   LDLCALC 119 (H) 01/09/2020   TRIG 80 01/09/2020   CHOLHDL 5.0 01/09/2020    Assessment/Plan  1. Positive D dimer - d-dimer 1,170, elevated - LLE venous doppler ultrasound done -  Result showed negative  DVT -  Since the d-dimer was elevated, which is a diagnostic for a PE and DVT, he will be started on Eliquis 10 mg  BID X 1 week then Eliquis 5 mg BID X 5 weeks then D-dimer will be repeated   2. History of CVA (cerebrovascular accident) - has LLE weakness -  Continue ASA and Atorvastatin  3. Weight loss Wt Readings from Last 3 Encounters:  07/22/20 155 lb 6.4 oz (70.5 kg)  07/15/20 152 lb (68.9 kg)  07/09/20 160 lb 9.6 oz (72.8 kg)   -  Will add magic cup BID and continue medpass 120 ml BID  4. Late onset Alzheimer's disease without behavioral disturbance (HCC) - continue Memantine and Rivastigmine - continue supportive care      Family/ staff Communication:  Discussed plan of care with son and charge nurse.  Labs/tests ordered:  D-dimer in 7 weeks and CBC on 07/28/20  Goals of care:   Long-term care    Kenard Gower, DNP, FNP-BC [facility] [phone] (Monday-Friday 8:00 a.m. - 5:00 p.m.) [phone] (after hours)

## 2020-07-31 ENCOUNTER — Encounter: Payer: Self-pay | Admitting: Adult Health

## 2020-07-31 ENCOUNTER — Non-Acute Institutional Stay (SKILLED_NURSING_FACILITY): Payer: Medicare Other | Admitting: Adult Health

## 2020-07-31 DIAGNOSIS — R7989 Other specified abnormal findings of blood chemistry: Secondary | ICD-10-CM

## 2020-07-31 DIAGNOSIS — I10 Essential (primary) hypertension: Secondary | ICD-10-CM | POA: Diagnosis not present

## 2020-07-31 NOTE — Progress Notes (Signed)
Location:  [facility] Room Number: 103-A Place of Service:  SNF (31) Provider:  Kenard Gower, DNP, FNP-BC  Patient Care Team: Burton Apley, MD as PCP - General (Internal Medicine)  Extended Emergency Contact Information Primary Emergency Contact: Kyle [name] Address: [address]          [place] [address] Darden [place] of [place] Home Phone: [phone] Mobile Phone: [phone] Relation: Spouse Secondary Emergency Contact: [name] Mobile Phone: [phone] Relation: Son  Code Status:  DNR  Goals of care: Advanced Directive information Advanced Directives 07/31/2020  Does Patient Have a Medical Advance Directive? Yes  Type of Estate agent of [place];Out of facility DNR (pink MOST or yellow form)  Does patient want to make changes to medical advance directive? No - Guardian declined  Copy of Healthcare Power of Attorney in Chart? Yes - validated most recent copy scanned in chart (See row information)  Pre-existing out of facility DNR order (yellow form or pink MOST form) Yellow form placed in chart (order not valid for inpatient use)     Chief Complaint  Patient presents with   Acute Visit    Patient is seen for re-evaluation of D-dimer due to insurance not wanting to cover Eliquis    HPI:  Pt is an 84 y.o. [name] seen today for an acute visit to reevaluate need for Eliquis.  He is a long-[facility] care resident of [name].  He has a PMH of advanced dementia.  On 07/22/2020, he was started on Eliquis due to an elevated D-dimer 1170, D-dimer was obtained due to left foot edema.  The edema on the left foot is now resolved.  L elbow pain venous Doppler done on 07/22/2020 was negative for DVT.  D-dimer obtained on 07/30/20 was 0.5, within normal range.  Left foot no longer edema and nontender.  Wife stated that Eliquis is not covered by their health insurance.  Resident has no reported SOB.   SBP's ranged from 125-154, elevated.  He is not taking antihypertensive medication.   Past Medical History:  Diagnosis Date   Alzheimer disease (HCC) 09/25/2019   Past Surgical History:  Procedure Laterality Date   APPENDECTOMY     BACK SURGERY     CHOLECYSTECTOMY     HERNIA REPAIR      Allergies  Allergen Reactions   Demerol [Meperidine] Nausea And Vomiting    Outpatient Encounter Medications as of 07/31/2020  Medication Sig   acetaminophen (TYLENOL) 325 MG tablet Take 650 mg by mouth every 6 (six) hours as needed.   apixaban (ELIQUIS) 5 MG TABS tablet Take 5 mg by mouth 2 (two) times daily.   aspirin EC 81 MG EC tablet Take 1 tablet (81 mg total) by mouth daily.   atorvastatin (LIPITOR) 40 MG tablet Take 1 tablet (40 mg total) by mouth daily at 6 PM.   magnesium hydroxide (MILK OF MAGNESIA) 400 MG/5ML suspension If no BM in 3 days, give 30 cc Milk of Magnesium p.o. x 1 dose in 24 hours as needed (Do not use standing constipation orders for residents with renal failure CFR less than 30. Contact MD for orders) (Physician Order)   memantine (NAMENDA) 10 MG tablet Take 10 mg by mouth 2 (two) times daily.    Nutritional Supplement LIQD Take 120 mLs by mouth in the morning, at noon, and at bedtime. MedPass   Nutritional Supplements (NUTRITIONAL SUPPLEMENT PO) Take 1 each by mouth in the morning and at  bedtime.   rivastigmine (EXELON) 9.5 mg/24hr Place 9.5 mg onto the skin daily.   senna-docusate (SENOKOT-S) 8.6-50 MG tablet Take 1 tablet by mouth at bedtime as needed for mild constipation.   No facility-administered encounter medications on file as of 07/31/2020.    Review of Systems  Unable to obtain due to dementia   Immunization History  Administered Date(s) Administered   Influenza-Unspecified 09/27/2019   Janssen (J&J) SARS-COV-2 Vaccination 05/05/2020   PPD Test 01/09/2020   Pertinent  Health Maintenance Due  Topic Date Due   INFLUENZA VACCINE   07/27/2020   PNA vac Low Risk Adult (1 of 2 - PCV13) 02/24/2021 (Originally 01/04/2003)    Vitals:   07/31/20 1253  BP: 127/68  Pulse: 61  Resp: 20  Temp: (!) 97.2 F (36.2 C)  TempSrc: Oral  Weight: 153 lb 6.4 oz (69.6 kg)  Height: 6' (1.829 m)   Body mass index is 20.8 kg/m.  Physical Exam  GENERAL APPEARANCE: Well nourished. In no acute distress. Normal body habitus SKIN:  Skin is warm and dry.  MOUTH and THROAT: Lips are without lesions. Oral mucosa is moist and without lesions.  RESPIRATORY: Breathing is even & unlabored, BS CTAB CARDIAC: RRR, no murmur,no extra heart sounds, no edema GI: Abdomen soft, normal BS, no masses, no tenderness EXTREMITIES:  Able to move BUE and RLE. Left knee contracted NEUROLOGICAL: There is no tremor. Nonverbal. PSYCHIATRIC:  Affect and behavior are appropriate  Labs reviewed: Recent Labs    01/10/20 0337 01/10/20 0337 01/11/20 0256 01/11/20 0256 01/12/20 0308 05/05/20 0000 07/09/20 0000  NA 137   < > 137   < > 137 146 144  K 4.2   < > 4.0   < > 4.0 3.8 3.8  CL 104   < > 104   < > 105 109* 107  CO2 27   < > 23   < > 23 24* 27*  GLUCOSE 111*  --  118*  --  143*  --   --   BUN 23   < > 23   < > 25* 13 19  CREATININE 1.39*   < > 1.35*   < > 1.29* 0.9 0.9  CALCIUM 8.3*   < > 7.9*   < > 8.4* 9.0 8.9   < > = values in this interval not displayed.   Recent Labs    01/07/20 1814 01/08/20 0540 01/12/20 0308 05/05/20 0000  AST 20  --  24 29  ALT 24  --  21 20  ALKPHOS 74  --  76 256*  BILITOT 1.9* 1.8* 0.9  --   PROT 6.3*  --  6.0*  --   ALBUMIN 3.2*  --  2.6* 3.3*   Recent Labs    01/07/20 1814 01/07/20 1835 01/10/20 0337 05/05/20 0000 07/18/20 0000  WBC 10.4   < > 8.0 8.6 6.7  NEUTROABS 7.8*  --   --  5 4  HGB 14.8   < > 14.2 12.4* 11.9*  HCT 45.4   < > 41.9 38* 36*  MCV 93.0  --  90.3  --   --   PLT 151   < > 142* 166 167   < > = values in this interval not displayed.   Lab Results  Component Value Date   TSH  2.36 05/05/2020   Lab Results  Component Value Date   HGBA1C 5.5 01/09/2020   Lab Results  Component Value Date   CHOL  169 01/09/2020   HDL 34 (L) 01/09/2020   LDLCALC 119 (H) 01/09/2020   TRIG 80 01/09/2020   CHOLHDL 5.0 01/09/2020    Assessment/Plan  1. Positive D dimer - d-dimer now down to 0.5 from 1,170 -  Was started on Eliquis on 07/22/20 -  Will discontinue Eliquis since D-dimer is now normal and no edema  2. Uncontrolled hypertension - BPs elevated, will start on Losartan 25 mg daily and monitor BPs    Family/ staff Communication:  Discussed plan of care with charge nurse.  Labs/tests ordered:  None  Goals of care:   Long-term care   Kenard Gower, DNP, MSN, FNP-BC [facility] [phone] (Monday-Friday 8:00 a.m. - 5:00 p.m.) [phone] (after hours)

## 2020-08-05 ENCOUNTER — Non-Acute Institutional Stay (SKILLED_NURSING_FACILITY): Payer: Medicare Other | Admitting: Adult Health

## 2020-08-05 ENCOUNTER — Encounter: Payer: Self-pay | Admitting: Adult Health

## 2020-08-05 DIAGNOSIS — E782 Mixed hyperlipidemia: Secondary | ICD-10-CM

## 2020-08-05 DIAGNOSIS — F02818 Dementia in other diseases classified elsewhere, unspecified severity, with other behavioral disturbance: Secondary | ICD-10-CM

## 2020-08-05 DIAGNOSIS — Z8673 Personal history of transient ischemic attack (TIA), and cerebral infarction without residual deficits: Secondary | ICD-10-CM

## 2020-08-05 DIAGNOSIS — F0281 Dementia in other diseases classified elsewhere with behavioral disturbance: Secondary | ICD-10-CM

## 2020-08-05 DIAGNOSIS — G301 Alzheimer's disease with late onset: Secondary | ICD-10-CM

## 2020-08-05 DIAGNOSIS — I10 Essential (primary) hypertension: Secondary | ICD-10-CM

## 2020-08-05 NOTE — Progress Notes (Signed)
Location:  [facility] Room Number: 103-A Place of Service:  SNF (31) Provider:  Kenard Gower, DNP, FNP-BC  Patient Care Team: Burton Apley, MD as PCP - General (Internal Medicine)  Extended Emergency Contact Information Primary Emergency Contact: Maddox, Hlavaty Address: [address]          [place] [address] [place] of [place] Home Phone: [phone] Mobile Phone: [phone] Relation: Spouse Secondary Emergency Contact: Osborne, Serio Mobile Phone: [phone] Relation: Son  Code Status:  DNR  Goals of care: Advanced Directive information Advanced Directives 07/31/2020  Does Patient Have a Medical Advance Directive? Yes  Type of Estate agent of [place];Out of facility DNR (pink MOST or yellow form)  Does patient want to make changes to medical advance directive? No - Guardian declined  Copy of Healthcare Power of Attorney in Chart? Yes - validated most recent copy scanned in chart (See row information)  Pre-existing out of facility DNR order (yellow form or pink MOST form) Yellow form placed in chart (order not valid for inpatient use)     Chief Complaint  Patient presents with  . Medical Management of Chronic Issues    Routine [facility] visit    HPI:  Pt is an 84 y.o. male seen today for medical management of chronic diseases.  He is a long-term care resident of [facility].  He has a PMH of advanced dementia. His Eliquis was recently discontinued due to D-dimer improvement to within normal, from 1117 to 0.5  . No edema on bilateral feet.  On 07/31/20, she was started on losartan due to elevated BPs.  BP today 144/78.   Past Medical History:  Diagnosis Date  . Alzheimer disease (HCC) 09/25/2019   Past Surgical History:  Procedure Laterality Date  . APPENDECTOMY    . BACK SURGERY    . CHOLECYSTECTOMY    . HERNIA REPAIR      Allergies  Allergen Reactions  . Demerol  [Meperidine] Nausea And Vomiting    Outpatient Encounter Medications as of 08/05/2020  Medication Sig  . magnesium hydroxide (MILK OF MAGNESIA) 400 MG/5ML suspension If no BM in 3 days, give 30 cc Milk of Magnesium p.o. x 1 dose in 24 hours as needed (Do not use standing constipation orders for residents with renal failure CFR less than 30. Contact MD for orders) (Physician Order)  . acetaminophen (TYLENOL) 325 MG tablet Take 650 mg by mouth every 6 (six) hours as needed.  Marland Kitchen aspirin EC 81 MG EC tablet Take 1 tablet (81 mg total) by mouth daily.  Marland Kitchen atorvastatin (LIPITOR) 40 MG tablet Take 1 tablet (40 mg total) by mouth daily at 6 PM.  . memantine (NAMENDA) 10 MG tablet Take 10 mg by mouth 2 (two) times daily.   . Nutritional Supplement LIQD Take 120 mLs by mouth in the morning, at noon, and at bedtime. MedPass  . Nutritional Supplements (NUTRITIONAL SUPPLEMENT PO) Take 1 each by mouth in the morning and at bedtime.  . rivastigmine (EXELON) 9.5 mg/24hr Place 9.5 mg onto the skin daily.  Marland Kitchen senna-docusate (SENOKOT-S) 8.6-50 MG tablet Take 1 tablet by mouth at bedtime as needed for mild constipation.  . [DISCONTINUED] apixaban (ELIQUIS) 5 MG TABS tablet Take 5 mg by mouth 2 (two) times daily.   No facility-administered encounter medications on file as of 08/05/2020.    Review of Systems unable to obtain due to dementia   Immunization History  Administered Date(s) Administered  .  Influenza-Unspecified 09/27/2019  . Janssen (J&J) SARS-COV-2 Vaccination 05/05/2020  . PPD Test 01/09/2020   Pertinent  Health Maintenance Due  Topic Date Due  . INFLUENZA VACCINE  07/27/2020  . PNA vac Low Risk Adult (1 of 2 - PCV13) 02/24/2021 (Originally 01/04/2003)    Vitals:   08/05/20 1516  BP: 135/67  Pulse: 61  Resp: 20  Temp: 97.7 F (36.5 C)  TempSrc: Oral  Weight: 153 lb 1.6 oz (69.4 kg)  Height: 6' (1.829 m)   Body mass index is 20.76 kg/m.  Physical Exam  GENERAL APPEARANCE: Well  nourished. In no acute distress. Normal body habitus SKIN:  Skin is warm and dry.  MOUTH and THROAT: Lips are without lesions. Oral mucosa is moist and without lesions.  RESPIRATORY: Breathing is even & unlabored, BS CTAB CARDIAC: RRR, no murmur,no extra heart sounds, no edema GI: Abdomen soft, normal BS, no masses, no tenderness EXTREMITIES:  Left knee contracted NEUROLOGICAL: There is no tremor. Nonverbal PSYCHIATRIC:  Affect and behavior are appropriate  Labs reviewed: Recent Labs    01/10/20 0337 01/10/20 0337 01/11/20 0256 01/11/20 0256 01/12/20 0308 05/05/20 0000 07/09/20 0000  NA 137   < > 137   < > 137 146 144  K 4.2   < > 4.0   < > 4.0 3.8 3.8  CL 104   < > 104   < > 105 109* 107  CO2 27   < > 23   < > 23 24* 27*  GLUCOSE 111*  --  118*  --  143*  --   --   BUN 23   < > 23   < > 25* 13 19  CREATININE 1.39*   < > 1.35*   < > 1.29* 0.9 0.9  CALCIUM 8.3*   < > 7.9*   < > 8.4* 9.0 8.9   < > = values in this interval not displayed.   Recent Labs    01/07/20 1814 01/08/20 0540 01/12/20 0308 05/05/20 0000  AST 20  --  24 29  ALT 24  --  21 20  ALKPHOS 74  --  76 256*  BILITOT 1.9* 1.8* 0.9  --   PROT 6.3*  --  6.0*  --   ALBUMIN 3.2*  --  2.6* 3.3*   Recent Labs    01/07/20 1814 01/07/20 1835 01/10/20 0337 05/05/20 0000 07/18/20 0000  WBC 10.4   < > 8.0 8.6 6.7  NEUTROABS 7.8*  --   --  5 4  HGB 14.8   < > 14.2 12.4* 11.9*  HCT 45.4   < > 41.9 38* 36*  MCV 93.0  --  90.3  --   --   PLT 151   < > 142* 166 167   < > = values in this interval not displayed.   Lab Results  Component Value Date   TSH 2.36 05/05/2020   Lab Results  Component Value Date   HGBA1C 5.5 01/09/2020   Lab Results  Component Value Date   CHOL 169 01/09/2020   HDL 34 (L) 01/09/2020   LDLCALC 119 (H) 01/09/2020   TRIG 80 01/09/2020   CHOLHDL 5.0 01/09/2020    Assessment/Plan  1. Essential hypertension -  BP 144/78, continue Losartan  2. History of CVA (cerebrovascular  accident) - stable, continue aspirin and atorvastatin  3. Mixed hyperlipidemia Lab Results  Component Value Date   CHOL 169 01/09/2020   HDL 34 (L) 01/09/2020  LDLCALC 119 (H) 01/09/2020   TRIG 80 01/09/2020   CHOLHDL 5.0 01/09/2020   -Continue atorvastatin  4. Late onset Alzheimer's disease with behavioral disturbance (HCC) -Continue memantine and rivastigmine -Continue supportive care/fall precautions     Family/ staff Communication:  Discussed plan of care with resident and charge nurse.  Labs/tests ordered:  None  Goals of care:   Long-term care   Kenard Gower, DNP, MSN, FNP-BC [facility] and Adult [facility] [phone] (Monday-Friday 8:00 a.m. - 5:00 p.m.) [phone] (after hours)

## 2020-09-02 ENCOUNTER — Non-Acute Institutional Stay (SKILLED_NURSING_FACILITY): Payer: Medicare Other | Admitting: Adult Health

## 2020-09-02 ENCOUNTER — Encounter: Payer: Self-pay | Admitting: Adult Health

## 2020-09-02 DIAGNOSIS — Z8673 Personal history of transient ischemic attack (TIA), and cerebral infarction without residual deficits: Secondary | ICD-10-CM | POA: Diagnosis not present

## 2020-09-02 DIAGNOSIS — G301 Alzheimer's disease with late onset: Secondary | ICD-10-CM | POA: Diagnosis not present

## 2020-09-02 DIAGNOSIS — F0281 Dementia in other diseases classified elsewhere with behavioral disturbance: Secondary | ICD-10-CM

## 2020-09-02 DIAGNOSIS — E782 Mixed hyperlipidemia: Secondary | ICD-10-CM

## 2020-09-02 DIAGNOSIS — F02818 Dementia in other diseases classified elsewhere, unspecified severity, with other behavioral disturbance: Secondary | ICD-10-CM

## 2020-09-02 DIAGNOSIS — I10 Essential (primary) hypertension: Secondary | ICD-10-CM | POA: Diagnosis not present

## 2020-09-02 NOTE — Progress Notes (Signed)
Location:  [facility] Room Number: 103-A Place of Service:  SNF (31) Provider:  Kenard Gower, DNP, FNP-BC  Patient Care Team: Burton Apley, MD as PCP - General (Internal Medicine)  Extended Emergency Contact Information Primary Emergency Contact: Breland, Trouten Address: [address]          [place] [address] [place] of [place] Home Phone: [phone] Mobile Phone: [phone] Relation: Spouse Secondary Emergency Contact: Jenna, Ardoin Mobile Phone: [phone] Relation: Son  Code Status:  DNR  Goals of care: Advanced Directive information Advanced Directives 09/02/2020  Does Patient Have a Medical Advance Directive? Yes  Type of Estate agent of [place];Out of facility DNR (pink MOST or yellow form)  Does patient want to make changes to medical advance directive? No - Patient declined  Copy of Healthcare Power of Attorney in Chart? Yes - validated most recent copy scanned in chart (See row information)  Pre-existing out of facility DNR order (yellow form or pink MOST form) Yellow form placed in chart (order not valid for inpatient use)     Chief Complaint  Patient presents with  . Medical Management of Chronic Issues    Routine [facility] visit    HPI:  Pt is an 84 y.o. male seen today for medical management of chronic diseases. He is a long-term care resident of [facility]. He has a PMH of advanced dementia. Has left-sided weakness due to history of CVA. He takes on stating EC 81 mg daily and atorvastatin 40 mg daily for history of CVA.  SBPs ranging from 114-139, with outlier 160.  He takes losartan 25 mg daily for hypertension. His wife comes for compassionate visits.   Past Medical History:  Diagnosis Date  . Alzheimer disease (HCC) 09/25/2019   Past Surgical History:  Procedure Laterality Date  . APPENDECTOMY    . BACK SURGERY    . CHOLECYSTECTOMY    . HERNIA REPAIR       Allergies  Allergen Reactions  . Demerol [Meperidine] Nausea And Vomiting    Outpatient Encounter Medications as of 09/02/2020  Medication Sig  . acetaminophen (TYLENOL) 325 MG tablet Take 650 mg by mouth every 6 (six) hours as needed.  Marland Kitchen aspirin EC 81 MG EC tablet Take 1 tablet (81 mg total) by mouth daily.  Marland Kitchen atorvastatin (LIPITOR) 40 MG tablet Take 1 tablet (40 mg total) by mouth daily at 6 PM.  . losartan (COZAAR) 25 MG tablet Take 25 mg by mouth daily.  . magnesium hydroxide (MILK OF MAGNESIA) 400 MG/5ML suspension If no BM in 3 days, give 30 cc Milk of Magnesium p.o. x 1 dose in 24 hours as needed (Do not use standing constipation orders for residents with renal failure CFR less than 30. Contact MD for orders) (Physician Order)  . memantine (NAMENDA) 10 MG tablet Take 10 mg by mouth 2 (two) times daily.   . Nutritional Supplement LIQD Take 120 mLs by mouth in the morning, at noon, and at bedtime. MedPass  . Nutritional Supplements (NUTRITIONAL SUPPLEMENT PO) Take 1 each by mouth in the morning and at bedtime. Magic Cup  . rivastigmine (EXELON) 9.5 mg/24hr Place 9.5 mg onto the skin daily.  Marland Kitchen senna-docusate (SENOKOT-S) 8.6-50 MG tablet Take 1 tablet by mouth at bedtime as needed for mild constipation.   No facility-administered encounter medications on file as of 09/02/2020.    Review of Systems  Unable to obtain due to dementia.    Immunization  History  Administered Date(s) Administered  . Influenza-Unspecified 09/27/2019  . Janssen (J&J) SARS-COV-2 Vaccination 05/05/2020  . PPD Test 01/09/2020   Pertinent  Health Maintenance Due  Topic Date Due  . PNA vac Low Risk Adult (1 of 2 - PCV13) 02/24/2021 (Originally 01/04/2003)  . INFLUENZA VACCINE  03/26/2021 (Originally 07/27/2020)    Vitals:   09/02/20 1238  BP: (!) 129/57  Pulse: 63  Resp: 20  Temp: (!) 97.2 F (36.2 C)  TempSrc: Oral  Weight: 154 lb (69.9 kg)  Height: 6' (1.829 m)   Body mass index is 20.89  kg/m.  Physical Exam  GENERAL APPEARANCE: Well nourished. In no acute distress. Normal body habitus SKIN:  Skin is warm and dry.  MOUTH and THROAT: Lips are without lesions. Oral mucosa is moist and without lesions.  RESPIRATORY: Breathing is even & unlabored, BS CTAB CARDIAC: RRR, no murmur,no extra heart sounds, no edema GI: Abdomen soft, normal BS, no masses, no tenderness NEUROLOGICAL: There is no tremor. Speech is clear. Has left-sided weakness  PSYCHIATRIC:  Affect and behavior are appropriate  Labs reviewed: Recent Labs    01/10/20 0337 01/10/20 0337 01/11/20 0256 01/11/20 0256 01/12/20 0308 05/05/20 0000 07/09/20 0000  NA 137   < > 137   < > 137 146 144  K 4.2   < > 4.0   < > 4.0 3.8 3.8  CL 104   < > 104   < > 105 109* 107  CO2 27   < > 23   < > 23 24* 27*  GLUCOSE 111*  --  118*  --  143*  --   --   BUN 23   < > 23   < > 25* 13 19  CREATININE 1.39*   < > 1.35*   < > 1.29* 0.9 0.9  CALCIUM 8.3*   < > 7.9*   < > 8.4* 9.0 8.9   < > = values in this interval not displayed.   Recent Labs    01/07/20 1814 01/08/20 0540 01/12/20 0308 05/05/20 0000  AST 20  --  24 29  ALT 24  --  21 20  ALKPHOS 74  --  76 256*  BILITOT 1.9* 1.8* 0.9  --   PROT 6.3*  --  6.0*  --   ALBUMIN 3.2*  --  2.6* 3.3*   Recent Labs    01/07/20 1814 01/07/20 1835 01/10/20 0337 05/05/20 0000 07/18/20 0000  WBC 10.4   < > 8.0 8.6 6.7  NEUTROABS 7.8*  --   --  5 4  HGB 14.8   < > 14.2 12.4* 11.9*  HCT 45.4   < > 41.9 38* 36*  MCV 93.0  --  90.3  --   --   PLT 151   < > 142* 166 167   < > = values in this interval not displayed.   Lab Results  Component Value Date   TSH 2.36 05/05/2020   Lab Results  Component Value Date   HGBA1C 5.5 01/09/2020   Lab Results  Component Value Date   CHOL 169 01/09/2020   HDL 34 (L) 01/09/2020   LDLCALC 119 (H) 01/09/2020   TRIG 80 01/09/2020   CHOLHDL 5.0 01/09/2020    Assessment/Plan  1. History of CVA (cerebrovascular accident) -   w/ left-sided weakness - Continue aspirin and atorvastatin supportive care  2. Essential hypertension -   Stable, continue losartan  3. Mixed hyperlipidemia Lab Results  Component Value Date   CHOL 169 01/09/2020   HDL 34 (L) 01/09/2020   LDLCALC 119 (H) 01/09/2020   TRIG 80 01/09/2020   CHOLHDL 5.0 01/09/2020   -   Continue atorvastatin  4. Late onset Alzheimer's disease with behavioral disturbance (HCC) -Continue memantine, rivastigmine and supportive care -Fall precautions    Family/ staff Communication: Discussed plan of care with charge nurse.  Labs/tests ordered: None  Goals of care:   Long-term care   Kenard Gower, DNP, MSN, FNP-BC [facility] and Adult [facility] [phone] (Monday-Friday 8:00 a.m. - 5:00 p.m.) [phone] (after hours)

## 2020-09-29 ENCOUNTER — Encounter: Payer: Self-pay | Admitting: Adult Health

## 2020-09-29 ENCOUNTER — Non-Acute Institutional Stay (SKILLED_NURSING_FACILITY): Payer: Medicare Other | Admitting: Adult Health

## 2020-09-29 DIAGNOSIS — G301 Alzheimer's disease with late onset: Secondary | ICD-10-CM

## 2020-09-29 DIAGNOSIS — I10 Essential (primary) hypertension: Secondary | ICD-10-CM | POA: Diagnosis not present

## 2020-09-29 DIAGNOSIS — E782 Mixed hyperlipidemia: Secondary | ICD-10-CM | POA: Diagnosis not present

## 2020-09-29 DIAGNOSIS — F02818 Dementia in other diseases classified elsewhere, unspecified severity, with other behavioral disturbance: Secondary | ICD-10-CM

## 2020-09-29 DIAGNOSIS — Z8673 Personal history of transient ischemic attack (TIA), and cerebral infarction without residual deficits: Secondary | ICD-10-CM | POA: Diagnosis not present

## 2020-09-29 DIAGNOSIS — F0281 Dementia in other diseases classified elsewhere with behavioral disturbance: Secondary | ICD-10-CM

## 2020-09-29 NOTE — Progress Notes (Signed)
Location:  [facility] Room Number: 103-A Place of Service:  SNF (31) Provider:  Kenard Gower, DNP, FNP-BC  Patient Care Team: Burton Apley, MD as PCP - General (Internal Medicine)  Extended Emergency Contact Information Primary Emergency Contact: Stephanie, Mcglone Address: [address]          [place] [address] D[place] of [place] Home Phone: [phone] Mobile Phone: [phone] Relation: Spouse Secondary Emergency Contact: [place], [place] Mobile Phone: [phone] Relation: Son  Code Status:  DNR   Goals of care: Advanced Directive information Advanced Directives 09/29/2020  Does Patient Have a Medical Advance Directive? Yes  Type of Estate agent of [place];Out of facility DNR (pink MOST or yellow form)  Does patient want to make changes to medical advance directive? No - Patient declined  Copy of Healthcare Power of Attorney in Chart? Yes - validated most recent copy scanned in chart (See row information)  Pre-existing out of facility DNR order (yellow form or pink MOST form) Yellow form placed in chart (order not valid for inpatient use)     Chief Complaint  Patient presents with  . Medical Management of Chronic Issues    Routine [facility] visit    HPI:  Pt is an 84 y.o. male seen today for medical management of chronic diseases.  He is a long-term care resident of [facility].  He has a PMH of advanced dementia, history of COVID-19 infection and history of CVA and DVT. He was seen today. SBPs ranging from 108 to 148.  Currently, he is taking losartan 25 mg daily for hypertension.  He takes atorvastatin 40 mg daily for hyperlipidemia.     Past Medical History:  Diagnosis Date  . Alzheimer disease (HCC) 09/25/2019   Past Surgical History:  Procedure Laterality Date  . APPENDECTOMY    . BACK SURGERY    . CHOLECYSTECTOMY    . HERNIA REPAIR      Allergies  Allergen  Reactions  . Demerol [Meperidine] Nausea And Vomiting    Outpatient Encounter Medications as of 09/29/2020  Medication Sig  . acetaminophen (TYLENOL) 325 MG tablet Take 650 mg by mouth every 6 (six) hours as needed.  Marland Kitchen aspirin EC 81 MG EC tablet Take 1 tablet (81 mg total) by mouth daily.  Marland Kitchen atorvastatin (LIPITOR) 40 MG tablet Take 1 tablet (40 mg total) by mouth daily at 6 PM.  . losartan (COZAAR) 25 MG tablet Take 25 mg by mouth daily.  . magnesium hydroxide (MILK OF MAGNESIA) 400 MG/5ML suspension If no BM in 3 days, give 30 cc Milk of Magnesium p.o. x 1 dose in 24 hours as needed (Do not use standing constipation orders for residents with renal failure CFR less than 30. Contact MD for orders) (Physician Order)  . memantine (NAMENDA) 10 MG tablet Take 10 mg by mouth 2 (two) times daily.   . Nutritional Supplement LIQD Take 120 mLs by mouth in the morning, at noon, and at bedtime. MedPass  . Nutritional Supplements (NUTRITIONAL SUPPLEMENT PO) Take 1 each by mouth in the morning and at bedtime. Magic Cup  . rivastigmine (EXELON) 9.5 mg/24hr Place 9.5 mg onto the skin daily.  Marland Kitchen senna-docusate (SENOKOT-S) 8.6-50 MG tablet Take 1 tablet by mouth at bedtime as needed for mild constipation.   No facility-administered encounter medications on file as of 09/29/2020.    Review of Systems unable to obtain due to dementia    Immunization History  Administered Date(s)  Administered  . Influenza-Unspecified 09/27/2019  . Janssen (J&J) SARS-COV-2 Vaccination 05/05/2020  . PPD Test 01/09/2020   Pertinent  Health Maintenance Due  Topic Date Due  . PNA vac Low Risk Adult (1 of 2 - PCV13) 02/24/2021 (Originally 01/04/2003)  . INFLUENZA VACCINE  03/26/2021 (Originally 07/27/2020)    Vitals:   09/29/20 1238  BP: 130/65  Pulse: (!) 58  Resp: 20  Temp: (!) 97.2 F (36.2 C)  TempSrc: Oral  Weight: 154 lb (69.9 kg)  Height: 6' (1.829 m)   Body mass index is 20.89 kg/m.  Physical Exam  GENERAL  APPEARANCE: Well nourished. In no acute distress. Normal body habitus SKIN:  Skin is warm and dry.  MOUTH and THROAT: Lips are without lesions. Oral mucosa is moist and without lesions.  RESPIRATORY: Breathing is even & unlabored, BS CTAB CARDIAC: RRR, no murmur,no extra heart sounds, no edema GI: Abdomen soft, normal BS, no masses, no tenderness EXTREMITIES:  Left knee contracted NEUROLOGICAL: There is no tremor. Disoriented X3 PSYCHIATRIC:  Affect and behavior are appropriate  Labs reviewed: Recent Labs    01/10/20 0337 01/10/20 0337 01/11/20 0256 01/11/20 0256 01/12/20 0308 05/05/20 0000 07/09/20 0000  NA 137   < > 137   < > 137 146 144  K 4.2   < > 4.0   < > 4.0 3.8 3.8  CL 104   < > 104   < > 105 109* 107  CO2 27   < > 23   < > 23 24* 27*  GLUCOSE 111*  --  118*  --  143*  --   --   BUN 23   < > 23   < > 25* 13 19  CREATININE 1.39*   < > 1.35*   < > 1.29* 0.9 0.9  CALCIUM 8.3*   < > 7.9*   < > 8.4* 9.0 8.9   < > = values in this interval not displayed.   Recent Labs    01/07/20 1814 01/08/20 0540 01/12/20 0308 05/05/20 0000  AST 20  --  24 29  ALT 24  --  21 20  ALKPHOS 74  --  76 256*  BILITOT 1.9* 1.8* 0.9  --   PROT 6.3*  --  6.0*  --   ALBUMIN 3.2*  --  2.6* 3.3*   Recent Labs    01/07/20 1814 01/07/20 1835 01/10/20 0337 05/05/20 0000 07/18/20 0000  WBC 10.4   < > 8.0 8.6 6.7  NEUTROABS 7.8*  --   --  5 4  HGB 14.8   < > 14.2 12.4* 11.9*  HCT 45.4   < > 41.9 38* 36*  MCV 93.0  --  90.3  --   --   PLT 151   < > 142* 166 167   < > = values in this interval not displayed.   Lab Results  Component Value Date   TSH 2.36 05/05/2020   Lab Results  Component Value Date   HGBA1C 5.5 01/09/2020   Lab Results  Component Value Date   CHOL 169 01/09/2020   HDL 34 (L) 01/09/2020   LDLCALC 119 (H) 01/09/2020   TRIG 80 01/09/2020   CHOLHDL 5.0 01/09/2020    Assessment/Plan  1. Mixed hyperlipidemia Lab Results  Component Value Date   CHOL 169  01/09/2020   HDL 34 (L) 01/09/2020   LDLCALC 119 (H) 01/09/2020   TRIG 80 01/09/2020   CHOLHDL 5.0 01/09/2020   -  Continue atorvastatin  2. Essential hypertension -   Stable, continue losartan  3. History of CVA (cerebrovascular accident) -Stable, continue aspirin and atorvastatin  4. Late onset Alzheimer's disease with behavioral disturbance (HCC) -Continue memantine and rivastigmine patch -Continue supportive care and fall precautions     Family/ staff Communication: Discussed plan of care with charge nurse.  Labs/tests ordered: None  Goals of care:   Long-term care   Kenard Gower, DNP, MSN, FNP-BC [facility] [phone] (Monday-Friday 8:00 a.m. - 5:00 p.m.) [phone] (after hours)

## 2020-10-27 ENCOUNTER — Non-Acute Institutional Stay (SKILLED_NURSING_FACILITY): Payer: Medicare Other | Admitting: Adult Health

## 2020-10-27 ENCOUNTER — Encounter: Payer: Self-pay | Admitting: Adult Health

## 2020-10-27 DIAGNOSIS — F0280 Dementia in other diseases classified elsewhere without behavioral disturbance: Secondary | ICD-10-CM

## 2020-10-27 DIAGNOSIS — I6930 Unspecified sequelae of cerebral infarction: Secondary | ICD-10-CM

## 2020-10-27 DIAGNOSIS — M24562 Contracture, left knee: Secondary | ICD-10-CM

## 2020-10-27 DIAGNOSIS — I10 Essential (primary) hypertension: Secondary | ICD-10-CM

## 2020-10-27 DIAGNOSIS — G309 Alzheimer's disease, unspecified: Secondary | ICD-10-CM

## 2020-10-27 NOTE — Progress Notes (Signed)
Location:  [facility] Room Number: 103-A Place of Service:  SNF (31) Provider:  Kenard Gower, DNP, FNP-BC  Patient Care Team: Burton Apley, MD as PCP - General (Internal Medicine)  Extended Emergency Contact Information Primary Emergency Contact: Maliki, Gignac Address: [address]          [place] [address] D[place] of [place] Home Phone: [phone] Mobile Phone: [phone] Relation: Spouse Secondary Emergency Contact: Kaeson, [place] Mobile Phone: [phone] Relation: Son  Code Status:  DNR  Goals of care: Advanced Directive information Advanced Directives 10/27/2020  Does Patient Have a Medical Advance Directive? Yes  Type of Advance Directive Out of facility DNR (pink MOST or yellow form)  Does patient want to make changes to medical advance directive? No - Patient declined  Copy of Healthcare Power of Attorney in Chart? Yes - validated most recent copy scanned in chart (See row information)  Pre-existing out of facility DNR order (yellow form or pink MOST form) Yellow form placed in chart (order not valid for inpatient use)     Chief Complaint  Patient presents with  . Medical Management of Chronic Issues    Routine [facility] visit    HPI:  Pt is an 84 y.o. male seen today for medical management of chronic diseases.  He is a long-term care resident of [facility].  He has a PMH of advanced dementia, history of COVID-19 infection and history of CVA and DVT.  He participates in restorative programs such as 1) splint/brace care to left knee, and 2) PROM of LLE to minimize further contractures on left knee. SBPs ranging from 121 to 134. He takes Losartan 25 mg 1 tab daily for hypertension.   Past Medical History:  Diagnosis Date  . Alzheimer disease (HCC) 09/25/2019   Past Surgical History:  Procedure Laterality Date  . APPENDECTOMY    . BACK SURGERY    . CHOLECYSTECTOMY    . HERNIA REPAIR       Allergies  Allergen Reactions  . Demerol [Meperidine] Nausea And Vomiting    Outpatient Encounter Medications as of 10/27/2020  Medication Sig  . acetaminophen (TYLENOL) 325 MG tablet Take 650 mg by mouth every 6 (six) hours as needed.  Marland Kitchen aspirin EC 81 MG EC tablet Take 1 tablet (81 mg total) by mouth daily.  Marland Kitchen atorvastatin (LIPITOR) 40 MG tablet Take 1 tablet (40 mg total) by mouth daily at 6 PM.  . losartan (COZAAR) 25 MG tablet Take 25 mg by mouth daily.  . magnesium hydroxide (MILK OF MAGNESIA) 400 MG/5ML suspension If no BM in 3 days, give 30 cc Milk of Magnesium p.o. x 1 dose in 24 hours as needed (Do not use standing constipation orders for residents with renal failure CFR less than 30. Contact MD for orders) (Physician Order)  . memantine (NAMENDA) 10 MG tablet Take 10 mg by mouth 2 (two) times daily.   . Menthol, Topical Analgesic, (BIOFREEZE) 4 % GEL Apply 1 application topically in the morning, at noon, and at bedtime.  . Nutritional Supplement LIQD Take 120 mLs by mouth in the morning, at noon, and at bedtime. MedPass  . Nutritional Supplements (NUTRITIONAL SUPPLEMENT PO) Take 1 each by mouth in the morning and at bedtime. Magic Cup  . rivastigmine (EXELON) 9.5 mg/24hr Place 9.5 mg onto the skin daily.  Marland Kitchen senna-docusate (SENOKOT-S) 8.6-50 MG tablet Take 1 tablet by mouth at bedtime as needed for mild constipation.   No facility-administered  encounter medications on file as of 10/27/2020.    Review of Systems  Unable to obtain due to dementia    Immunization History  Administered Date(s) Administered  . Influenza-Unspecified 09/27/2019, 10/16/2020  . Janssen (J&J) SARS-COV-2 Vaccination 05/05/2020  . PPD Test 01/09/2020   Pertinent  Health Maintenance Due  Topic Date Due  . PNA vac Low Risk Adult (1 of 2 - PCV13) 02/24/2021 (Originally 01/04/2003)  . INFLUENZA VACCINE  Completed    Vitals:   10/27/20 1153  BP: 121/71  Pulse: 73  Resp: 19  Temp: (!) 97.5 F  (36.4 C)  TempSrc: Oral  Weight: 151 lb 9.6 oz (68.8 kg)  Height: 6' (1.829 m)   Body mass index is 20.56 kg/m.  Physical Exam  GENERAL APPEARANCE: Well nourished. In no acute distress. Normal body habitus SKIN:  Skin is warm and dry.  MOUTH and THROAT: Lips are without lesions. Oral mucosa is moist and without lesions.  RESPIRATORY: Breathing is even & unlabored, BS CTAB CARDIAC: RRR, no murmur,no extra heart sounds GI: Abdomen soft, normal BS, no masses, no tenderness EXTREMITIES:  Left knee contracted NEUROLOGICAL: There is no tremor. Mostly nonverbal. Confused X 3 PSYCHIATRIC:  Affect and behavior are appropriate  Labs reviewed: Recent Labs    01/10/20 0337 01/10/20 0337 01/11/20 0256 01/11/20 0256 01/12/20 0308 05/05/20 0000 07/09/20 0000  NA 137   < > 137   < > 137 146 144  K 4.2   < > 4.0   < > 4.0 3.8 3.8  CL 104   < > 104   < > 105 109* 107  CO2 27   < > 23   < > 23 24* 27*  GLUCOSE 111*  --  118*  --  143*  --   --   BUN 23   < > 23   < > 25* 13 19  CREATININE 1.39*   < > 1.35*   < > 1.29* 0.9 0.9  CALCIUM 8.3*   < > 7.9*   < > 8.4* 9.0 8.9   < > = values in this interval not displayed.   Recent Labs    01/07/20 1814 01/08/20 0540 01/12/20 0308 05/05/20 0000  AST 20  --  24 29  ALT 24  --  21 20  ALKPHOS 74  --  76 256*  BILITOT 1.9* 1.8* 0.9  --   PROT 6.3*  --  6.0*  --   ALBUMIN 3.2*  --  2.6* 3.3*   Recent Labs    01/07/20 1814 01/07/20 1835 01/10/20 0337 05/05/20 0000 07/18/20 0000  WBC 10.4   < > 8.0 8.6 6.7  NEUTROABS 7.8*  --   --  5 4  HGB 14.8   < > 14.2 12.4* 11.9*  HCT 45.4   < > 41.9 38* 36*  MCV 93.0  --  90.3  --   --   PLT 151   < > 142* 166 167   < > = values in this interval not displayed.   Lab Results  Component Value Date   TSH 2.36 05/05/2020   Lab Results  Component Value Date   HGBA1C 5.5 01/09/2020   Lab Results  Component Value Date   CHOL 169 01/09/2020   HDL 34 (L) 01/09/2020   LDLCALC 119 (H)  01/09/2020   TRIG 80 01/09/2020   CHOLHDL 5.0 01/09/2020    Assessment/Plan  1. Contracture of left knee -  Continue restorative programs  such as 1) splint/brace care to left knee, and 2) PROM of LLE to prevent further contractures on left knee.  2. Late effects of cerebral ischemic stroke - stable, continue Atorvastatin and ASA  3. Essential hypertension -  Stable, continue Losartan -  Monitor BPs  4. Alzheimer disease (HCC) - continue Rivastigmine patch -  Continue supportive care and fall precautions    Family/ staff Communication: Discussed plan of care with charge nurse.  Labs/tests ordered: None  Goals of care:   Long-term care   Kenard Gower, DNP, MSN, FNP-BC [facility] [phone] (Monday-Friday 8:00 a.m. - 5:00 p.m.) [phone] (after hours)

## 2020-11-06 ENCOUNTER — Encounter: Payer: Self-pay | Admitting: Internal Medicine

## 2020-11-06 ENCOUNTER — Non-Acute Institutional Stay (SKILLED_NURSING_FACILITY): Payer: Medicare Other | Admitting: Internal Medicine

## 2020-11-06 DIAGNOSIS — H10502 Unspecified blepharoconjunctivitis, left eye: Secondary | ICD-10-CM | POA: Diagnosis not present

## 2020-11-06 NOTE — Progress Notes (Signed)
   NURSING HOME LOCATION:  [facility] ROOM NUMBER:  103A  CODE STATUS: DNR  PCP: Titus Dubin. Alwyn Ren, MD   This is a nursing facility follow up for specific acute issue of "red, puffy eye" at request of his wife.  Interim medical record and care since last [facility] visit was updated with review of diagnostic studies and change in clinical status since last visit were documented.  HPI: His wife stopped me in the hall and asked me to check her husband's eye which she said had been "red and puffy" since 11/10.  She was unaware of any trauma or injury to the eye and the patient can provide no history due to his advanced dementia.  Review of systems: She had noted some liquid discharge and redness of the lower lid.  Staff has not reported any fever or purulence.  Physical exam:  Pertinent or positive findings: The patient has profound dementia and can provide no history and cannot follow commands.  Arcus senilis is present bilaterally.  There is very mild erythema of the left lower lid with some slight orangish discharge medially along the lower lid.  There is no significant scleritis or conjunctivitis.  Extraocular motion appears intact.  He could not count fingers.  He has no lymphadenopathy about the head or neck.  General appearance: Adequately nourished; no acute distress, increased work of breathing is present.   Nose:  External nasal examination shows no deformity or inflammation. Nasal mucosa are pink and moist without lesions, exudates Neck:  No thyromegaly, masses, tenderness noted.    Skin: Warm & dry w/o tenting. No significant lesions or rash.  See summary under each active problem in the Problem List with associated updated therapeutic plan

## 2020-11-06 NOTE — Patient Instructions (Signed)
I have verified with his wife that he has no antibiotic allergies.  I told her that we would be applying an antibiotic ointment into the left lower conjunctival sac twice daily for 7 days.

## 2020-12-05 ENCOUNTER — Non-Acute Institutional Stay (SKILLED_NURSING_FACILITY): Payer: Medicare Other | Admitting: Adult Health

## 2020-12-05 ENCOUNTER — Encounter: Payer: Self-pay | Admitting: Adult Health

## 2020-12-05 DIAGNOSIS — I6930 Unspecified sequelae of cerebral infarction: Secondary | ICD-10-CM

## 2020-12-05 DIAGNOSIS — M24562 Contracture, left knee: Secondary | ICD-10-CM

## 2020-12-05 DIAGNOSIS — I10 Essential (primary) hypertension: Secondary | ICD-10-CM

## 2020-12-05 DIAGNOSIS — F0280 Dementia in other diseases classified elsewhere without behavioral disturbance: Secondary | ICD-10-CM

## 2020-12-05 DIAGNOSIS — G309 Alzheimer's disease, unspecified: Secondary | ICD-10-CM | POA: Diagnosis not present

## 2020-12-05 NOTE — Progress Notes (Signed)
Location:  [facility] Room Number: 103-A Place of Service:  SNF (31) Provider:  Kenard Gower, DNP, FNP-BC  Patient Care Team: Pecola Lawless, MD as PCP - General (Internal Medicine) [name], [name], NP as Nurse Practitioner (Internal Medicine) Rehab, [facility]Sk[facility])  Extended Emergency Contact Information Primary Emergency Contact: Morgen, Ritacco Address: [address]          [place] [address] [place] of [place] Home Phone: [phone] Mobile Phone: [phone] Relation: Spouse Secondary Emergency Contact: Uzziel, Rus[place] Mobile Phone: [phone] Relation: Son  Code Status:  DNR  Goals of care: Advanced Directive information Advanced Directives Kyle/12/2019  Does Patient Have a Medical Advance Directive? Yes  Type of Advance Directive Out of facility DNR (pink MOST or yellow form)  Does patient want to make changes to medical advance directive? No - Patient declined  Copy of Healthcare Power of Attorney in Chart? Yes - validated most recent copy scanned in chart (See row information)  Pre-existing out of facility DNR order (yellow form or pink MOST form) Yellow form placed in chart (order not valid for inpatient use)     Chief Complaint  Patient presents with   Medical Management of Chronic Issues    Routine [facility] visit    HPI:  Pt is an 84 y.o. [name] seen today for medical management of chronic diseases.  He is a long-term care resident of [facility].  He has a PMH of advanced dementia, CVA, DVT and COVID-19 infection.He participates in restorative program such as 1) donning and doffing splint/brace to left knee after 2) PROM to LLE.  He had his COVID-19 booster on 12/02/20. No side effects was reported.  SBPs ranging from 108 to 143.  He is currently taking losartan 25 mg 1 tab daily for hypertension.   Past Medical History:  Diagnosis Date    Alzheimer disease (HCC) 09/25/2019   Past Surgical History:  Procedure Laterality Date   APPENDECTOMY     BACK SURGERY     CHOLECYSTECTOMY     HERNIA REPAIR      Allergies  Allergen Reactions   Demerol [Meperidine] Nausea And Vomiting    Outpatient Encounter Medications as of 12/05/2020  Medication Sig   acetaminophen (TYLENOL) 325 MG tablet Take 650 mg by mouth every 6 (six) hours as needed.   aspirin EC 81 MG EC tablet Take 1 tablet (81 mg total) by mouth daily.   atorvastatin (LIPITOR) 40 MG tablet Take 1 tablet (40 mg total) by mouth daily at 6 PM.   losartan (COZAAR) 25 MG tablet Take 25 mg by mouth daily.   magnesium hydroxide (MILK OF MAGNESIA) 400 MG/5ML suspension If no BM in 3 days, give 30 cc Milk of Magnesium p.o. x 1 dose in 24 hours as needed (Do not use standing constipation orders for residents with renal failure CFR less than 30. Contact MD for orders) (Physician Order)   memantine (NAMENDA) 10 MG tablet Take 10 mg by mouth 2 (two) times daily.    Menthol, Topical Analgesic, (BIOFREEZE) 4 % GEL Apply 1 application topically in the morning, at noon, and at bedtime.   Nutritional Supplement LIQD Take 120 mLs by mouth in the morning, at noon, and at bedtime. MedPass   Nutritional Supplements (NUTRITIONAL SUPPLEMENT PO) Take 1 each by mouth in the morning and at bedtime. Magic Cup   rivastigmine (EXELON) 9.5 mg/24hr Place 9.5 mg onto the skin daily.  senna-docusate (SENOKOT-S) 8.6-50 MG tablet Take 1 tablet by mouth at bedtime as needed for mild constipation.   No facility-administered encounter medications on file as of 12/05/2020.    Review of Systems unable to obtain due to dementia    Immunization History  Administered Date(s) Administered   Influenza-Unspecified 09/27/2019, 10/16/2020   Janssen (J&J) SARS-COV-2 Vaccination 05/05/2020   PPD Test 01/09/2020   Pertinent  Health Maintenance Due  Topic Date Due   PNA vac Low Risk Adult (1  of 2 - PCV13) 02/24/2021 (Originally 01/04/2003)   INFLUENZA VACCINE  Completed    Vitals:   12/05/20 0934  BP: 123/62  Pulse: (!) 58  Resp: 17  Temp: 97.7 F (36.5 C)  TempSrc: Oral  Weight: 149 lb 6.4 oz (67.8 kg)  Height: 6' (1.829 m)   Body mass index is 20.26 kg/m.  Physical Exam  GENERAL APPEARANCE:  In no acute distress. Normal body habitus SKIN:  Skin is warm and dry.  MOUTH and THROAT: Lips are without lesions. Oral mucosa is moist and without lesions.  RESPIRATORY: Breathing is even & unlabored, BS CTAB CARDIAC: RRR, no murmur,no extra heart sounds, no edema GI: Abdomen soft, normal BS, no masses, no tenderness EXTREMITIES:  Left knee contracted NEUROLOGICAL: There is no tremor. Talks nonsensical words. PSYCHIATRIC:  Affect and behavior are appropriate  Labs reviewed: Recent Labs    01/10/20 0337 01/11/20 0256 01/12/20 0308 05/05/20 0000 07/09/20 0000  NA 137 137 137 146 144  K 4.2 4.0 4.0 3.8 3.8  CL 104 104 105 109* 107  CO2 27 23 23  24* 27*  GLUCOSE 111* 118* 143*  --   --   BUN 23 23 25* 13 19  CREATININE 1.39* 1.35* 1.29* 0.9 0.9  CALCIUM 8.3* 7.9* 8.4* 9.0 8.9   Recent Labs    01/Kyle/21 1814 01/08/20 0540 01/12/20 0308 05/05/20 0000  AST 20  --  24 29  ALT 24  --  21 20  ALKPHOS 74  --  76 256*  BILITOT 1.9* 1.8* 0.9  --   PROT 6.3*  --  6.0*  --   ALBUMIN 3.2*  --  2.6* 3.3*   Recent Labs    01/Kyle/21 1814 01/Kyle/21 1835 01/10/20 0337 05/05/20 0000 07/18/20 0000  WBC 10.4  --  8.0 8.6 6.7  NEUTROABS 7.8*  --   --  5 4  HGB 14.8   < > 14.2 12.4* Kyle.9*  HCT 45.4   < > 41.9 38* 36*  MCV 93.0  --  90.3  --   --   PLT 151  --  142* 166 167   < > = values in this interval not displayed.   Lab Results  Component Value Date   TSH 2.36 05/05/2020   Lab Results  Component Value Date   HGBA1C 5.5 01/09/2020   Lab Results  Component Value Date   CHOL 169 01/09/2020   HDL 34 (L) 01/09/2020   LDLCALC 119 (H) 01/09/2020   TRIG 80  01/09/2020   CHOLHDL 5.0 01/09/2020    Assessment/Plan  1. Essential hypertension -Stable, continue losartan  2. Late effects of cerebral ischemic stroke -  Has left knee contracted, continue restorative program -Continue aspirin and atorvastatin  3. Contracture of left knee -  continue restorative program  4. Alzheimer disease (HCC) -Continue supportive care and fall precautions     Family/ staff Communication: Discussed plan of care with charge nurse.  Labs/tests ordered: None  Goals of  care:   Long-term care   Kenard Gower, DNP, MSN, FNP-BC [facility] [phone] (Monday-Friday 8:00 a.m. - 5:00 p.m.) [phone] (after hours)

## 2020-12-10 ENCOUNTER — Encounter: Payer: Self-pay | Admitting: Adult Health

## 2020-12-10 ENCOUNTER — Non-Acute Institutional Stay (SKILLED_NURSING_FACILITY): Payer: Medicare Other | Admitting: Adult Health

## 2020-12-10 DIAGNOSIS — Z7189 Other specified counseling: Secondary | ICD-10-CM | POA: Diagnosis not present

## 2020-12-10 DIAGNOSIS — I10 Essential (primary) hypertension: Secondary | ICD-10-CM

## 2020-12-10 DIAGNOSIS — M24562 Contracture, left knee: Secondary | ICD-10-CM

## 2020-12-10 DIAGNOSIS — Z8673 Personal history of transient ischemic attack (TIA), and cerebral infarction without residual deficits: Secondary | ICD-10-CM

## 2020-12-10 DIAGNOSIS — G309 Alzheimer's disease, unspecified: Secondary | ICD-10-CM

## 2020-12-10 DIAGNOSIS — F0280 Dementia in other diseases classified elsewhere without behavioral disturbance: Secondary | ICD-10-CM

## 2020-12-10 NOTE — Progress Notes (Signed)
Location:  [facility] Room Number: 103-A Place of Service:  SNF (31) Provider:  Kenard Gower, DNP, FNP-BC  Patient Care Team: Pecola Lawless, MD as PCP - General (Internal Medicine) [name], [name], NP as Nurse Practitioner (Internal Medicine) Rehab, [facility] (Skilled Nursing Facility)  Extended Emergency Contact Information Primary Emergency Contact: Kyle, [name] Address: [address]          [place] [address] [place] Home Phone: [phone] Mobile Phone: [phone] Relation: Spouse Secondary Emergency Contact: [name], [name] Mobile Phone: [phone] Relation: Son  Code Status:   DNR  Goals of care: Advanced Directive information Advanced Directives 12/10/2020  Does Patient Have a Medical Advance Directive? Yes  Type of Estate agent of [place];Out of facility DNR (pink MOST or yellow form)  Does patient want to make changes to medical advance directive? No - Patient declined  Copy of Healthcare Power of Attorney in Chart? Yes - validated most recent copy scanned in chart (See row information)  Pre-existing out of facility DNR order (yellow form or pink MOST form) Yellow form placed in chart (order not valid for inpatient use)     Chief Complaint  Patient presents with  . Advanced Directive    Patient is seen for a Care Plan Meeting    HPI:  Pt is an 84 y.o. male seen today for a care plan meeting.  He is a long-term care resident of [facility].  He has a PMH of advanced dementia, CVA, DVT and COVID-19 infection. The meeting was attended by son, wife, MDS coordinator, NP, social worker, life enrichment  and dietitian.  He remains to be DNR. Discussed medications, vital signs and weights. Answered medical questions by son and wife. Latest weight is 149.4 lbs. Dietitian stated that his weight fluctuates but stable. He had Moderna COVID-19 vaccine  booster on 12/02/20. The meeting lasted for 30 minutes.   Past Medical History:  Diagnosis Date  . Alzheimer disease (HCC) 09/25/2019   Past Surgical History:  Procedure Laterality Date  . APPENDECTOMY    . BACK SURGERY    . CHOLECYSTECTOMY    . HERNIA REPAIR      Allergies  Allergen Reactions  . Demerol [Meperidine] Nausea And Vomiting    Outpatient Encounter Medications as of 12/10/2020  Medication Sig  . acetaminophen (TYLENOL) 325 MG tablet Take 650 mg by mouth every 6 (six) hours as needed.  Marland Kitchen aspirin EC 81 MG EC tablet Take 1 tablet (81 mg total) by mouth daily.  Marland Kitchen atorvastatin (LIPITOR) 40 MG tablet Take 1 tablet (40 mg total) by mouth daily at 6 PM.  . losartan (COZAAR) 25 MG tablet Take 25 mg by mouth daily.  . magnesium hydroxide (MILK OF MAGNESIA) 400 MG/5ML suspension If no BM in 3 days, give 30 cc Milk of Magnesium p.o. x 1 dose in 24 hours as needed (Do not use standing constipation orders for residents with renal failure CFR less than 30. Contact MD for orders) (Physician Order)  . memantine (NAMENDA) 10 MG tablet Take 10 mg by mouth 2 (two) times daily.   . Menthol, Topical Analgesic, (BIOFREEZE) 4 % GEL Apply 1 application topically in the morning, at noon, and at bedtime.  . Nutritional Supplement LIQD Take 120 mLs by mouth in the morning, at noon, and at bedtime. MedPass  . Nutritional Supplements (NUTRITIONAL SUPPLEMENT PO) Take 1 each by mouth in the morning and at bedtime. Magic  Cup  . rivastigmine (EXELON) 9.5 mg/24hr Place 9.5 mg onto the skin daily.  Marland Kitchen senna-docusate (SENOKOT-S) 8.6-50 MG tablet Take 1 tablet by mouth at bedtime as needed for mild constipation.   No facility-administered encounter medications on file as of 12/10/2020.    Review of Systems  Unable to obtain due to dementia   Immunization History  Administered Date(s) Administered  . Influenza-Unspecified 09/27/2019, 10/16/2020  . Moderna Sars-Covid-2 Vaccination 05/05/2020,  05/30/2020, 12/02/2020  . PPD Test 01/09/2020   Pertinent  Health Maintenance Due  Topic Date Due  . PNA vac Low Risk Adult (1 of 2 - PCV13) 02/24/2021 (Originally 01/04/2003)  . INFLUENZA VACCINE  Completed    Vitals:   12/10/20 1047  BP: 138/70  Pulse: (!) 55  Resp: 17  Temp: (!) 97.2 F (36.2 C)  TempSrc: Oral  Weight: 149 lb 6.4 oz (67.8 kg)  Height: 6' (1.829 m)   Body mass index is 20.26 kg/m.  Physical Exam  GENERAL APPEARANCE: Well nourished. In no acute distress. Normal body habitus SKIN:  Skin is warm and dry.  MOUTH and THROAT: Lips are without lesions. Oral mucosa is moist and without lesions.  RESPIRATORY: Breathing is even & unlabored, BS CTAB CARDIAC: RRR, no murmur,no extra heart sounds, no edema GI: Abdomen soft, normal BS, no masses, no tenderness EXTREMITIES: Left knee is contracted. LLE weakness. NEUROLOGICAL: There is no tremor. Nonverbal. PSYCHIATRIC: Affect and behavior are appropriate  Labs reviewed: Recent Labs    01/10/20 0337 01/11/20 0256 01/12/20 0308 05/05/20 0000 07/09/20 0000  NA 137 137 137 146 144  K 4.2 4.0 4.0 3.8 3.8  CL 104 104 105 109* 107  CO2 27 23 23  24* 27*  GLUCOSE 111* 118* 143*  --   --   BUN 23 23 25* 13 19  CREATININE 1.39* 1.35* 1.29* 0.9 0.9  CALCIUM 8.3* 7.9* 8.4* 9.0 8.9   Recent Labs    01/07/20 1814 01/08/20 0540 01/12/20 0308 05/05/20 0000  AST 20  --  24 29  ALT 24  --  21 20  ALKPHOS 74  --  76 256*  BILITOT 1.9* 1.8* 0.9  --   PROT 6.3*  --  6.0*  --   ALBUMIN 3.2*  --  2.6* 3.3*   Recent Labs    01/07/20 1814 01/07/20 1835 01/10/20 0337 05/05/20 0000 07/18/20 0000  WBC 10.4  --  8.0 8.6 6.7  NEUTROABS 7.8*  --   --  5 4  HGB 14.8   < > 14.2 12.4* 11.9*  HCT 45.4   < > 41.9 38* 36*  MCV 93.0  --  90.3  --   --   PLT 151  --  142* 166 167   < > = values in this interval not displayed.   Lab Results  Component Value Date   TSH 2.36 05/05/2020   Lab Results  Component Value Date    HGBA1C 5.5 01/09/2020   Lab Results  Component Value Date   CHOL 169 01/09/2020   HDL 34 (L) 01/09/2020   LDLCALC 119 (H) 01/09/2020   TRIG 80 01/09/2020   CHOLHDL 5.0 01/09/2020    Assessment/Plan  1. Advance care planning -    Remains to be DNR -    Discussed medications, vital signs and weights  2. History of CVA (cerebrovascular accident) -    Stable, contin aspirin and atorvastatin  3. Essential hypertension -    BP stable, continue losartan  4.  Contracture of left knee -   Currently on restorative programs such as splint/brace care to left knee and PROM of LLE  5.  Alzheimer's disease -    Continue memantine and Exelon patch -     Continue supportive care    Family/ staff Communication: Discussed plan of care with son, wife and IDT.  Labs/tests ordered: None  Goals of care:   Long-term care   Kenard Gower, DNP, MSN, FNP-BC [facility] [phone] (Monday-Friday 8:00 a.m. - 5:00 p.m.) [phone] (after hours)

## 2020-12-26 ENCOUNTER — Encounter: Payer: Self-pay | Admitting: Internal Medicine

## 2020-12-26 ENCOUNTER — Non-Acute Institutional Stay (SKILLED_NURSING_FACILITY): Payer: Medicare Other | Admitting: Internal Medicine

## 2020-12-26 DIAGNOSIS — R296 Repeated falls: Secondary | ICD-10-CM | POA: Diagnosis not present

## 2020-12-26 DIAGNOSIS — F0280 Dementia in other diseases classified elsewhere without behavioral disturbance: Secondary | ICD-10-CM | POA: Diagnosis not present

## 2020-12-26 DIAGNOSIS — G309 Alzheimer's disease, unspecified: Secondary | ICD-10-CM

## 2020-12-26 NOTE — Assessment & Plan Note (Addendum)
Staff describes him as falling and also being "fidgety" Also he is reported as attempting to get out of bed or chair unassisted. Up to Date reports tremor with Exelon; 7% with the transdermal form and 4-23% with the oral. Namenda has been associated with dizziness and confusion in roughly 6-7% of cases.  Exelon will be held and patient monitored.

## 2020-12-26 NOTE — Progress Notes (Signed)
   NURSING HOME LOCATION:  [facility] ROOM NUMBER:  103 A  CODE STATUS: DNR  PCP: Douglass Rivers, MD  This is a nursing facility follow up for specific acute issue of fall on 12/25/2020.  Staff has requested medication review.  Interim medical record and care since last [facility] visit was updated with review of diagnostic studies and change in clinical status since last visit were documented.  HPI: He is a permanent resident of facility with medical diagnoses of history of stroke complicated by dysphagia, Alzheimer's dementia, history of DVT, essential hypertension, dyslipidemia, and adult failure to thrive. Staff is concerned in that in addition to the apparent fall 2012/30 when he was found sitting on the floor; he repeatedly attempts to get out of bed or his chair without assistance.  They also describe him as displaying "very fidgety behavior".  Review of systems: Dementia prevented completion of review of systems.    Physical exam:  Pertinent or positive findings:He was essentially nonverbal and any verbalization was unintelligible and unfocused.  He was unable to follow commands.  Pattern alopecia is present.  He was in the portable lounger eating a sandwich ravenously.  He would look around but not focus on the examiner.  He exhibited intermittent myoclonic jerking.  Ptosis is present bilaterally slightly greater on the left.  The left nasolabial fold is slightly decreased.  Heart rhythm and rate is slow and regular.  Breath sounds are decreased.  Posterior tibial pulses are stronger than the dorsalis pedis pulses.  Legs were flexed and he would not extend the legs or oppose my hands to test strength.  General appearance: Adequately nourished; no acute distress, increased work of breathing is present.   Lymphatic: No lymphadenopathy about the head, neck, axilla. Eyes: No conjunctival inflammation or lid edema is present. There is no scleral icterus. Ears:  External ear  exam shows no significant lesions or deformities.   Nose:  External nasal examination shows no deformity or inflammation. Nasal mucosa are pink and moist without lesions, exudates Neck:  No thyromegaly, masses, tenderness noted.    Heart:  No gallop, murmur, click, rub .  Lungs:  without wheezes, rhonchi, rales, rubs. Abdomen: Bowel sounds are normal. Abdomen is soft and nontender with no organomegaly, hernias, masses. GU: Deferred  Extremities:  No cyanosis, clubbing, edema  Neurologic exam :Balance, Rhomberg, finger to nose testing could not be completed due to clinical state Skin: Warm & dry w/o tenting. No significant lesions or rash.  See summary under each active problem in the Problem List with associated updated therapeutic plan

## 2020-12-26 NOTE — Assessment & Plan Note (Signed)
Discontinue transdermal Exelon and monitor behavior. Update labs if signs and symptoms persist.

## 2020-12-26 NOTE — Patient Instructions (Signed)
See assessment and plan under each diagnosis in the problem list and acutely for this visit 

## 2020-12-29 ENCOUNTER — Non-Acute Institutional Stay (SKILLED_NURSING_FACILITY): Payer: Medicare Other | Admitting: Adult Health

## 2020-12-29 ENCOUNTER — Encounter: Payer: Self-pay | Admitting: Adult Health

## 2020-12-29 DIAGNOSIS — G309 Alzheimer's disease, unspecified: Secondary | ICD-10-CM

## 2020-12-29 DIAGNOSIS — Z8673 Personal history of transient ischemic attack (TIA), and cerebral infarction without residual deficits: Secondary | ICD-10-CM | POA: Diagnosis not present

## 2020-12-29 DIAGNOSIS — M24562 Contracture, left knee: Secondary | ICD-10-CM

## 2020-12-29 DIAGNOSIS — I10 Essential (primary) hypertension: Secondary | ICD-10-CM

## 2020-12-29 DIAGNOSIS — F0280 Dementia in other diseases classified elsewhere without behavioral disturbance: Secondary | ICD-10-CM

## 2020-12-29 NOTE — Progress Notes (Signed)
Location:  [facility] Room Number: 103 A Place of Service:  SNF (31) Provider:  Kenard Gower, DNP, FNP-BC  Patient Care Team: Pecola Lawless, MD as PCP - General (Internal Medicine) [name], [name], NP as Nurse Practitioner (Internal Medicine) Rehab, [facility] (Skilled Nursing Facility)  Extended Emergency Contact Information Primary Emergency Contact: Rahmel, Nedved Address: [address]          [place] [address] [place] Home Phone: [phone] Mobile Phone: [phone] Relation: Spouse Secondary Emergency Contact: Sparsh, Callens Mobile Phone: [phone] Relation: Son  Code Status:  DNR  Goals of care: Advanced Directive information Advanced Directives 12/10/2020  Does Patient Have a Medical Advance Directive? Yes  Type of Estate agent of [place];Out of facility DNR (pink MOST or yellow form)  Does patient want to make changes to medical advance directive? No - Patient declined  Copy of Healthcare Power of Attorney in Chart? Yes - validated most recent copy scanned in chart (See row information)  Pre-existing out of facility DNR order (yellow form or pink MOST form) Yellow form placed in chart (order not valid for inpatient use)     Chief Complaint  Patient presents with  . Medical Management of Chronic Issues    HPI:  Pt is a 85 y.o. male seen today for medical management of chronic diseases.  He is a long-term care resident of [facility].  He has a PMH of advanced dementia, CVA, DVT and COVID-19 infection. Exelon patch was recently discontinued due to being fidgety and tries to get out of bed. He was seen in his room today. No fidgeting noted.  SBPs ranging from 110-128.  He takes losartan 25 mg daily for hypertension.   Past Medical History:  Diagnosis Date  . Alzheimer disease (HCC) 09/25/2019   Past Surgical History:  Procedure Laterality  Date  . APPENDECTOMY    . BACK SURGERY    . CHOLECYSTECTOMY    . HERNIA REPAIR      Allergies  Allergen Reactions  . Demerol [Meperidine] Nausea And Vomiting    Outpatient Encounter Medications as of 12/29/2020  Medication Sig  . acetaminophen (TYLENOL) 325 MG tablet Take 650 mg by mouth every 6 (six) hours as needed.  Marland Kitchen aspirin EC 81 MG EC tablet Take 1 tablet (81 mg total) by mouth daily.  Marland Kitchen atorvastatin (LIPITOR) 40 MG tablet Take 1 tablet (40 mg total) by mouth daily at 6 PM.  . losartan (COZAAR) 25 MG tablet Take 25 mg by mouth daily.  . magnesium hydroxide (MILK OF MAGNESIA) 400 MG/5ML suspension If no BM in 3 days, give 30 cc Milk of Magnesium p.o. x 1 dose in 24 hours as needed (Do not use standing constipation orders for residents with renal failure CFR less than 30. Contact MD for orders) (Physician Order)  . memantine (NAMENDA) 10 MG tablet Take 10 mg by mouth 2 (two) times daily.   . Menthol, Topical Analgesic, (BIOFREEZE) 4 % GEL Apply 1 application topically in the morning, at noon, and at bedtime.  . Nutritional Supplement LIQD Take 120 mLs by mouth in the morning, at noon, and at bedtime. MedPass  . Nutritional Supplements (NUTRITIONAL SUPPLEMENT PO) Take 1 each by mouth in the morning and at bedtime. Magic Cup  . senna-docusate (SENOKOT-S) 8.6-50 MG tablet Take 1 tablet by mouth at bedtime as needed for mild constipation.  . [DISCONTINUED] rivastigmine (EXELON) 9.5 mg/24hr Place 9.5 mg  onto the skin daily.   No facility-administered encounter medications on file as of 12/29/2020.    Review of Systems unable to obtain due to dementia   Immunization History  Administered Date(s) Administered  . Influenza-Unspecified 09/27/2019, 10/16/2020  . Moderna Sars-Covid-2 Vaccination 05/05/2020, 05/30/2020, 12/02/2020  . PPD Test 01/09/2020   Pertinent  Health Maintenance Due  Topic Date Due  . PNA vac Low Risk Adult (1 of 2 - PCV13) 02/24/2021 (Originally 01/04/2003)  .  INFLUENZA VACCINE  Completed   No flowsheet data found.   Vitals:   12/29/20 1537  BP: 114/65  Pulse: 70  Resp: 20  Temp: (!) 97.3 F (36.3 C)  Weight: 149 lb 6.4 oz (67.8 kg)  Height: 6' (1.829 m)   Body mass index is 20.26 kg/m.  Physical Exam  GENERAL APPEARANCE: Well nourished. In no acute distress. Normal body habitus SKIN:  Skin is warm and dry.  MOUTH and THROAT: Lips are without lesions. Oral mucosa is moist and without lesions.  RESPIRATORY: Breathing is even & unlabored, BS CTAB CARDIAC: RRR, no murmur,no extra heart sounds, no edema GI: Abdomen soft, normal BS, no masses, no tenderness EXTREMITIES:  Left knee contracted NEUROLOGICAL: There is no tremor. Nonverbal PSYCHIATRIC:  Affect and behavior are appropriate  Labs reviewed: Recent Labs    01/10/20 0337 01/11/20 0256 01/12/20 0308 05/05/20 0000 07/09/20 0000  NA 137 137 137 146 144  K 4.2 4.0 4.0 3.8 3.8  CL 104 104 105 109* 107  CO2 27 23 23  24* 27*  GLUCOSE 111* 118* 143*  --   --   BUN 23 23 25* 13 19  CREATININE 1.39* 1.35* 1.29* 0.9 0.9  CALCIUM 8.3* 7.9* 8.4* 9.0 8.9   Recent Labs    01/07/20 1814 01/08/20 0540 01/12/20 0308 05/05/20 0000  AST 20  --  24 29  ALT 24  --  21 20  ALKPHOS 74  --  76 256*  BILITOT 1.9* 1.8* 0.9  --   PROT 6.3*  --  6.0*  --   ALBUMIN 3.2*  --  2.6* 3.3*   Recent Labs    01/07/20 1814 01/07/20 1835 01/10/20 0337 05/05/20 0000 07/18/20 0000  WBC 10.4  --  8.0 8.6 6.7  NEUTROABS 7.8*  --   --  5 4  HGB 14.8   < > 14.2 12.4* 11.9*  HCT 45.4   < > 41.9 38* 36*  MCV 93.0  --  90.3  --   --   PLT 151  --  142* 166 167   < > = values in this interval not displayed.   Lab Results  Component Value Date   TSH 2.36 05/05/2020   Lab Results  Component Value Date   HGBA1C 5.5 01/09/2020   Lab Results  Component Value Date   CHOL 169 01/09/2020   HDL 34 (L) 01/09/2020   LDLCALC 119 (H) 01/09/2020   TRIG 80 01/09/2020   CHOLHDL 5.0 01/09/2020      Assessment/Plan  1. History of CVA (cerebrovascular accident) -Stable, continue atorvastatin and aspirin  2. Essential hypertension -BPs stable, continue losartan  3. Contracture of left knee -  Continue restorative splinting/brace care on left knee and PROM LLE -  Continue Biofreeze gel topically to left knee  4. Alzheimer disease (HCC) -  Exelon patch was recently discontinued due to fidgeting behavior -  Continue Memantine     Family/ staff Communication: Discussed plan of care with charge nurse.  Labs/tests ordered: None  Goals of care:   Long-term care   Durenda Age, DNP, MSN, FNP-BC [facility] [phone] (Monday-Friday 8:00 a.m. - 5:00 p.m.) [phone] (after hours)

## 2021-02-16 ENCOUNTER — Non-Acute Institutional Stay (SKILLED_NURSING_FACILITY): Payer: Medicare Other | Admitting: Adult Health

## 2021-02-16 ENCOUNTER — Encounter: Payer: Self-pay | Admitting: Adult Health

## 2021-02-16 DIAGNOSIS — G301 Alzheimer's disease with late onset: Secondary | ICD-10-CM

## 2021-02-16 DIAGNOSIS — I6930 Unspecified sequelae of cerebral infarction: Secondary | ICD-10-CM

## 2021-02-16 DIAGNOSIS — M24562 Contracture, left knee: Secondary | ICD-10-CM | POA: Diagnosis not present

## 2021-02-16 DIAGNOSIS — F0281 Dementia in other diseases classified elsewhere with behavioral disturbance: Secondary | ICD-10-CM

## 2021-02-16 DIAGNOSIS — F02818 Dementia in other diseases classified elsewhere, unspecified severity, with other behavioral disturbance: Secondary | ICD-10-CM

## 2021-02-16 DIAGNOSIS — I10 Essential (primary) hypertension: Secondary | ICD-10-CM

## 2021-02-16 NOTE — Progress Notes (Signed)
Location:  [facility] Room Number: 315 A Place of Service:  SNF (31) Provider:  Kenard Gower, DNP, FNP-BC  Patient Care Team: Pecola Lawless, MD as PCP - General (Internal Medicine) [name], [name], NP as Nurse Practitioner (Internal Medicine) Rehab, [facility] (Skilled Nursing Facility)  Extended Emergency Contact Information Primary Emergency Contact: Trenton, Passow Address: [address]          [place] [phone] [place] Home Phone: [phone] Mobile Phone: [phone] Relation: Spouse Secondary Emergency Contact: Fabiano, Ginley Mobile Phone: [phone] Relation: Son  Code Status:  DNR  Goals of care: Advanced Directive information Advanced Directives 12/10/2020  Does Patient Have a Medical Advance Directive? Yes  Type of Estate agent of [place];Out of facility DNR (pink MOST or yellow form)  Does patient want to make changes to medical advance directive? No - Patient declined  Copy of Healthcare Power of Attorney in Chart? Yes - validated most recent copy scanned in chart (See row information)  Pre-existing out of facility DNR order (yellow form or pink MOST form) Yellow form placed in chart (order not valid for inpatient use)     Chief Complaint  Patient presents with  . Medical Management of Chronic Issues    HPI:  Pt is a 85 y.o. male seen today for medical management of chronic diseases. He has a PMH of advanced dementia, CVA, DVT and COVID-19 infection. He sits on his broda by the nurses' station. Wife comes daily. SBPs ranging from 108-143. He takes losartan 25 mg daily for hypertension. He has left-sided weakness due to history of CVA. He takes Atorvastatin 40 mg daily and ASA EC 81 mg daily. He is needing 1-person assist with ADLs.   Past Medical History:  Diagnosis Date  . Alzheimer disease (HCC) 09/25/2019   Past Surgical History:  Procedure Laterality  Date  . APPENDECTOMY    . BACK SURGERY    . CHOLECYSTECTOMY    . HERNIA REPAIR      Allergies  Allergen Reactions  . Demerol [Meperidine] Nausea And Vomiting    Outpatient Encounter Medications as of 02/16/2021  Medication Sig  . acetaminophen (TYLENOL) 325 MG tablet Take 650 mg by mouth every 6 (six) hours as needed.  Marland Kitchen aspirin EC 81 MG EC tablet Take 1 tablet (81 mg total) by mouth daily.  Marland Kitchen atorvastatin (LIPITOR) 40 MG tablet Take 1 tablet (40 mg total) by mouth daily at 6 PM.  . bisacodyl (DULCOLAX) 10 MG suppository Place 10 mg rectally as needed for moderate constipation.  Marland Kitchen losartan (COZAAR) 25 MG tablet Take 25 mg by mouth daily.  . magnesium hydroxide (MILK OF MAGNESIA) 400 MG/5ML suspension If no BM in 3 days, give 30 cc Milk of Magnesium p.o. x 1 dose in 24 hours as needed (Do not use standing constipation orders for residents with renal failure CFR less than 30. Contact MD for orders) (Physician Order)  . memantine (NAMENDA) 10 MG tablet Take 10 mg by mouth 2 (two) times daily.   . Menthol, Topical Analgesic, (BIOFREEZE) 4 % GEL Apply 1 application topically in the morning, at noon, and at bedtime.  . Nutritional Supplement LIQD Take 120 mLs by mouth in the morning, at noon, and at bedtime. MedPass  . Nutritional Supplements (NUTRITIONAL SUPPLEMENT PO) Take 1 each by mouth in the morning and at bedtime. Magic Cup  . senna-docusate (SENOKOT-S) 8.6-50 MG tablet Take 1 tablet by mouth at bedtime  as needed for mild constipation.   No facility-administered encounter medications on file as of 02/16/2021.    Review of Systems  Unable to obtain due to dementia.   Immunization History  Administered Date(s) Administered  . Influenza-Unspecified 09/27/2019, 10/16/2020  . Moderna Sars-Covid-2 Vaccination 05/05/2020, 05/30/2020, 12/02/2020  . PPD Test 01/09/2020   Pertinent  Health Maintenance Due  Topic Date Due  . PNA vac Low Risk Adult (1 of 2 - PCV13) 02/24/2021 (Originally  01/04/2003)  . INFLUENZA VACCINE  Completed   No flowsheet data found.   Vitals:   02/16/21 1052  BP: (!) 115/55  Pulse: (!) 55  Resp: 18  Temp: 97.7 F (36.5 C)  Weight: 161 lb 3.2 oz (73.1 kg)  Height: 6' (1.829 m)   Body mass index is 21.86 kg/m.  Physical Exam  GENERAL APPEARANCE: Well nourished. In no acute distress. Normal body habitus SKIN:  Skin is warm and dry.  MOUTH and THROAT: Lips are without lesions. Oral mucosa is moist and without lesions.  RESPIRATORY: Breathing is even & unlabored, BS CTAB CARDIAC: RRR, no murmur,no extra heart sounds, no edema GI: Abdomen soft, normal BS, no masses, no tenderness EXTREMITIES:  Left knee contracted NEUROLOGICAL: There is no tremor. Nonverbal. Left-sided weakness. PSYCHIATRIC:  Affect and behavior are appropriate  Labs reviewed: Recent Labs    05/05/20 0000 07/09/20 0000  NA 146 144  K 3.8 3.8  CL 109* 107  CO2 24* 27*  BUN 13 19  CREATININE 0.9 0.9  CALCIUM 9.0 8.9   Recent Labs    05/05/20 0000  AST 29  ALT 20  ALKPHOS 256*  ALBUMIN 3.3*   Recent Labs    05/05/20 0000 07/18/20 0000  WBC 8.6 6.7  NEUTROABS 5 4  HGB 12.4* 11.9*  HCT 38* 36*  PLT 166 167   Lab Results  Component Value Date   TSH 2.36 05/05/2020   Lab Results  Component Value Date   HGBA1C 5.5 01/09/2020   Lab Results  Component Value Date   CHOL 169 01/09/2020   HDL 34 (L) 01/09/2020   LDLCALC 119 (H) 01/09/2020   TRIG 80 01/09/2020   CHOLHDL 5.0 01/09/2020     Assessment/Plan  1. Essential hypertension -  BPs stable, continue losartan  2. Late effects of cerebral ischemic stroke -   Has left-sided weakness,  3. Contracture of left knee -   He is having restorative splinting to left knee  4. Late onset Alzheimer's disease with behavioral disturbance (HCC) -  BIMS score 0/15 - Continue memantine and supportive care     Family/ staff Communication: Discussed plan of care with charge nurse.  Labs/tests  ordered: None  Goals of care:   Long-term care   Kenard Gower, DNP, MSN, FNP-BC [facility] [phone] (Monday-Friday 8:00 a.m. - 5:00 p.m.) [phone] (after hours)

## 2021-03-04 ENCOUNTER — Non-Acute Institutional Stay (SKILLED_NURSING_FACILITY): Payer: Medicare Other | Admitting: Adult Health

## 2021-03-04 ENCOUNTER — Encounter: Payer: Self-pay | Admitting: Adult Health

## 2021-03-04 DIAGNOSIS — F0280 Dementia in other diseases classified elsewhere without behavioral disturbance: Secondary | ICD-10-CM

## 2021-03-04 DIAGNOSIS — Z7189 Other specified counseling: Secondary | ICD-10-CM | POA: Diagnosis not present

## 2021-03-04 DIAGNOSIS — Z8673 Personal history of transient ischemic attack (TIA), and cerebral infarction without residual deficits: Secondary | ICD-10-CM | POA: Diagnosis not present

## 2021-03-04 DIAGNOSIS — I10 Essential (primary) hypertension: Secondary | ICD-10-CM | POA: Diagnosis not present

## 2021-03-04 DIAGNOSIS — G309 Alzheimer's disease, unspecified: Secondary | ICD-10-CM | POA: Diagnosis not present

## 2021-03-04 NOTE — Progress Notes (Signed)
Location:  [facility] Room Number: 315/A Place of Service:  SNF (31) Provider:  Kenard Gower, DNP, FNP-BC  Patient Care Team: Pecola Lawless, MD as PCP - General (Internal Medicine) [name], [name], NP as Nurse Practitioner (Internal Medicine) Rehab, [facility] (Skilled Nursing Facility)  Extended Emergency Contact Information Primary Emergency Contact: Dennies, [place] Address: [address]          [place] [address] [place] of [place] Home Phone: [phone] Mobile Phone: [phone] Relation: Spouse Secondary Emergency Contact: Aadon, [place] Mobile Phone: [phone] Relation: Son  Code Status:  DNR  Goals of care: Advanced Directive information Advanced Directives 03/04/2021  Does Patient Have a Medical Advance Directive? Yes  Type of Estate agent of [place];Out of facility DNR (pink MOST or yellow form)  Does patient want to make changes to medical advance directive? No - Patient declined  Copy of Healthcare Power of Attorney in Chart? Yes - validated most recent copy scanned in chart (See row information)  Pre-existing out of facility DNR order (yellow form or pink MOST form) Yellow form placed in chart (order not valid for inpatient use)     Chief Complaint  Patient presents with  . Acute Visit    Care Plan Meeting     HPI:  Pt is a 85 y.o. male who had a care plan meeting today. He is a long-term care resident of [facility]. The meeting was attended by MDS Coordinator, social worker, NP, [employer], son and wife. Resident remains to be DNR. Discussed medications, vital signs and weights. Discussed that resident sometimes refuses to have brace to left knee which help minimize contraction. "He used to be a Animator and always fidgets with things," according to wife. Son was wanting to know how to transition from private to [place]. Business  person will call them regarding this matter. The meeting lasted for 25 minutes.   Past Medical History:  Diagnosis Date  . Alzheimer disease (HCC) 09/25/2019   Past Surgical History:  Procedure Laterality Date  . APPENDECTOMY    . BACK SURGERY    . CHOLECYSTECTOMY    . HERNIA REPAIR      Allergies  Allergen Reactions  . Demerol [Meperidine] Nausea And Vomiting    Outpatient Encounter Medications as of 03/04/2021  Medication Sig  . acetaminophen (TYLENOL) 325 MG tablet Take 650 mg by mouth every 6 (six) hours as needed.  Marland Kitchen aspirin EC 81 MG EC tablet Take 1 tablet (81 mg total) by mouth daily.  Marland Kitchen atorvastatin (LIPITOR) 40 MG tablet Take 1 tablet (40 mg total) by mouth daily at 6 PM.  . bisacodyl (DULCOLAX) 10 MG suppository Place 10 mg rectally as needed for moderate constipation.  Marland Kitchen losartan (COZAAR) 25 MG tablet Take 25 mg by mouth daily.  . magnesium hydroxide (MILK OF MAGNESIA) 400 MG/5ML suspension If no BM in 3 days, give 30 cc Milk of Magnesium p.o. x 1 dose in 24 hours as needed (Do not use standing constipation orders for residents with renal failure CFR less than 30. Contact MD for orders) (Physician Order)  . memantine (NAMENDA) 10 MG tablet Take 10 mg by mouth 2 (two) times daily.   . Menthol, Topical Analgesic, (BIOFREEZE) 4 % GEL Apply 1 application topically in the morning, at noon, and at bedtime.  . Nutritional Supplement LIQD Take 120 mLs by mouth in the morning, at noon, and at bedtime. MedPass  . Nutritional  Supplements (NUTRITIONAL SUPPLEMENT PO) Take 1 each by mouth in the morning and at bedtime. Magic Cup  . senna-docusate (SENOKOT-S) 8.6-50 MG tablet Take 1 tablet by mouth at bedtime as needed for mild constipation.   No facility-administered encounter medications on file as of 03/04/2021.    Review of Systems  Unable to obtain due to dementia.    Immunization History  Administered Date(s) Administered  . Influenza-Unspecified 09/27/2019, 10/16/2020  .  Moderna Sars-Covid-2 Vaccination 05/05/2020, 05/30/2020, 12/02/2020  . PPD Test 01/09/2020   Pertinent  Health Maintenance Due  Topic Date Due  . PNA vac Low Risk Adult (1 of 2 - PCV13) Never done  . INFLUENZA VACCINE  Completed   No flowsheet data found.   Vitals:   03/04/21 1511  BP: 136/64  Pulse: 78  Resp: 20  Temp: 97.7 F (36.5 C)  SpO2: 97%  Weight: 161 lb 3.2 oz (73.1 kg)  Height: 6' (1.829 m)   Body mass index is 21.86 kg/m.  Physical Exam  GENERAL APPEARANCE: Well nourished. In no acute distress. Normal body habitus SKIN:  Skin is warm and dry.  MOUTH and THROAT: Lips are without lesions. Oral mucosa is moist and without lesions. Tongue is normal in shape, size, and color and without lesions RESPIRATORY: Breathing is even & unlabored, BS CTAB CARDIAC: RRR, no murmur,no extra heart sounds, no edema GI: Abdomen soft, normal BS, no masses, no tenderness NEUROLOGICAL: There is no tremor. Nonverbal. Left-sided weakness. PSYCHIATRIC:  Affect and behavior are appropriate   Labs reviewed: Recent Labs    05/05/20 0000 07/09/20 0000  NA 146 144  K 3.8 3.8  CL 109* 107  CO2 24* 27*  BUN 13 19  CREATININE 0.9 0.9  CALCIUM 9.0 8.9   Recent Labs    05/05/20 0000  AST 29  ALT 20  ALKPHOS 256*  ALBUMIN 3.3*   Recent Labs    05/05/20 0000 07/18/20 0000  WBC 8.6 6.7  NEUTROABS 5 4  HGB 12.4* 11.9*  HCT 38* 36*  PLT 166 167   Lab Results  Component Value Date   TSH 2.36 05/05/2020   Lab Results  Component Value Date   HGBA1C 5.5 01/09/2020   Lab Results  Component Value Date   CHOL 169 01/09/2020   HDL 34 (L) 01/09/2020   LDLCALC 119 (H) 01/09/2020   TRIG 80 01/09/2020   CHOLHDL 5.0 01/09/2020     Assessment/Plan  1. Advance care planning -   Remains to be DNR -   Discussed medications, vital signs and weights  2. History of CVA (cerebrovascular accident) -   Stable, continue aspirin and atorvastatin  3. Essential hypertension -    BPs stable, continue losartan  4. Alzheimer disease (HCC) -   Continue memantine and supportive care     Family/ staff Communication: Discussed plan of care with son, wife and IDT.  Labs/tests ordered: None  Goals of care:   Long-term care   Kenard Gower, DNP, MSN, FNP-BC [facility] [phone] (Monday-Friday 8:00 a.m. - 5:00 p.m.) [phone] (after hours)

## 2021-03-05 LAB — BASIC METABOLIC PANEL
BUN: 26 — AB (ref 4–21)
CO2: 25 — AB (ref 13–22)
Chloride: 108 (ref 99–108)
Creatinine: 1 (ref 0.6–1.3)
Glucose: 106
Potassium: 4.3 (ref 3.4–5.3)
Sodium: 143 (ref 137–147)

## 2021-03-05 LAB — COMPREHENSIVE METABOLIC PANEL: Calcium: 8.9 (ref 8.7–10.7)

## 2021-03-09 ENCOUNTER — Non-Acute Institutional Stay (SKILLED_NURSING_FACILITY): Payer: Medicare Other | Admitting: Adult Health

## 2021-03-09 ENCOUNTER — Encounter: Payer: Self-pay | Admitting: Adult Health

## 2021-03-09 DIAGNOSIS — Z8673 Personal history of transient ischemic attack (TIA), and cerebral infarction without residual deficits: Secondary | ICD-10-CM

## 2021-03-09 DIAGNOSIS — G309 Alzheimer's disease, unspecified: Secondary | ICD-10-CM

## 2021-03-09 DIAGNOSIS — I10 Essential (primary) hypertension: Secondary | ICD-10-CM

## 2021-03-09 DIAGNOSIS — N182 Chronic kidney disease, stage 2 (mild): Secondary | ICD-10-CM | POA: Diagnosis not present

## 2021-03-09 DIAGNOSIS — F0280 Dementia in other diseases classified elsewhere without behavioral disturbance: Secondary | ICD-10-CM

## 2021-03-09 NOTE — Progress Notes (Signed)
Location:  [facility] Room Number: 315A Place of Service:  SNF (31) Provider:  Kenard Gower, DNP, FNP-BC  Patient Care Team: Pecola Lawless, MD as PCP - General (Internal Medicine) [name], [name], NP as Nurse Practitioner (Internal Medicine) Rehab, [facility] (Skilled Nursing Facility)  Extended Emergency Contact Information Primary Emergency Contact: Kyle [name], [place] Address: [address]          [place] [address] Kyle [name] of [place] Home Phone: [phone] Mobile Phone: [phone] Relation: Spouse Secondary Emergency Contact: Kyle [name], Kyle [name] Mobile Phone: [phone] Relation: Son  Code Status:  DNR  Goals of care: Advanced Directive information Advanced Directives 03/04/2021  Does Patient Have a Medical Advance Directive? Yes  Type of Estate agent of [place];Out of facility DNR (pink MOST or yellow form)  Does patient want to make changes to medical advance directive? No - Patient declined  Copy of Healthcare Power of Attorney in Chart? Yes - validated most recent copy scanned in chart (See row information)  Pre-existing out of facility DNR order (yellow form or pink MOST form) Yellow form placed in chart (order not valid for inpatient use)     Chief Complaint  Patient presents with  . Medical Management of Chronic Issues  . Health Maintenance    PCV13 - refused    HPI:  Pt is a 85 y.o. male seen today for medical management of chronic diseases. He is a long-term care resident of [facility]. He has a PMH of advanced dementia, CVA, DVT and COVID-19 infection. He was seen in his room today. 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 Latest creatinine 0.98 and GFR 70.92. He has left-sided weakness as a residual of history of CVA. He participates in restorative program such as 1) PROM LLE, and 2)  Splint/brace care to left knee. SBPs ranging from 111-146,  outlier 168.  He takes losartan 25 mg daily for hypertension.   Past Medical History:  Diagnosis Date  . Alzheimer disease (HCC) 09/25/2019   Past Surgical History:  Procedure Laterality Date  . APPENDECTOMY    . BACK SURGERY    . CHOLECYSTECTOMY    . HERNIA REPAIR      Allergies  Allergen Reactions  . Demerol [Meperidine] Nausea And Vomiting    Outpatient Encounter Medications as of 03/09/2021  Medication Sig  . acetaminophen (TYLENOL) 325 MG tablet Take 650 mg by mouth every 6 (six) hours as needed.  Marland Kitchen aspirin EC 81 MG EC tablet Take 1 tablet (81 mg total) by mouth daily.  Marland Kitchen atorvastatin (LIPITOR) 40 MG tablet Take 1 tablet (40 mg total) by mouth daily at 6 PM.  . bisacodyl (DULCOLAX) 10 MG suppository Place 10 mg rectally as needed for moderate constipation.  Marland Kitchen losartan (COZAAR) 25 MG tablet Take 25 mg by mouth daily.  . magnesium hydroxide (MILK OF MAGNESIA) 400 MG/5ML suspension If no BM in 3 days, give 30 cc Milk of Magnesium p.o. x 1 dose in 24 hours as needed (Do not use standing constipation orders for residents with renal failure CFR less than 30. Contact MD for orders) (Physician Order)  . memantine (NAMENDA) 10 MG tablet Take 10 mg by mouth 2 (two) times daily.   . Menthol, Topical Analgesic, (BIOFREEZE) 4 % GEL Apply 1 application topically in the morning, at noon, and at bedtime.  . Nutritional Supplement LIQD Take 120 mLs by mouth in the morning, at noon, and at bedtime.  MedPass  . Nutritional Supplements (NUTRITIONAL SUPPLEMENT PO) Take 1 each by mouth in the morning and at bedtime. Magic Cup  . senna-docusate (SENOKOT-S) 8.6-50 MG tablet Take 1 tablet by mouth at bedtime as needed for mild constipation.   No facility-administered encounter medications on file as of 03/09/2021.    Review of Systems Unable to obtain due to being nonverbal.   Immunization History  Administered Date(s) Administered  . Influenza-Unspecified 09/27/2019, 10/16/2020  . Moderna  Sars-Covid-2 Vaccination 05/05/2020, 05/30/2020, 12/02/2020  . PPD Test 01/09/2020   Pertinent  Health Maintenance Due  Topic Date Due  . PNA vac Low Risk Adult (1 of 2 - PCV13) Never done  . INFLUENZA VACCINE  Completed   No flowsheet data found.   Vitals:   03/09/21 1621  BP: (!) 143/93  Pulse: (!) 57  Resp: 18  Temp: (!) 97 F (36.1 C)  Weight: 161 lb (73 kg)  Height: 6' (1.829 m)   Body mass index is 21.84 kg/m.  Physical Exam  GENERAL APPEARANCE: Well nourished. In no acute distress.  SKIN:  Skin is warm and dry.  MOUTH and THROAT: Lips are without lesions. Oral mucosa is moist and without lesions.  RESPIRATORY: Breathing is even & unlabored, BS CTAB CARDIAC: RRR, no murmur,no extra heart sounds, no edema GI: Abdomen soft, normal BS, no masses, no tenderness EXTREMITIES:  Left knee contracted NEUROLOGICAL: There is no tremor. Nonverbal. PSYCHIATRIC:  Affect and behavior are appropriate  Labs reviewed: Recent Labs    05/05/20 0000 07/09/20 0000 03/05/21 0000  NA 146 144 143  K 3.8 3.8 4.3  CL 109* 107 108  CO2 24* 27* 25*  BUN 13 19 26*  CREATININE 0.9 0.9 1.0  CALCIUM 9.0 8.9 8.9   Recent Labs    05/05/20 0000  AST 29  ALT 20  ALKPHOS 256*  ALBUMIN 3.3*   Recent Labs    05/05/20 0000 07/18/20 0000  WBC 8.6 6.7  NEUTROABS 5 4  HGB 12.4* 11.9*  HCT 38* 36*  PLT 166 167   Lab Results  Component Value Date   TSH 2.36 05/05/2020   Lab Results  Component Value Date   HGBA1C 5.5 01/09/2020   Lab Results  Component Value Date   CHOL 169 01/09/2020   HDL 34 (L) 01/09/2020   LDLCALC 119 (H) 01/09/2020   TRIG 80 01/09/2020   CHOLHDL 5.0 01/09/2020     Assessment/Plan  1. Chronic kidney disease, stage II (mild) -  Creatinine 0.98 and GFR 70.92 -   Will monitor  2. History of CVA (cerebrovascular accident) -Stable, continue aspirin and atorvastatin  3. Essential hypertension -   BPs is stable, continue losartan  4. Alzheimer  disease (HCC) -   Continue memantine -    Continue supportive care     Family/ staff Communication: Discussed plan of care with charge nurse.  Labs/tests ordered: CBC, CMP and lipid panel  Goals of care:   Long-term care   Kenard Gower, DNP, MSN, FNP-BC [facility] and Adult Medicine [phone] (Monday-Friday 8:00 a.m. - 5:00 p.m.) [phone] (after hours)

## 2021-03-10 LAB — CBC AND DIFFERENTIAL
HCT: 36 — AB (ref 41–53)
Hemoglobin: 12.3 — AB (ref 13.5–17.5)
Platelets: 184 (ref 150–399)
WBC: 7.7

## 2021-03-10 LAB — BASIC METABOLIC PANEL
BUN: 27 — AB (ref 4–21)
CO2: 28 — AB (ref 13–22)
Chloride: 108 (ref 99–108)
Creatinine: 1 (ref 0.6–1.3)
Glucose: 108
Potassium: 4.4 (ref 3.4–5.3)
Sodium: 143 (ref 137–147)

## 2021-03-10 LAB — LIPID PANEL
Cholesterol: 105 (ref 0–200)
HDL: 44 (ref 35–70)
LDL Cholesterol: 54
Triglycerides: 33 — AB (ref 40–160)

## 2021-03-10 LAB — COMPREHENSIVE METABOLIC PANEL
Albumin: 3.5 (ref 3.5–5.0)
Calcium: 8.8 (ref 8.7–10.7)
GFR calc Af Amer: 76.49
GFR calc non Af Amer: 66
Globulin: 2.5

## 2021-03-10 LAB — HEPATIC FUNCTION PANEL
ALT: 12 (ref 10–40)
AST: 16 (ref 14–40)
Bilirubin, Total: 0.4

## 2021-03-10 LAB — CBC: RBC: 3.89 (ref 3.87–5.11)

## 2021-03-29 ENCOUNTER — Telehealth: Payer: Self-pay | Admitting: Adult Health

## 2021-03-29 LAB — CBC: RBC: 4.1 (ref 3.87–5.11)

## 2021-03-29 LAB — COMPREHENSIVE METABOLIC PANEL
Albumin: 3.5 (ref 3.5–5.0)
Calcium: 8.6 — AB (ref 8.7–10.7)

## 2021-03-29 LAB — BASIC METABOLIC PANEL
BUN: 31 — AB (ref 4–21)
CO2: 25 — AB (ref 13–22)
Chloride: 109 — AB (ref 99–108)
Creatinine: 1.9 — AB (ref 0.6–1.3)
Glucose: 156
Potassium: 4.1 (ref 3.4–5.3)
Sodium: 142 (ref 137–147)

## 2021-03-29 LAB — HEPATIC FUNCTION PANEL
ALT: 11 (ref 10–40)
AST: 16 (ref 14–40)
Alkaline Phosphatase: 126 — AB (ref 25–125)
Bilirubin, Total: 0.9

## 2021-03-29 LAB — CBC AND DIFFERENTIAL
HCT: 38 — AB (ref 41–53)
Hemoglobin: 12.6 — AB (ref 13.5–17.5)
Platelets: 184 (ref 150–399)
WBC: 15.2

## 2021-03-29 NOTE — Telephone Encounter (Signed)
Nurse called from [facility] to report CXR and KUB results. She stated that for the last two nights Mr. Schiele has vomited at night. They were concerned for aspiration. He has normal 02 sats on room air and no fever. No cough. CXR showed bilateral patchy opacities in both lungs. KUB showed mild gaseous distention and a moderate amt of stool in the colon but no free air and no obstruction. Pt is not currently vomiting. Eating well during the day. No abd pain or fever. Labs pending. Order given to start Doxycycline 100 mg bid x 7 days with food and to give one dulcolax supp. Possibly change to Augmentin if GI situation improves. Will forward this note to facility provider to evaluate resident.

## 2021-03-30 ENCOUNTER — Non-Acute Institutional Stay (SKILLED_NURSING_FACILITY): Payer: Medicare Other | Admitting: Adult Health

## 2021-03-30 ENCOUNTER — Encounter: Payer: Self-pay | Admitting: Adult Health

## 2021-03-30 DIAGNOSIS — K5901 Slow transit constipation: Secondary | ICD-10-CM

## 2021-03-30 DIAGNOSIS — N179 Acute kidney failure, unspecified: Secondary | ICD-10-CM | POA: Diagnosis not present

## 2021-03-30 DIAGNOSIS — J189 Pneumonia, unspecified organism: Secondary | ICD-10-CM

## 2021-03-30 NOTE — Progress Notes (Signed)
Location:  [facility] Room Number: 315/A Place of Service:  SNF (31) Provider:  Kenard Gower, DNP, FNP-BC  Patient Care Team: Pecola Lawless, MD as PCP - General (Internal Medicine) [name], [name], NP as Nurse Practitioner (Internal Medicine) Rehab, [facility] (Skilled Nursing Facility)  Extended Emergency Contact Information Primary Emergency Contact: Hasnain, [place] Address: [address]          [place] [address] [place] of [place] Home Phone: [phone] Mobile Phone: [phone] Relation: Spouse Secondary Emergency Contact: Bram, [place] Mobile Phone: [phone] Relation: Son  Code Status:  DNR  Goals of care: Advanced Directive information Advanced Directives 03/30/2021  Does Patient Have a Medical Advance Directive? Yes  Type of Estate agent of [place];Out of facility DNR (pink MOST or yellow form)  Does patient want to make changes to medical advance directive? No - Patient declined  Copy of Healthcare Power of Attorney in Chart? Yes - validated most recent copy scanned in chart (See row information)  Pre-existing out of facility DNR order (yellow form or pink MOST form) Yellow form placed in chart (order not valid for inpatient use)     Chief Complaint  Patient presents with  . Acute Visit    Nausea and vomiting    HPI:  Pt is a 85 y.o. male seen today for follow up of nausea and vomiting. Chest x-ray showed patchy perihilar interstitial opacities bilaterally and KUB showed moderate amount of stool.  She was started on doxycycline 100 mg twice a day x7 days for pneumonia.  Labs done are as follows: Sodium 144, BUN 31, creatinine 1.57, WBC 15.2, hemoglobin 12.6 and hematocrit 38.4. No reported fever nor SOB. He is a long-term care resident of [facility].   Past Medical History:  Diagnosis Date  . Alzheimer disease (HCC) 09/25/2019   Past Surgical  History:  Procedure Laterality Date  . APPENDECTOMY    . BACK SURGERY    . CHOLECYSTECTOMY    . HERNIA REPAIR      Allergies  Allergen Reactions  . Demerol [Meperidine] Nausea And Vomiting    Outpatient Encounter Medications as of 03/30/2021  Medication Sig  . acetaminophen (TYLENOL) 325 MG tablet Take 650 mg by mouth every 6 (six) hours as needed.  Marland Kitchen aspirin EC 81 MG EC tablet Take 1 tablet (81 mg total) by mouth daily.  Marland Kitchen atorvastatin (LIPITOR) 40 MG tablet Take 1 tablet (40 mg total) by mouth daily at 6 PM.  . bisacodyl (DULCOLAX) 10 MG suppository Place 10 mg rectally as needed for moderate constipation.  Marland Kitchen doxycycline (DORYX) 100 MG EC tablet Take 100 mg by mouth 2 (two) times daily.  Marland Kitchen losartan (COZAAR) 25 MG tablet Take 25 mg by mouth daily.  . magnesium hydroxide (MILK OF MAGNESIA) 400 MG/5ML suspension If no BM in 3 days, give 30 cc Milk of Magnesium p.o. x 1 dose in 24 hours as needed (Do not use standing constipation orders for residents with renal failure CFR less than 30. Contact MD for orders) (Physician Order)  . memantine (NAMENDA) 10 MG tablet Take 10 mg by mouth 2 (two) times daily.   . Menthol, Topical Analgesic, (BIOFREEZE) 4 % GEL Apply 1 application topically in the morning, at noon, and at bedtime.  . Nutritional Supplement LIQD Take 120 mLs by mouth in the morning, at noon, and at bedtime. MedPass  . Nutritional Supplements (NUTRITIONAL SUPPLEMENT PO) Take 1 each by mouth in  the morning and at bedtime. Magic Cup  . senna-docusate (SENOKOT-S) 8.6-50 MG tablet Take 1 tablet by mouth at bedtime as needed for mild constipation.   No facility-administered encounter medications on file as of 03/30/2021.    Review of Systems  Unable to obtain due to nonverbal    Immunization History  Administered Date(s) Administered  . Influenza-Unspecified 09/27/2019, 10/16/2020  . Moderna Sars-Covid-2 Vaccination 05/05/2020, 05/30/2020, 12/02/2020  . PPD Test 01/09/2020    Pertinent  Health Maintenance Due  Topic Date Due  . PNA vac Low Risk Adult (1 of 2 - PCV13) Never done  . INFLUENZA VACCINE  07/27/2021   No flowsheet data found.   Vitals:   03/30/21 1040  BP: 122/84  Pulse: 72  Resp: 20  Temp: 98.2 F (36.8 C)  SpO2: 97%  Weight: 173 lb (78.5 kg)  Height: 6' (1.829 m)   Body mass index is 23.46 kg/m.  Physical Exam  GENERAL APPEARANCE: Well nourished. In no acute distress. Normal body habitus SKIN:  Skin is warm and dry.  MOUTH and THROAT: Lips are without lesions. Oral mucosa is moist and without lesions.  RESPIRATORY: Breathing is even & unlabored, BS CTAB CARDIAC: RRR, no murmur,no extra heart sounds, no edema GI: Abdomen soft, normal BS, no masses, no tenderness NEUROLOGICAL: There is no tremor. Nonverbal. Confused X 3. PSYCHIATRIC:  Affect and behavior are appropriate.  Labs reviewed: Recent Labs    03/05/21 0000 03/10/21 0000 03/29/21 0000  NA 143 143 142  K 4.3 4.4 4.1  CL 108 108 109*  CO2 25* 28* 25*  BUN 26* 27* 31*  CREATININE 1.0 1.0 1.9*  CALCIUM 8.9 8.8 8.6*   Recent Labs    05/05/20 0000 03/10/21 0000 03/29/21 0000  AST 29 16 16   ALT 20 12 11   ALKPHOS 256*  --  126*  ALBUMIN 3.3* 3.5 3.5   Recent Labs    05/05/20 0000 07/18/20 0000 03/10/21 0000 03/29/21 0000  WBC 8.6 6.7 7.7 15.2  NEUTROABS 5 4  --   --   HGB 12.4* 11.9* 12.3* 12.6*  HCT 38* 36* 36* 38*  PLT 166 167 184 184   Lab Results  Component Value Date   TSH 2.36 05/05/2020   Lab Results  Component Value Date   HGBA1C 5.5 01/09/2020   Lab Results  Component Value Date   CHOL 105 03/10/2021   HDL 44 03/10/2021   LDLCALC 54 03/10/2021   TRIG 33 (A) 03/10/2021   CHOLHDL 5.0 01/09/2020     Assessment/Plan  1. AKI (acute kidney injury) (HCC) Lab Results  Component Value Date   CREATININE 1.9 (A) 03/29/2021   -  Creatinine up from 1.04 -  Will start on 0.45 NS IV fluid @ 70 ml/hour X 1L  2. Pneumonia of both lungs  due to infectious organism, unspecified part of lung -Continue doxycycline 100 mg twice a day x7 days  3. Slow transit constipation -Start senna S8 0.6-50 mg 2 tabs BID x5 days then at bedtime     Family/ staff Communication: Discussed plan of care with resident and charge nurse.  Labs/tests ordered:  BMP after IVF  Goals of care:   Long-term care   05/29/2021, DNP, MSN, FNP-BC [facility] and Adult Medicine [phone] (Monday-Friday 8:00 a.m. - 5:00 p.m.) [phone] (after hours)

## 2021-04-13 LAB — HEPATIC FUNCTION PANEL
ALT: 22 (ref 10–40)
AST: 20 (ref 14–40)
Alkaline Phosphatase: 155 — AB (ref 25–125)

## 2021-04-13 LAB — COMPREHENSIVE METABOLIC PANEL: Albumin: 3.5 (ref 3.5–5.0)

## 2021-04-16 ENCOUNTER — Encounter: Payer: Self-pay | Admitting: Adult Health

## 2021-04-16 ENCOUNTER — Non-Acute Institutional Stay (SKILLED_NURSING_FACILITY): Payer: Medicare Other | Admitting: Adult Health

## 2021-04-16 DIAGNOSIS — Z8673 Personal history of transient ischemic attack (TIA), and cerebral infarction without residual deficits: Secondary | ICD-10-CM

## 2021-04-16 DIAGNOSIS — I10 Essential (primary) hypertension: Secondary | ICD-10-CM | POA: Diagnosis not present

## 2021-04-16 DIAGNOSIS — G309 Alzheimer's disease, unspecified: Secondary | ICD-10-CM | POA: Diagnosis not present

## 2021-04-16 DIAGNOSIS — R748 Abnormal levels of other serum enzymes: Secondary | ICD-10-CM | POA: Diagnosis not present

## 2021-04-16 DIAGNOSIS — F0280 Dementia in other diseases classified elsewhere without behavioral disturbance: Secondary | ICD-10-CM

## 2021-04-16 NOTE — Progress Notes (Signed)
Location:  [place] Room Number: [place] of Service:  SNF (31) Provider:  Durenda Age, DNP, FNP-BC  Patient Care Team: Hendricks Limes, MD as PCP - General (Internal Medicine) [name], Senaida Lange, NP as Nurse Practitioner (Internal Medicine) Rehab, [facility] ([place])  Extended Emergency Contact Information Primary Emergency Contact: Roshon, Duell Address: [place] [address] [address]itter of [place] Phone: [phone] Mobile Phone: [phone] Relation: Spouse Secondary Emergency Contact: Arno, Cullers Mobile Phone: [phone] Relation: Son  Code Status:  DNR  Goals of care: Advanced Directive information Advanced Directives 03/30/2021  Does Patient Have a Medical Advance Directive? Yes  Type of Paramedic of [place];Out of facility DNR (pink MOST or yellow form)  Does patient want to make changes to medical advance directive? No - Patient declined  Copy of [place] in Chart? Yes - validated most recent copy scanned in chart (See row information)  Pre-existing out of facility DNR order (yellow form or pink MOST form) Yellow form placed in chart (order not valid for inpatient use)     Chief Complaint  Patient presents with  . Medical Management of Chronic Issues    Routine Visit    HPI:  Pt is a 85 y.o. male seen today for medical management of chronic diseases. He is a long-term care resident of [facility]. He has a PMH of advanced dementia, CVA, DVT and COVID-19 infection. Latest alk phos 155, elevated. Has not complained of RUQ abdominal pain. SBPs ranging from 119 to 150, with outlier 101. He takes Losartan 25 mg daily for hypertension.  He takes ASA EC 81 mg daily and Atorvastatin 40 mg daily for history of CVA. He has Left-sided weakness due to history of CVA.   Past Medical History:   Diagnosis Date  . Alzheimer disease ([place]) 09/25/2019   Past Surgical History:  Procedure Laterality Date  . APPENDECTOMY    . BACK SURGERY    . CHOLECYSTECTOMY    . HERNIA REPAIR      Allergies  Allergen Reactions  . Demerol [Meperidine] Nausea And Vomiting    Outpatient Encounter Medications as of 04/16/2021  Medication Sig  . acetaminophen (TYLENOL) 325 MG tablet Take 650 mg by mouth every 6 (six) hours as needed.  Marland Kitchen aspirin EC 81 MG EC tablet Take 1 tablet (81 mg total) by mouth daily.  Marland Kitchen atorvastatin (LIPITOR) 40 MG tablet Take 1 tablet (40 mg total) by mouth daily at 6 PM.  . bisacodyl (DULCOLAX) 10 MG suppository Place 10 mg rectally as needed for moderate constipation.  Marland Kitchen losartan (COZAAR) 25 MG tablet Take 25 mg by mouth daily.  . magnesium hydroxide (MILK OF MAGNESIA) 400 MG/5ML suspension If no BM in 3 days, give 30 cc Milk of Magnesium p.o. x 1 dose in 24 hours as needed (Do not use standing constipation orders for residents with renal failure CFR less than 30. Contact MD for orders) (Physician Order)  . memantine (NAMENDA) 10 MG tablet Take 10 mg by mouth 2 (two) times daily.   . Menthol, Topical Analgesic, (BIOFREEZE) 4 % GEL Apply 1 application topically in the morning, at noon, and at bedtime.  . Nutritional Supplement LIQD Take 120 mLs by mouth in the morning, at noon, and at bedtime. MedPass  . Nutritional Supplements (NUTRITIONAL SUPPLEMENT PO) Take 1 each by mouth in the morning  and at bedtime. Magic Cup  . senna-docusate (SENOKOT-S) 8.6-50 MG tablet Take 1 tablet by mouth at bedtime as needed for mild constipation.   No facility-administered encounter medications on file as of 04/16/2021.    Review of Systems  Unable to obtain due to nonverbal    Immunization History  Administered Date(s) Administered  . Influenza-Unspecified 09/27/2019, 10/16/2020  . Moderna Sars-Covid-2 Vaccination 05/05/2020, 05/30/2020, 12/02/2020  . PPD Test 01/09/2020   Pertinent   Health Maintenance Due  Topic Date Due  . PNA vac Low Risk Adult (1 of 2 - PCV13) Never done  . INFLUENZA VACCINE  07/27/2021   No flowsheet data found.   Vitals:   04/16/21 1000  BP: 119/63  Pulse: 60  Resp: 18  Temp: (!) 97.2 F (36.2 C)  Weight: 168 lb (76.2 kg)  Height: 6' (1.829 m)   Body mass index is 22.78 kg/m.  Physical Exam  GENERAL APPEARANCE: Well nourished. In no acute distress.  SKIN:  Skin is warm and dry.  MOUTH and THROAT: Lips are without lesions. Oral mucosa is moist and without lesions.  RESPIRATORY: Breathing is even & unlabored, BS CTAB CARDIAC: RRR, no murmur,no extra heart sounds, no edema GI: Abdomen soft, normal BS, no masses, no tenderness EXTREMITIES:  Left knee contracted. NEUROLOGICAL: There is no tremor. Speech is clear. Left-sided weakness. PSYCHIATRIC: Affect and behavior are appropriate  Labs reviewed: Recent Labs    03/05/21 0000 03/10/21 0000 03/29/21 0000  NA 143 143 142  K 4.3 4.4 4.1  CL 108 108 109*  CO2 25* 28* 25*  BUN 26* 27* 31*  CREATININE 1.0 1.0 1.9*  CALCIUM 8.9 8.8 8.6*   Recent Labs    05/05/20 0000 03/10/21 0000 03/29/21 0000  AST _0 ALT _1 ALKPHOS 256*  --  126*  ALBUMIN 3.3* 3.5 3.5   Recent Labs    05/05/20 0000 07/18/20 0000 03/10/21 0000 03/29/21 0000  WBC 8.6 6.7 7.7 15.2  NEUTROABS 5 4  --   --   HGB 12.4* 11.9* 12.3* 12.6*  HCT 38* 36* 36* 38*  PLT 166 167 184 184   Lab Results  Component Value Date   TSH 2.36 05/05/2020   Lab Results  Component Value Date   HGBA1C 5.5 01/09/2020   Lab Results  Component Value Date   CHOL 105 03/10/2021   HDL 44 03/10/2021   LDLCALC 54 03/10/2021   TRIG 33 (A) 03/10/2021   CHOLHDL 5.0 01/09/2020     Assessment/Plan  1. Elevated alkaline phosphatase level Lab Results  Component Value Date   ALKPHOS 126 (A) 03/29/2021   -  Alk phos 155, 04/13/21 -   Will re-check in a month  2. Essential hypertension -  BPs stable,  continue Losartan  3. History of CVA (cerebrovascular accident) -  Has left-sided weakness, continue ASA and Atorvastatin  4. Alzheimer disease ([place]) -  BIMS score 0/15, ranging in severe cognitive impairment -  Continue Memantine     Family/ staff Communication:   Discussed plan of care with charge nurse.  Labs/tests ordered:  None  Goals of care:   Long-term care   Durenda Age, DNP, MSN, FNP-BC [facility] and Adult Medicine [phone] (Monday-Friday 8:00 a.m. - 5:00 p.m.) [phone] (after hours)

## 2021-05-12 ENCOUNTER — Non-Acute Institutional Stay (SKILLED_NURSING_FACILITY): Payer: Medicare Other | Admitting: Adult Health

## 2021-05-12 ENCOUNTER — Encounter: Payer: Self-pay | Admitting: Adult Health

## 2021-05-12 DIAGNOSIS — U071 COVID-19: Secondary | ICD-10-CM | POA: Diagnosis not present

## 2021-05-12 NOTE — Progress Notes (Signed)
Location:  [facility] Room Number: 315-A Place of Service:  SNF (31) Provider:  Kenard Gower, DNP, FNP-BC  Patient Care Team: Pecola Lawless, MD as PCP - General (Internal Medicine) [name], [name], NP as Nurse Practitioner (Internal Medicine) Rehab, [facility] (Skilled Nursing Facility)  Extended Emergency Contact Information Primary Emergency Contact: Adalid, Beckmann Address: [address]          [place] [address] Darden Amber of [place] Home Phone: [phone] Mobile Phone: [phone] Relation: Spouse Secondary Emergency Contact: Kilo, Eshelman Mobile Phone: [phone] Relation: Son  Code Status:  DNR  Goals of care: Advanced Directive information Advanced Directives 05/12/2021  Does Patient Have a Medical Advance Directive? Yes  Type of Estate agent of [place];Out of facility DNR (pink MOST or yellow form)  Does patient want to make changes to medical advance directive? No - Patient declined  Copy of Healthcare Power of Attorney in Chart? Yes - validated most recent copy scanned in chart (See row information)  Pre-existing out of facility DNR order (yellow form or pink MOST form) -     Chief Complaint  Patient presents with  . Acute Visit    Covid- 19 Positive     HPI:  Pt is a 85 y.o. male seen today for  Having a positive COVID-19 test on a routine facility testing of all residents. He has 3 COVID-19 vaccine.He is asymptomatic -  No cough, fever nor SOB. He is a long-term care resident of [facility].  Past Medical History:  Diagnosis Date  . Alzheimer disease (H[facility]) 09/25/2019   Past Surgical History:  Procedure Laterality Date  . APPENDECTOMY    . BACK SURGERY    . CHOLECYSTECTOMY    . HERNIA REPAIR      Allergies  Allergen Reactions  . Demerol [Meperidine] Nausea And Vomiting    Outpatient Encounter Medications as of 05/12/2021  Medication Sig   . aspirin EC 81 MG EC tablet Take 1 tablet (81 mg total) by mouth daily.  Marland Kitchen atorvastatin (LIPITOR) 40 MG tablet Take 1 tablet (40 mg total) by mouth daily at 6 PM.  . bisacodyl (DULCOLAX) 10 MG suppository Place 10 mg rectally as needed for moderate constipation.  Marland Kitchen losartan (COZAAR) 25 MG tablet Take 25 mg by mouth daily.  . magnesium hydroxide (MILK OF MAGNESIA) 400 MG/5ML suspension If no BM in 3 days, give 30 cc Milk of Magnesium p.o. x 1 dose in 24 hours as needed (Do not use standing constipation orders for residents with renal failure CFR less than 30. Contact MD for orders) (Physician Order)  . memantine (NAMENDA) 10 MG tablet Take 10 mg by mouth 2 (two) times daily.   . Menthol, Topical Analgesic, (BIOFREEZE) 4 % GEL Apply 1 application topically in the morning, at noon, and at bedtime.  . Nutritional Supplements (ENSURE PO) Take 237 mLs by mouth daily.  . Nutritional Supplements (NUTRITIONAL SUPPLEMENT PO) Take 1 each by mouth in the morning and at bedtime. Magic Cup  . senna-docusate (SENOKOT-S) 8.6-50 MG tablet Take 1 tablet by mouth at bedtime as needed for mild constipation.  . [DISCONTINUED] acetaminophen (TYLENOL) 325 MG tablet Take 650 mg by mouth every 6 (six) hours as needed.  . [DISCONTINUED] Nutritional Supplement LIQD Take 120 mLs by mouth in the morning, at noon, and at bedtime. MedPass   No facility-administered encounter medications on file as of 05/12/2021.    Review of Systems  Unable  to obtain due to dementia   Immunization History  Administered Date(s) Administered  . Influenza-Unspecified 09/27/2019, 10/16/2020  . Moderna Sars-Covid-2 Vaccination 05/05/2020, 05/30/2020, 12/02/2020  . PPD Test 01/09/2020   Pertinent  Health Maintenance Due  Topic Date Due  . PNA vac Low Risk Adult (1 of 2 - PCV13) Never done  . INFLUENZA VACCINE  07/27/2021   No flowsheet data found.   Vitals:   05/12/21 1608  BP: (!) 159/77  Pulse: (!) 54  Resp: 20  Temp: (!) 97.2  F (36.2 C)  Weight: 164 lb 8 oz (74.6 kg)  Height: 6' (1.829 m)   Body mass index is 22.31 kg/m.  Physical Exam  GENERAL APPEARANCE: Well nourished. In no acute distress. Normal body habitus SKIN:  Skin is warm and dry.  MOUTH and THROAT: Lips are without lesions. Oral mucosa is moist and without lesions.  RESPIRATORY: Breathing is even & unlabored, BS CTAB CARDIAC: RRR, no murmur,no extra heart sounds, no edema GI: Abdomen soft, normal BS, no masses, no tenderness NEUROLOGICAL: There is no tremor. Nonverbal. PSYCHIATRIC:  Affect and behavior are appropriate  Labs reviewed: Recent Labs    03/05/21 0000 03/10/21 0000 03/29/21 0000  NA 143 143 142  K 4.3 4.4 4.1  CL 108 108 109*  CO2 25* 28* 25*  BUN 26* 27* 31*  CREATININE 1.0 1.0 1.9*  CALCIUM 8.9 8.8 8.6*   Recent Labs    03/10/21 0000 03/29/21 0000 04/13/21 0000  AST 16 16 20   ALT 12 11 22   ALKPHOS  --  126* 155*  ALBUMIN 3.5 3.5 3.5   Recent Labs    07/18/20 0000 03/10/21 0000 03/29/21 0000  WBC 6.7 7.7 15.2  NEUTROABS 4  --   --   HGB 11.9* 12.3* 12.6*  HCT 36* 36* 38*  PLT 167 184 184   Lab Results  Component Value Date   TSH 2.36 05/05/2020   Lab Results  Component Value Date   HGBA1C 5.5 01/09/2020   Lab Results  Component Value Date   CHOL 105 03/10/2021   HDL 44 03/10/2021   LDLCALC 54 03/10/2021   TRIG 33 (A) 03/10/2021   CHOLHDL 5.0 01/09/2020     Assessment/Plan  1. Lab test positive for detection of COVID-19 virus -   Asymptomatic -   Will start on guaifenesin 100 mg / 5 mL give 10 mL = 200 mg every 6 hours x 7/9, zinc sulfate 220 mg 1 capsule daily x7 days and vitamin C 500 mg twice a day x7 days      Family/ staff Communication: Discussed plan of care with charge nurse.  Labs/tests ordered: CBC with differentials, BMP, C-reactive protein, D-dimer and chest x-ray PA and lateral  Goals of care:   Long-term care   01/11/2020, DNP, MSN, FNP-BC [facility] and Adult Medicine [phone] (Monday-Friday 8:00 a.m. - 5:00 p.m.) [phone] (after hours)

## 2021-05-13 LAB — BASIC METABOLIC PANEL
BUN: 19 (ref 4–21)
CO2: 26 — AB (ref 13–22)
Chloride: 106 (ref 99–108)
Creatinine: 1.2 (ref 0.6–1.3)
Glucose: 154
Potassium: 5 (ref 3.4–5.3)
Sodium: 145 (ref 137–147)

## 2021-05-13 LAB — CBC AND DIFFERENTIAL
HCT: 38 — AB (ref 41–53)
Hemoglobin: 12.9 — AB (ref 13.5–17.5)
Platelets: 193 (ref 150–399)
WBC: 11.4

## 2021-05-13 LAB — COMPREHENSIVE METABOLIC PANEL
Calcium: 9.6 (ref 8.7–10.7)
GFR calc Af Amer: 61.77
GFR calc non Af Amer: 53.29

## 2021-05-13 LAB — CBC: RBC: 4.15 (ref 3.87–5.11)

## 2021-05-22 LAB — CBC AND DIFFERENTIAL
HCT: 35 — AB (ref 41–53)
Hemoglobin: 12 — AB (ref 13.5–17.5)
Platelets: 185 (ref 150–399)
WBC: 8

## 2021-05-22 LAB — CBC: RBC: 3.76 — AB (ref 3.87–5.11)

## 2021-05-26 ENCOUNTER — Encounter: Payer: Self-pay | Admitting: Adult Health

## 2021-05-26 ENCOUNTER — Non-Acute Institutional Stay (SKILLED_NURSING_FACILITY): Payer: Medicare Other | Admitting: Adult Health

## 2021-05-26 DIAGNOSIS — I10 Essential (primary) hypertension: Secondary | ICD-10-CM

## 2021-05-26 DIAGNOSIS — R7989 Other specified abnormal findings of blood chemistry: Secondary | ICD-10-CM

## 2021-05-26 DIAGNOSIS — U071 COVID-19: Secondary | ICD-10-CM | POA: Diagnosis not present

## 2021-05-26 DIAGNOSIS — Z8673 Personal history of transient ischemic attack (TIA), and cerebral infarction without residual deficits: Secondary | ICD-10-CM | POA: Diagnosis not present

## 2021-05-26 DIAGNOSIS — G309 Alzheimer's disease, unspecified: Secondary | ICD-10-CM

## 2021-05-26 DIAGNOSIS — F0280 Dementia in other diseases classified elsewhere without behavioral disturbance: Secondary | ICD-10-CM

## 2021-05-26 DIAGNOSIS — Z23 Encounter for immunization: Secondary | ICD-10-CM

## 2021-05-26 NOTE — Progress Notes (Signed)
Location:  [facility] Room Number: 315-A Place of Service:  SNF (31) Provider:  Kenard Gower, DNP, FNP-BC  Patient Care Team: Pecola Lawless, MD as PCP - General (Internal Medicine) [name], [name], NP as Nurse Practitioner (Internal Medicine) Rehab, [facility] (Skilled Nursing Facility)  Extended Emergency Contact Information Primary Emergency Contact: Eathon, Valade Address: [address]          [place] [address] Darden Amber of [place] Home Phone: [phone] Mobile Phone: [phone] Relation: Spouse Secondary Emergency Contact: Atilano, Covelli Mobile Phone: [phone] Relation: Son  Code Status:  DNR  Goals of care: Advanced Directive information Advanced Directives 05/26/2021  Does Patient Have a Medical Advance Directive? Yes  Type of Estate agent of [place];Living will;Out of facility DNR (pink MOST or yellow form)  Does patient want to make changes to medical advance directive? No - Patient declined  Copy of Healthcare Power of Attorney in Chart? Yes - validated most recent copy scanned in chart (See row information)  Pre-existing out of facility DNR order (yellow form or pink MOST form) -     Chief Complaint  Patient presents with  . Medical Management of Chronic Issues    Routine Visit.   . Immunizations    Discuss the need for Shingrix Vaccine.    HPI:  Pt is a 85 y.o. male seen today for medical management of chronic diseases. He is a long-term care resident of [facility]. He tested positive for COVID-19 and was treated with Guaifenesin Zinc sulfate, Vitamin C and Doxycycline. D-dimer up from 0.73 to 1.05. ASA 81 mg was discontinued and was started on Eliquis. No SOB was noted. He has 3 COVID-19 vaccines. He refused PCV 13 vaccine. SBPs ranging from 98 to 142. He takes Losartan 25 mg daily for hypertension.   Past Medical History:  Diagnosis Date  .  Alzheimer disease (HCC) 09/25/2019   Past Surgical History:  Procedure Laterality Date  . APPENDECTOMY    . BACK SURGERY    . CHOLECYSTECTOMY    . HERNIA REPAIR      Allergies  Allergen Reactions  . Demerol [Meperidine] Nausea And Vomiting    Outpatient Encounter Medications as of 05/26/2021  Medication Sig  . acetaminophen (TYLENOL) 325 MG tablet Take 650 mg by mouth every 6 (six) hours as needed.  Marland Kitchen apixaban (ELIQUIS) 2.5 MG TABS tablet Take 2.5 mg by mouth 2 (two) times daily.  Marland Kitchen atorvastatin (LIPITOR) 40 MG tablet Take 1 tablet (40 mg total) by mouth daily at 6 PM.  . bisacodyl (DULCOLAX) 10 MG suppository Place 10 mg rectally as needed for moderate constipation.  Marland Kitchen losartan (COZAAR) 25 MG tablet Take 25 mg by mouth daily.  . magnesium hydroxide (MILK OF MAGNESIA) 400 MG/5ML suspension If no BM in 3 days, give 30 cc Milk of Magnesium p.o. x 1 dose in 24 hours as needed (Do not use standing constipation orders for residents with renal failure CFR less than 30. Contact MD for orders) (Physician Order)  . memantine (NAMENDA) 10 MG tablet Take 10 mg by mouth 2 (two) times daily.   . Menthol, Topical Analgesic, (BIOFREEZE) 4 % GEL Apply 1 application topically in the morning, at noon, and at bedtime.  . Nutritional Supplements (ENSURE PO) Take 237 mLs by mouth daily.  Marland Kitchen senna-docusate (SENOKOT-S) 8.6-50 MG tablet Take 1 tablet by mouth at bedtime as needed for mild constipation.  . [DISCONTINUED] aspirin EC 81 MG  EC tablet Take 1 tablet (81 mg total) by mouth daily.  . [DISCONTINUED] Nutritional Supplements (NUTRITIONAL SUPPLEMENT PO) Take 1 each by mouth in the morning and at bedtime. Magic Cup   No facility-administered encounter medications on file as of 05/26/2021.    Review of Systems  Unable to obtain due to nonverbal    Immunization History  Administered Date(s) Administered  . Influenza-Unspecified 09/27/2019, 10/16/2020  . Moderna Sars-Covid-2 Vaccination 05/05/2020,  05/30/2020, 12/02/2020  . PPD Test 01/09/2020   Pertinent  Health Maintenance Due  Topic Date Due  . PNA vac Low Risk Adult (1 of 2 - PCV13) 05/26/2022 (Originally 01/04/2003)  . INFLUENZA VACCINE  07/27/2021   No flowsheet data found.   Vitals:   05/26/21 1123  BP: 139/60  Pulse: (!) 58  Resp: 20  Temp: 97.7 F (36.5 C)  Height: 6' (1.829 m)   Body mass index is 22.31 kg/m.  Physical Exam  GENERAL APPEARANCE: Well nourished. In no acute distress. Normal body habitus SKIN:  Skin is warm and dry.  MOUTH and THROAT: Lips are without lesions. Oral mucosa is moist and without lesions.  RESPIRATORY: Breathing is even & unlabored, BS CTAB CARDIAC: RRR, no murmur,no extra heart sounds, no edema GI: Abdomen soft, normal BS, no masses, no tenderness NEUROLOGICAL: There is no tremor. Nonverbal. PSYCHIATRIC:  Affect and behavior are appropriate  Labs reviewed: Recent Labs    03/10/21 0000 03/29/21 0000 05/13/21 0000  NA 143 142 145  K 4.4 4.1 5.0  CL 108 109* 106  CO2 28* 25* 26*  BUN 27* 31* 19  CREATININE 1.0 1.9* 1.2  CALCIUM 8.8 8.6* 9.6   Recent Labs    03/10/21 0000 03/29/21 0000 04/13/21 0000  AST 16 16 20   ALT 12 11 22   ALKPHOS  --  126* 155*  ALBUMIN 3.5 3.5 3.5   Recent Labs    07/18/20 0000 03/10/21 0000 03/29/21 0000 05/13/21 0000 05/22/21 0000  WBC 6.7   < > 15.2 11.4 8.0  NEUTROABS 4  --   --   --   --   HGB 11.9*   < > 12.6* 12.9* 12.0*  HCT 36*   < > 38* 38* 35*  PLT 167   < > 184 193 185   < > = values in this interval not displayed.   Lab Results  Component Value Date   TSH 2.36 05/05/2020   Lab Results  Component Value Date   HGBA1C 5.5 01/09/2020   Lab Results  Component Value Date   CHOL 105 03/10/2021   HDL 44 03/10/2021   LDLCALC 54 03/10/2021   TRIG 33 (A) 03/10/2021   CHOLHDL 5.0 01/09/2020     Assessment/Plan  1. Elevated d-dimer -  D-dimer trending up, 0.73, 0.81, 1.05 -  ASA 81 mg was discontinued, started on  Eliquis 2.5 mg BID  2. COVID-19 -  Completed isolation, resolved  3. Essential hypertension -  BPs stable, continue Losartan  4. History of CVA (cerebrovascular accident) -  Stable, continue Eliquis and Atorvastatin  5. Alzheimer disease (HCC) -  BIMS score 0 -  Continue Memantine  6. Need for shingles vaccine -  Sjingrix vaccine 0.5 ml IM X 1   Family/ staff Communication:   Discussed plan of care with charge nurse  Labs/tests ordered:  D-dimer and CBC in 1 week  Goals of care:   Long-term care   03/12/2021, DNP, MSN, FNP-BC [facility] and Adult Medicine [phone] (  Monday-Friday 8:00 a.m. - 5:00 p.m.) [phone] (after hours)

## 2021-06-02 LAB — CBC: RBC: 3.71 — AB (ref 3.87–5.11)

## 2021-06-02 LAB — CBC AND DIFFERENTIAL
HCT: 34 — AB (ref 41–53)
Hemoglobin: 11.6 — AB (ref 13.5–17.5)
Platelets: 190 (ref 150–399)
WBC: 7.4

## 2021-06-17 ENCOUNTER — Encounter: Payer: Self-pay | Admitting: Adult Health

## 2021-06-17 ENCOUNTER — Non-Acute Institutional Stay (SKILLED_NURSING_FACILITY): Payer: Medicare Other | Admitting: Adult Health

## 2021-06-17 DIAGNOSIS — Z7189 Other specified counseling: Secondary | ICD-10-CM | POA: Diagnosis not present

## 2021-06-17 DIAGNOSIS — G309 Alzheimer's disease, unspecified: Secondary | ICD-10-CM

## 2021-06-17 DIAGNOSIS — Z8673 Personal history of transient ischemic attack (TIA), and cerebral infarction without residual deficits: Secondary | ICD-10-CM | POA: Diagnosis not present

## 2021-06-17 DIAGNOSIS — R7989 Other specified abnormal findings of blood chemistry: Secondary | ICD-10-CM | POA: Diagnosis not present

## 2021-06-17 DIAGNOSIS — F0280 Dementia in other diseases classified elsewhere without behavioral disturbance: Secondary | ICD-10-CM

## 2021-06-17 DIAGNOSIS — I10 Essential (primary) hypertension: Secondary | ICD-10-CM | POA: Diagnosis not present

## 2021-06-17 NOTE — Progress Notes (Signed)
Location:  [facility] Room Number: 315 A Place of Service:  SNF (31) Provider:  Kenard Gower, DNP, FNP-BC  Patient Care Team: Pecola Lawless, MD as PCP - General (Internal Medicine) [name], [name], NP as Nurse Practitioner (Internal Medicine) Rehab, [facility] (Skilled Nursing Facility)  Extended Emergency Contact Information Primary Emergency Contact: Casimer, Russett Address: [address]          [place] [address] [place] of [place] Home Phone: [phone] Mobile Phone: [phone] Relation: Spouse Secondary Emergency Contact: Conroy, Goracke Mobile Phone: [phone] Relation: Son  Code Status:  DNR  Goals of care: Advanced Directive information Advanced Directives 06/17/2021  Does Patient Have a Medical Advance Directive? Yes  Type of Estate agent of [place];Out of facility DNR (pink MOST or yellow form)  Does patient want to make changes to medical advance directive? No - Patient declined  Copy of Healthcare Power of Attorney in Chart? Yes - validated most recent copy scanned in chart (See row information)  Pre-existing out of facility DNR order (yellow form or pink MOST form) -     Chief Complaint  Patient presents with   Follow-up    Care plan meeting    HPI:  Pt is a 85 y.o. male who had a care plan meeting today attended by resident, wife, social worker, MDS coordinator and NP. He remains to be DNR. He is a long-term care resident of [facility]. Discussed medications, vital signs and weights. D-dimer 0.75, elevated, and currently on Eliquis. No SOB has been reported. Wife visits everyday. BIMS score 0/15, ranging in severe cognitive impairment. The meeting lasted for 20 minutes.  Past Medical History:  Diagnosis Date   Alzheimer disease (HCC) 09/25/2019   Past Surgical History:  Procedure Laterality Date   APPENDECTOMY     BACK SURGERY      CHOLECYSTECTOMY     HERNIA REPAIR      Allergies  Allergen Reactions   Demerol [Meperidine] Nausea And Vomiting    Outpatient Encounter Medications as of 06/17/2021  Medication Sig   acetaminophen (TYLENOL) 325 MG tablet Take 650 mg by mouth every 6 (six) hours as needed.   apixaban (ELIQUIS) 2.5 MG TABS tablet Take 2.5 mg by mouth 2 (two) times daily.   atorvastatin (LIPITOR) 40 MG tablet Take 1 tablet (40 mg total) by mouth daily at 6 PM.   losartan (COZAAR) 25 MG tablet Take 25 mg by mouth daily.   magnesium hydroxide (MILK OF MAGNESIA) 400 MG/5ML suspension If no BM in 3 days, give 30 cc Milk of Magnesium p.o. x 1 dose in 24 hours as needed (Do not use standing constipation orders for residents with renal failure CFR less than 30. Contact MD for orders) (Physician Order)   memantine (NAMENDA) 10 MG tablet Take 10 mg by mouth 2 (two) times daily.    Menthol, Topical Analgesic, (BIOFREEZE) 4 % GEL Apply 1 application topically in the morning, at noon, and at bedtime.   Nutritional Supplements (ENSURE PO) Take 237 mLs by mouth daily.   senna-docusate (SENOKOT-S) 8.6-50 MG tablet Take 1 tablet by mouth at bedtime as needed for mild constipation.   [DISCONTINUED] bisacodyl (DULCOLAX) 10 MG suppository Place 10 mg rectally as needed for moderate constipation.   No facility-administered encounter medications on file as of 06/17/2021.    Review of Systems  Unable to obtain due to dementia   Immunization History  Administered Date(s) Administered  Influenza-Unspecified 09/27/2019, 10/16/2020   Moderna Sars-Covid-2 Vaccination 05/05/2020, 05/30/2020, 12/02/2020   PPD Test 01/09/2020   Pertinent  Health Maintenance Due  Topic Date Due   PNA vac Low Risk Adult (1 of 2 - PCV13) 05/26/2022 (Originally 01/04/2003)   INFLUENZA VACCINE  07/27/2021   No flowsheet data found.   Vitals:   06/17/21 1030  BP: (!) 138/43  Pulse: 60  Resp: 18  Temp: 97.8 F (36.6 C)  Weight: 164 lb 9.6 oz  (74.7 kg)  Height: 6' (1.829 m)   Body mass index is 22.32 kg/m.  Physical Exam  GENERAL APPEARANCE: Well nourished. In no acute distress. Normal body habitus SKIN:  Skin is warm and dry.  MOUTH and THROAT: Lips are without lesions. Oral mucosa is moist and without lesions.  RESPIRATORY: Breathing is even & unlabored, BS CTAB CARDIAC: RRR, no murmur,no extra heart sounds, no edema GI: Abdomen soft, normal BS, no masses, no tenderness NEUROLOGICAL: There is no tremor. Nonverbal. PSYCHIATRIC:  Affect and behavior are appropriate  Labs reviewed: Recent Labs    03/10/21 0000 03/29/21 0000 05/13/21 0000  NA 143 142 145  K 4.4 4.1 5.0  CL 108 109* 106  CO2 28* 25* 26*  BUN 27* 31* 19  CREATININE 1.0 1.9* 1.2  CALCIUM 8.8 8.6* 9.6   Recent Labs    03/10/21 0000 03/29/21 0000 04/13/21 0000  AST 16 16 20   ALT 12 11 22   ALKPHOS  --  126* 155*  ALBUMIN 3.5 3.5 3.5   Recent Labs    07/18/20 0000 03/10/21 0000 03/29/21 0000 05/13/21 0000 05/22/21 0000  WBC 6.7   < > 15.2 11.4 8.0  NEUTROABS 4  --   --   --   --   HGB 11.9*   < > 12.6* 12.9* 12.0*  HCT 36*   < > 38* 38* 35*  PLT 167   < > 184 193 185   < > = values in this interval not displayed.   Lab Results  Component Value Date   TSH 2.36 05/05/2020   Lab Results  Component Value Date   HGBA1C 5.5 01/09/2020   Lab Results  Component Value Date   CHOL 105 03/10/2021   HDL 44 03/10/2021   LDLCALC 54 03/10/2021   TRIG 33 (A) 03/10/2021   CHOLHDL 5.0 01/09/2020    Significant Diagnostic Results in last 30 days:  No results found.  Assessment/Plan  1. Advance Y planning -  remains to be DNR -   discussed medications, vital signs and weights  2. Elevated d-dimer -  0.75, 06/02/21 -  continue Eliquis  3. Essential hypertension -  stable, continue losartan  4. History of CVA (cerebrovascular accident) -Stable, continue atorvastatin  5. Alzheimer disease (HCC) -   BIMS score 0/15, ranging in  severe cognitive impairment -   continue memantine    Family/ staff Communication:   Discussed plan of care with wife and IDT  Labs/tests ordered: None  Goals of care:   Long-term care   08/02/21, DNP, MSN, FNP-BC [facility] and [facility] [phone] (Monday-Friday 8:00 a.m. - 5:00 p.m.) [phone] (after hours)

## 2021-07-09 ENCOUNTER — Non-Acute Institutional Stay (SKILLED_NURSING_FACILITY): Payer: Medicare Other | Admitting: Adult Health

## 2021-07-09 ENCOUNTER — Encounter: Payer: Self-pay | Admitting: Adult Health

## 2021-07-09 DIAGNOSIS — R7989 Other specified abnormal findings of blood chemistry: Secondary | ICD-10-CM | POA: Diagnosis not present

## 2021-07-09 DIAGNOSIS — I10 Essential (primary) hypertension: Secondary | ICD-10-CM

## 2021-07-09 DIAGNOSIS — G309 Alzheimer's disease, unspecified: Secondary | ICD-10-CM | POA: Diagnosis not present

## 2021-07-09 DIAGNOSIS — Z8673 Personal history of transient ischemic attack (TIA), and cerebral infarction without residual deficits: Secondary | ICD-10-CM

## 2021-07-09 DIAGNOSIS — F0280 Dementia in other diseases classified elsewhere without behavioral disturbance: Secondary | ICD-10-CM

## 2021-07-09 NOTE — Progress Notes (Signed)
Location:  [facility] Room Number: 315 A Place of Service:  SNF (31) Provider:  Kenard Gower, DNP, FNP-BC  Patient Care Team: Pecola Lawless, MD as PCP - General (Internal Medicine) [name], [name], NP as Nurse Practitioner (Internal Medicine) Rehab, [facility]Sk[facility] Nursing Facility)  Extended Emergency Contact Information Primary Emergency Contact: Corliss, Lamartina Address: [address]          [place] [address] [place] of [place] Home Phone: [phone] Mobile Phone: [phone] Relation: Spouse Secondary Emergency Contact: Dayvion, Sans Mobile Phone: [phone] Relation: Son  Code Status:  DNR  Goals of care: Advanced Directive information Advanced Directives 07/09/2021  Does Patient Have a Medical Advance Directive? Yes  Type of Estate agent of [place];Out of facility DNR (pink MOST or yellow form)  Does patient want to make changes to medical advance directive? No - Patient declined  Copy of Healthcare Power of Attorney in Chart? Yes - validated most recent copy scanned in chart (See row information)  Pre-existing out of facility DNR order (yellow form or pink MOST form) Yellow form placed in chart (order not valid for inpatient use)     Chief Complaint  Patient presents with   Medical Management of Chronic Issues    Routine follow up visit.    HPI:  Pt is a 85 y.o. male seen today for medical management of chronic diseases. He is a long-term care resident of [facility]. He has a PMH of advanced dementia, CVA, DVT and COVID-19 infection. He is currently taking Eliquis for elevated d-dimer. Serial d-dimer declining, 1.05, 0.52, 0.75, 0.61. No noted SOB nor bruising. He takes EC 81 mg daily and atorvastatin 40 mg daily for history of CVA. BIMS score 0/15, ranging in severe cognitive impairment. He is verbal but confused and disoriented.  He takes memantine  10 mg twice a day for Alzheimer's disease.   Past Medical History:  Diagnosis Date   Alzheimer disease (HCC) 09/25/2019   Past Surgical History:  Procedure Laterality Date   APPENDECTOMY     BACK SURGERY     CHOLECYSTECTOMY     HERNIA REPAIR      Allergies  Allergen Reactions   Demerol [Meperidine] Nausea And Vomiting    Outpatient Encounter Medications as of 07/09/2021  Medication Sig   acetaminophen (TYLENOL) 325 MG tablet Take 650 mg by mouth every 6 (six) hours as needed.   aspirin EC 81 MG tablet Take 81 mg by mouth daily. Swallow whole.   atorvastatin (LIPITOR) 40 MG tablet Take 1 tablet (40 mg total) by mouth daily at 6 PM.   losartan (COZAAR) 25 MG tablet Take 25 mg by mouth daily.   magnesium hydroxide (MILK OF MAGNESIA) 400 MG/5ML suspension If no BM in 3 days, give 30 cc Milk of Magnesium p.o. x 1 dose in 24 hours as needed (Do not use standing constipation orders for residents with renal failure CFR less than 30. Contact MD for orders) (Physician Order)   memantine (NAMENDA) 10 MG tablet Take 10 mg by mouth 2 (two) times daily.    Menthol, Topical Analgesic, (BIOFREEZE) 4 % GEL Apply 1 application topically in the morning, at noon, and at bedtime.   Nutritional Supplements (ENSURE PO) Take 237 mLs by mouth daily.   senna-docusate (SENOKOT-S) 8.6-50 MG tablet Take 1 tablet by mouth at bedtime as needed for mild constipation.   apixaban (ELIQUIS) 2.5 MG TABS tablet Take 2.5 mg by  mouth 2 (two) times daily.   No facility-administered encounter medications on file as of 07/09/2021.    Review of Systems unable to obtain due to dementia   Immunization History  Administered Date(s) Administered   Influenza-Unspecified 09/27/2019, 10/16/2020   Moderna Sars-Covid-2 Vaccination 05/05/2020, 05/30/2020, 12/02/2020   PPD Test 01/09/2020   Pertinent  Health Maintenance Due  Topic Date Due   PNA vac Low Risk Adult (1 of 2 - PCV13) 05/26/2022 (Originally 01/04/2003)   INFLUENZA  VACCINE  07/27/2021   No flowsheet data found.   Vitals:   07/09/21 1405  BP: (!) 104/54  Pulse: (!) 56  Resp: 20  Temp: (!) 97 F (36.1 C)  Height: 6' (1.829 m)   Body mass index is 22.32 kg/m.  Physical Exam  GENERAL APPEARANCE: Well nourished. In no acute distress. Normal body habitus SKIN:  Skin is warm and dry.  MOUTH and THROAT: Lips are without lesions. Oral mucosa is moist and without lesions.  RESPIRATORY: Breathing is even & unlabored, BS CTAB CARDIAC: RRR, no murmur,no extra heart sounds, no edema GI: Abdomen soft, normal BS, no masses, no tenderness NEUROLOGICAL: There is no tremor. Speech is clear. Disoriented X3. PSYCHIATRIC:  Affect and behavior are appropriate  Labs reviewed: Recent Labs    03/10/21 0000 03/29/21 0000 05/13/21 0000  NA 143 142 145  K 4.4 4.1 5.0  CL 108 109* 106  CO2 28* 25* 26*  BUN 27* 31* 19  CREATININE 1.0 1.9* 1.2  CALCIUM 8.8 8.6* 9.6   Recent Labs    03/10/21 0000 03/29/21 0000 04/13/21 0000  AST 16 16 20   ALT 12 11 22   ALKPHOS  --  126* 155*  ALBUMIN 3.5 3.5 3.5   Recent Labs    07/18/20 0000 03/10/21 0000 05/13/21 0000 05/22/21 0000 06/02/21 0000  WBC 6.7   < > 11.4 8.0 7.4  NEUTROABS 4  --   --   --   --   HGB 11.9*   < > 12.9* 12.0* 11.6*  HCT 36*   < > 38* 35* 34*  PLT 167   < > 193 185 190   < > = values in this interval not displayed.   Lab Results  Component Value Date   TSH 2.36 05/05/2020   Lab Results  Component Value Date   HGBA1C 5.5 01/09/2020   Lab Results  Component Value Date   CHOL 105 03/10/2021   HDL 44 03/10/2021   LDLCALC 54 03/10/2021   TRIG 33 (A) 03/10/2021   CHOLHDL 5.0 01/09/2020    Significant Diagnostic Results in last 30 days:  No results found.  Assessment/Plan  1. Elevated d-dimer -  d-dimer declining, no SOB -  will discontinue Eliquis   2. History of CVA (cerebrovascular accident) -Stable, will continue ASA EC 81 mg daily and atorvastatin 40 mg  daily  3. Essential hypertension -  BPs stable, continue losartan 25 mg daily  4. Alzheimer disease (HCC) -  BIMS score 0/15, ranging in severe cognitive impairment -   Continue memantine and supportive care    Family/ staff Communication:   Discussed plan of care with charge nurse.  Labs/tests ordered: None  Goals of care:   Long-term care   01/11/2020, DNP, MSN, FNP-BC [facility] and Adult Medicine [phone] (Monday-Friday 8:00 a.m. - 5:00 p.m.) [phone] (after hours)

## 2021-07-28 ENCOUNTER — Encounter: Payer: Self-pay | Admitting: Internal Medicine

## 2021-07-28 ENCOUNTER — Non-Acute Institutional Stay (SKILLED_NURSING_FACILITY): Payer: Medicare Other | Admitting: Internal Medicine

## 2021-07-28 DIAGNOSIS — D649 Anemia, unspecified: Secondary | ICD-10-CM | POA: Diagnosis not present

## 2021-07-28 DIAGNOSIS — N1831 Chronic kidney disease, stage 3a: Secondary | ICD-10-CM | POA: Diagnosis not present

## 2021-07-28 DIAGNOSIS — G309 Alzheimer's disease, unspecified: Secondary | ICD-10-CM

## 2021-07-28 DIAGNOSIS — F0280 Dementia in other diseases classified elsewhere without behavioral disturbance: Secondary | ICD-10-CM

## 2021-07-28 DIAGNOSIS — Z86718 Personal history of other venous thrombosis and embolism: Secondary | ICD-10-CM

## 2021-07-28 DIAGNOSIS — N189 Chronic kidney disease, unspecified: Secondary | ICD-10-CM | POA: Insufficient documentation

## 2021-07-28 NOTE — Patient Instructions (Signed)
See assessment and plan under each diagnosis in the problem list and acutely for this visit 

## 2021-07-28 NOTE — Assessment & Plan Note (Addendum)
Vascular demential stable but associated with behavioral issues ( disrobing), inability to follow commands, & variable meal intake

## 2021-07-28 NOTE — Assessment & Plan Note (Addendum)
With recent COVID positivity D-dimer was 1.05,prompting reinitiation of low-dose Eliquis with monitoring serially of the D-dimer. Eliquis D/Ced when D dimer dropped to 0.61.

## 2021-07-28 NOTE — Assessment & Plan Note (Addendum)
05/13/2021 H/H 12/35; essentially stable w/o bleeding dyscrasias despite recent Eliquis prophylaxis for elevated D dimer in context of essentially asymptomatic Covid positivity

## 2021-07-28 NOTE — Progress Notes (Signed)
   NURSING HOME LOCATION:  [facility] ROOM NUMBER:  315  CODE STATUS:  DNR  PCP:  Douglass Rivers MD  This is a nursing facility follow up visit of chronic medical diagnoses & to document compliance with Regulation 483.30 (c) in The Long Term Care Survey Manual Phase 2 which mandates caregiver visit ( visits can alternate among physician, PA or NP as per statutes) within 10 days of 30 days / 60 days/ 90 days post admission to SNF date    Interim medical record and care since last SNF visit was updated with review of diagnostic studies and change in clinical status since last visit were documented.  HPI: He is a permanent resident of this facility with diagnoses of history of stroke complicated by dysphagia, Alzheimer's disease, history of DVT, and frequent falls. At the time of admission to this facility had the diagnosis of recent DVT and was on anticoagulant. Recently he has been COVID-positive and D-dimer was elevated prompting reinitiation of low-dose Eliquis.  Review of systems: Severe dementia precluded completing review of systems. Staff reports that meal intake is variable.  He did not eat much of 2 meals within the last 24-48 hours.  He resists following commands.  He usually will repeatedly disrobe throughout the day.  Physical exam:  Pertinent or positive findings: Pattern alopecia is present.  He stares blankly ahead but will look at the examiner when addressed.  Mouth is agape.  He has somewhat of a suspicious countenance when he is queried.  First and second heart sounds are accentuated.  There is a grade 1/2 systolic murmur at the left base.  Breath sounds are decreased.  Pedal pulses are surprisingly good except for the right dorsalis pedis pulse which is reduced compared to the others.  The left lower extremity is flexed at the knee and he resists passive extension.  He has minor osteoarthritic change of the hands.  Clinically Denna Haggard' sign is  negative.  General appearance: Adequately nourished; no acute distress, increased work of breathing is present.   Lymphatic: No lymphadenopathy about the head, neck, axilla. Eyes: No conjunctival inflammation or lid edema is present. There is no scleral icterus. Ears:  External ear exam shows no significant lesions or deformities.   Nose:  External nasal examination shows no deformity or inflammation. Nasal mucosa are pink and moist without lesions, exudates Neck:  No thyromegaly, masses, tenderness noted.    Heart:  No gallop, click, rub .  Lungs:  without wheezes, rhonchi, rales, rubs. Abdomen: Bowel sounds are normal. Abdomen is soft and nontender with no organomegaly, hernias, masses. GU: Deferred  Extremities:  No cyanosis, clubbing, edema  Neurologic exam :Balance, Rhomberg, finger to nose testing could not be completed due to clinical state Skin: Warm & dry w/o tenting. No significant lesions or rash.  See summary under each active problem in the Problem List with associated updated therapeutic plan

## 2021-07-28 NOTE — Assessment & Plan Note (Addendum)
05/13/2021 creat 1.2/ GFR 53.29,CKD Stage 3a Medication List reviewed. If CKD progresses ARB will be weaned or discontinued.

## 2021-08-03 LAB — LIPID PANEL
Cholesterol: 98 (ref 0–200)
HDL: 41 (ref 35–70)
LDL Cholesterol: 47
LDl/HDL Ratio: 2.4
Triglycerides: 51 (ref 40–160)

## 2021-08-18 ENCOUNTER — Encounter: Payer: Self-pay | Admitting: Adult Health

## 2021-08-18 ENCOUNTER — Non-Acute Institutional Stay (SKILLED_NURSING_FACILITY): Payer: Medicare Other | Admitting: Adult Health

## 2021-08-18 DIAGNOSIS — G309 Alzheimer's disease, unspecified: Secondary | ICD-10-CM

## 2021-08-18 DIAGNOSIS — F39 Unspecified mood [affective] disorder: Secondary | ICD-10-CM

## 2021-08-18 DIAGNOSIS — F0280 Dementia in other diseases classified elsewhere without behavioral disturbance: Secondary | ICD-10-CM

## 2021-08-18 NOTE — Progress Notes (Signed)
Location:  [facility] Room Number: 315 A Place of Service:  SNF (31) Provider:  Kenard Gower, DNP, FNP-BC  Patient Care Team: Pecola Lawless, MD as PCP - General (Internal Medicine) [name], [name], NP as Nurse Practitioner (Internal Medicine) Rehab, [facility] (Skilled Nursing Facility)  Extended Emergency Contact Information Primary Emergency Contact: Itzel, Lowrimore Address: [address]          [place] [address] [place] Home Phone: [phone] Mobile Phone: [phone] Relation: Spouse Secondary Emergency Contact: Neilson, O[place] Mobile Phone: [phone] Relation: Son  Code Status:  DNR  Goals of care: Advanced Directive information Advanced Directives 07/09/2021  Does Patient Have a Medical Advance Directive? Yes  Type of Estate agent of [place];Out of facility DNR (pink MOST or yellow form)  Does patient want to make changes to medical advance directive? No - Patient declined  Copy of Healthcare Power of Attorney in Chart? Yes - validated most recent copy scanned in chart (See row information)  Pre-existing out of facility DNR order (yellow form or pink MOST form) Yellow form placed in chart (order not valid for inpatient use)     Chief Complaint  Patient presents with   Acute Visit    Agitated during ADL care    HPI:  Pt is a 85 y.o. male seen today for an acute visit regarding agitation. He is a long-term care resident of [facility]. Staff reported that resident grabs on their arms during ADL care. Sometimes, he grabs tightly on the side of the bed and won't turn during ADL care. He takes Memantine 10 mg BID for dementia. He is mostly nonverbal and has severe cognitive impairment.  Past Medical History:  Diagnosis Date   Alzheimer disease (HCC) 09/25/2019   Past Surgical History:  Procedure Laterality Date   APPENDECTOMY     BACK SURGERY      CHOLECYSTECTOMY     HERNIA REPAIR      Allergies  Allergen Reactions   Demerol [Meperidine] Nausea And Vomiting    Outpatient Encounter Medications as of 08/18/2021  Medication Sig   acetaminophen (TYLENOL) 325 MG tablet Take 650 mg by mouth every 6 (six) hours as needed.   apixaban (ELIQUIS) 2.5 MG TABS tablet Take 2.5 mg by mouth 2 (two) times daily.   aspirin EC 81 MG tablet Take 81 mg by mouth daily. Swallow whole.   atorvastatin (LIPITOR) 40 MG tablet Take 1 tablet (40 mg total) by mouth daily at 6 PM.   losartan (COZAAR) 25 MG tablet Take 25 mg by mouth daily.   magnesium hydroxide (MILK OF MAGNESIA) 400 MG/5ML suspension If no BM in 3 days, give 30 cc Milk of Magnesium p.o. x 1 dose in 24 hours as needed (Do not use standing constipation orders for residents with renal failure CFR less than 30. Contact MD for orders) (Physician Order)   memantine (NAMENDA) 10 MG tablet Take 10 mg by mouth 2 (two) times daily.    Menthol, Topical Analgesic, (BIOFREEZE) 4 % GEL Apply 1 application topically in the morning, at noon, and at bedtime.   Nutritional Supplements (ENSURE PO) Take 237 mLs by mouth daily.   senna-docusate (SENOKOT-S) 8.6-50 MG tablet Take 1 tablet by mouth at bedtime as needed for mild constipation.   No facility-administered encounter medications on file as of 08/18/2021.    Review of Systems  Unable to obtain due to dementia.   Immunization History  Administered Date(s) Administered   Influenza-Unspecified 09/27/2019, 10/16/2020   Moderna Sars-Covid-2 Vaccination 05/05/2020, 05/30/2020, 12/02/2020   PPD Test 01/09/2020   Pertinent  Health Maintenance Due  Topic Date Due   INFLUENZA VACCINE  07/27/2021   PNA vac Low Risk Adult (1 of 2 - PCV13) 05/26/2022 (Originally 01/04/2003)   No flowsheet data found.   Vitals:   08/18/21 1300  BP: 140/73  Pulse: 79  Resp: 20  Temp: (!) 97.5 F (36.4 C)  Weight: 171 lb (77.6 kg)  Height: 6' (1.829 m)   Body mass  index is 23.19 kg/m.  Physical Exam  GENERAL APPEARANCE: Well nourished. In no acute distress. Normal body habitus SKIN:  Skin is warm and dry.  MOUTH and THROAT: Lips are without lesions. Oral mucosa is moist and without lesions. RESPIRATORY: Breathing is even & unlabored, BS CTAB CARDIAC: RRR, no murmur,no extra heart sounds, no edema GI: Abdomen soft, normal BS, no masses, no tenderness NEUROLOGICAL: There is no tremor. Nonverbal PSYCHIATRIC:  Affect and behavior are appropriate during the visit.  Labs reviewed: Recent Labs    03/10/21 0000 03/29/21 0000 05/13/21 0000  NA 143 142 145  K 4.4 4.1 5.0  CL 108 109* 106  CO2 28* 25* 26*  BUN 27* 31* 19  CREATININE 1.0 1.9* 1.2  CALCIUM 8.8 8.6* 9.6   Recent Labs    03/10/21 0000 03/29/21 0000 04/13/21 0000  AST 16 16 20   ALT 12 11 22   ALKPHOS  --  126* 155*  ALBUMIN 3.5 3.5 3.5   Recent Labs    05/13/21 0000 05/22/21 0000 06/02/21 0000  WBC 11.4 8.0 7.4  HGB 12.9* 12.0* 11.6*  HCT 38* 35* 34*  PLT 193 185 190   Lab Results  Component Value Date   TSH 2.36 05/05/2020   Lab Results  Component Value Date   HGBA1C 5.5 01/09/2020   Lab Results  Component Value Date   CHOL 105 03/10/2021   HDL 44 03/10/2021   LDLCALC 54 03/10/2021   TRIG 33 (A) 03/10/2021   CHOLHDL 5.0 01/09/2020    Significant Diagnostic Results in last 30 days:  No results found.  Assessment/Plan  1. Mood disorder (HCC) -  will start on Depakote sprinkles 125 mg give 2 capsules = 250 mg Q AM and HS -  monitor behavior -  psych consult  2. Alzheimer disease (HCC) -  has severe cognitive impairment -  continue Memantine 10 mg BID     Family/ staff Communication:   Discussed plan of care with resident and charge nurse.  Labs/tests ordered:  None  Goals of care:   Long-term care   03/12/2021, DNP, MSN, FNP-BC [facility] and Adult Medicine [phone] (Monday-Friday 8:00 a.m. - 5:00  p.m.) [phone] (after hours)

## 2021-09-09 ENCOUNTER — Encounter: Payer: Self-pay | Admitting: Adult Health

## 2021-09-09 ENCOUNTER — Non-Acute Institutional Stay (SKILLED_NURSING_FACILITY): Payer: Medicare Other | Admitting: Adult Health

## 2021-09-09 DIAGNOSIS — I10 Essential (primary) hypertension: Secondary | ICD-10-CM | POA: Diagnosis not present

## 2021-09-09 DIAGNOSIS — Z8673 Personal history of transient ischemic attack (TIA), and cerebral infarction without residual deficits: Secondary | ICD-10-CM

## 2021-09-09 DIAGNOSIS — Z7189 Other specified counseling: Secondary | ICD-10-CM

## 2021-09-09 DIAGNOSIS — G309 Alzheimer's disease, unspecified: Secondary | ICD-10-CM

## 2021-09-09 DIAGNOSIS — F39 Unspecified mood [affective] disorder: Secondary | ICD-10-CM

## 2021-09-09 DIAGNOSIS — F0280 Dementia in other diseases classified elsewhere without behavioral disturbance: Secondary | ICD-10-CM

## 2021-09-09 NOTE — Progress Notes (Signed)
Location:  [facility] Room Number: 315-A Place of Service:  SNF (31) Provider:  Kenard Gower, DNP, FNP-BC  Patient Care Team: Pecola Lawless, MD as PCP - General (Internal Medicine) [name], [name], NP as Nurse Practitioner (Internal Medicine) Rehab, [facility] (Sk[facility] Nursing Facility)  Extended Emergency Contact Information Primary Emergency Contact: Rishon, Thilges Address: [address]          [place] [address] [place] of [place] Home Phone: [phone] Mobile Phone: [phone] Relation: Spouse Secondary Emergency Contact: Jahkeem, Kurka Mobile Phone: [phone] Relation: Son  Code Status: DNR   Goals of care: Advanced Directive information Advanced Directives 09/09/2021  Does Patient Have a Medical Advance Directive? Yes  Type of Advance Directive Out of facility DNR (pink MOST or yellow form)  Does patient want to make changes to medical advance directive? No - Patient declined  Copy of Healthcare Power of Attorney in Chart? -  Pre-existing out of facility DNR order (yellow form or pink MOST form) Yellow form placed in chart (order not valid for inpatient use)     Chief Complaint  Patient presents with   Acute Visit    Care plan meeting    HPI:  Pt is a 85 y.o. male who had a care plan meeting today attended by wife, Delores, NP, MDS coordinator and Child psychotherapist. He remains to be DNR. Discussed medications, vital signs and weights. Kyle [name] was reported to have episodes of resisting care/gets agitated during ADL cares. He was recently started on Depakote for mood disorder. He does not participate in facility activities. Wife visits everyday.  The meeting lasted for 20 minutes.   Past Medical History:  Diagnosis Date   Alzheimer disease (HCC) 09/25/2019   Past Surgical History:  Procedure Laterality Date   APPENDECTOMY     BACK SURGERY     CHOLECYSTECTOMY     HERNIA REPAIR      Allergies   Allergen Reactions   Demerol [Meperidine] Nausea And Vomiting    Outpatient Encounter Medications as of 09/09/2021  Medication Sig   acetaminophen (TYLENOL) 325 MG tablet Take 650 mg by mouth every 6 (six) hours as needed.   aspirin EC 81 MG tablet Take 81 mg by mouth daily. Swallow whole.   atorvastatin (LIPITOR) 40 MG tablet Take 1 tablet (40 mg total) by mouth daily at 6 PM.   bisacodyl (DULCOLAX) 10 MG suppository as needed. If not relieved by MOM, give 10 mg Bisacodyl suppositiory rectally X 1 dose in 24 hours as needed   divalproex (DEPAKOTE) 125 MG DR tablet Take 250 mg by mouth 2 (two) times daily. mood disorder   losartan (COZAAR) 25 MG tablet Take 25 mg by mouth daily.   magnesium hydroxide (MILK OF MAGNESIA) 400 MG/5ML suspension If no BM in 3 days, give 30 cc Milk of Magnesium p.o. x 1 dose in 24 hours as needed (Do not use standing constipation orders for residents with renal failure CFR less than 30. Contact MD for orders) (Physician Order)   memantine (NAMENDA) 10 MG tablet Take 10 mg by mouth 2 (two) times daily.    Menthol, Topical Analgesic, (BIOFREEZE) 4 % GEL Apply 1 application topically in the morning, at noon, and at bedtime.   NON FORMULARY DIET: REGULAR, THIN LIQUIDS. STAFF TO ASSIST WITH ALL ORAL INTAKE   Nutritional Supplements (ENSURE PO) Take 237 mLs by mouth daily.   senna-docusate (SENOKOT-S) 8.6-50 MG tablet Take 1 tablet by mouth at  bedtime as needed for mild constipation.   [DISCONTINUED] apixaban (ELIQUIS) 2.5 MG TABS tablet Take 2.5 mg by mouth 2 (two) times daily.   No facility-administered encounter medications on file as of 09/09/2021.    Review of Systems  Unable to obtain due to dementia.   Immunization History  Administered Date(s) Administered   Influenza-Unspecified 09/27/2019, 10/16/2020   Moderna Sars-Covid-2 Vaccination 05/05/2020, 05/30/2020, 12/02/2020   PPD Test 01/09/2020   Pertinent  Health Maintenance Due  Topic Date Due    INFLUENZA VACCINE  07/27/2021   No flowsheet data found.   Vitals:   09/09/21 1259  BP: 118/69  Pulse: (!) 55  Resp: 20  Temp: (!) 97.5 F (36.4 C)  Weight: 168 lb 6.4 oz (76.4 kg)  Height: 6' (1.829 m)   Body mass index is 22.84 kg/m.  Physical Exam  GENERAL APPEARANCE: Well nourished. In no acute distress. Normal body habitus MOUTH and THROAT: Lips are without lesions. Oral mucosa is moist and without lesions.  RESPIRATORY: Breathing is even & unlabored, BS CTAB CARDIAC: RRR, no murmur,no extra heart sounds, no edema GI: Abdomen soft, normal BS, no masses, no tenderness NEUROLOGICAL: There is no tremor. Left hemiparesis. PSYCHIATRIC:  Affect and behavior are appropriate  Labs reviewed: Recent Labs    03/10/21 0000 03/29/21 0000 05/13/21 0000  NA 143 142 145  K 4.4 4.1 5.0  CL 108 109* 106  CO2 28* 25* 26*  BUN 27* 31* 19  CREATININE 1.0 1.9* 1.2  CALCIUM 8.8 8.6* 9.6   Recent Labs    03/10/21 0000 03/29/21 0000 04/13/21 0000  AST 16 16 20   ALT 12 11 22   ALKPHOS  --  126* 155*  ALBUMIN 3.5 3.5 3.5   Recent Labs    05/13/21 0000 05/22/21 0000 06/02/21 0000  WBC 11.4 8.0 7.4  HGB 12.9* 12.0* 11.6*  HCT 38* 35* 34*  PLT 193 185 190   Lab Results  Component Value Date   TSH 2.36 05/05/2020   Lab Results  Component Value Date   HGBA1C 5.5 01/09/2020   Lab Results  Component Value Date   CHOL 98 08/03/2021   HDL 41 08/03/2021   LDLCALC 47 08/03/2021   TRIG 51 08/03/2021   CHOLHDL 5.0 01/09/2020    Significant Diagnostic Results in last 30 days:  No results found.  Assessment/Plan  1. Advance care planning -   Remains to be DNR -    Discussed vital signs, weights and medications  2. Essential hypertension  BPs stable, continue losartan  3. History of CVA (cerebrovascular accident) -    Stable, continue aspirin  4. Mood disorder (HCC) -   Has episodes of resistance to care/agitation -   Continue Depakote  5. Alzheimer disease  (HCC) -Continue memantine and supportive care     Family/ staff Communication:   Discussed plan of care with r wife and IDT.  Labs/tests ordered: None  Goals of care:   Long-term care   10/03/2021, DNP, MSN, FNP-BC [facility] [phone] (Monday-Friday 8:00 a.m. - 5:00 p.m.) [phone] (after hours)

## 2021-10-08 ENCOUNTER — Encounter: Payer: Self-pay | Admitting: Adult Health

## 2021-10-08 ENCOUNTER — Non-Acute Institutional Stay (SKILLED_NURSING_FACILITY): Payer: Medicare Other | Admitting: Adult Health

## 2021-10-08 DIAGNOSIS — G309 Alzheimer's disease, unspecified: Secondary | ICD-10-CM

## 2021-10-08 DIAGNOSIS — F0280 Dementia in other diseases classified elsewhere without behavioral disturbance: Secondary | ICD-10-CM

## 2021-10-08 DIAGNOSIS — F39 Unspecified mood [affective] disorder: Secondary | ICD-10-CM | POA: Diagnosis not present

## 2021-10-08 DIAGNOSIS — I10 Essential (primary) hypertension: Secondary | ICD-10-CM | POA: Diagnosis not present

## 2021-10-08 DIAGNOSIS — Z8673 Personal history of transient ischemic attack (TIA), and cerebral infarction without residual deficits: Secondary | ICD-10-CM

## 2021-10-08 NOTE — Progress Notes (Signed)
Location:  [facility] Room Number: 315 A Place of Service:  SNF (31) Provider:  Kenard Gower, DNP, FNP-BC  Patient Care Team: Pecola Lawless, MD as PCP - General (Internal Medicine) [name], [name], NP as Nurse Practitioner (Internal Medicine) Rehab, [facility] (Skilled Nursing Facility)  Extended Emergency Contact Information Primary Emergency Contact: Randal, Goens Address: [address]          [place] [address] [place] of [place] Home Phone: [phone] Mobile Phone: [phone] Relation: Spouse Secondary Emergency Contact: Tudor, Chandley Mobile Phone: [phone] Relation: Son  Code Status:  DNR  Goals of care: Advanced Directive information Advanced Directives 10/08/2021  Does Patient Have a Medical Advance Directive? Yes  Type of Advance Directive Out of facility DNR (pink MOST or yellow form)  Does patient want to make changes to medical advance directive? No - Patient declined  Copy of Healthcare Power of Attorney in Chart? -  Pre-existing out of facility DNR order (yellow form or pink MOST form) Yellow form placed in chart (order not valid for inpatient use)     Chief Complaint  Patient presents with   Medical Management of Chronic Issues    Routine follow up visit    HPI:  Pt is a 85 y.o. male seen today for medical management of chronic diseases. He is a long-term care resident of [facility].  He has a PMH of dementia, CVA, DVT and COVID-19 infection. SBPs ranging from 109-146, with outlier 150.  He takes losartan 25 mg daily for hypertension.  He has history of CVA and takes aspirin 81 mg daily and atorvastatin 40 mg daily. He needs extensive assistance with ADLs.   Past Medical History:  Diagnosis Date   Alzheimer disease (HCC) 09/25/2019   Past Surgical History:  Procedure Laterality Date   APPENDECTOMY     BACK SURGERY     CHOLECYSTECTOMY     HERNIA REPAIR       Allergies  Allergen Reactions   Demerol [Meperidine] Nausea And Vomiting    Outpatient Encounter Medications as of 10/08/2021  Medication Sig   acetaminophen (TYLENOL) 325 MG tablet Take 650 mg by mouth every 6 (six) hours as needed.   aspirin EC 81 MG tablet Take 81 mg by mouth daily. Swallow whole.   atorvastatin (LIPITOR) 40 MG tablet Take 1 tablet (40 mg total) by mouth daily at 6 PM.   divalproex (DEPAKOTE) 125 MG DR tablet Take 250 mg by mouth 2 (two) times daily. mood disorder   losartan (COZAAR) 25 MG tablet Take 25 mg by mouth daily.   magnesium hydroxide (MILK OF MAGNESIA) 400 MG/5ML suspension If no BM in 3 days, give 30 cc Milk of Magnesium p.o. x 1 dose in 24 hours as needed (Do not use standing constipation orders for residents with renal failure CFR less than 30. Contact MD for orders) (Physician Order)   memantine (NAMENDA) 10 MG tablet Take 10 mg by mouth 2 (two) times daily.    Menthol, Topical Analgesic, (BIOFREEZE) 4 % GEL Apply 1 application topically in the morning, at noon, and at bedtime.   NON FORMULARY DIET: REGULAR, THIN LIQUIDS. STAFF TO ASSIST WITH ALL ORAL INTAKE   Nutritional Supplements (ENSURE PO) Take 237 mLs by mouth daily.   senna-docusate (SENOKOT-S) 8.6-50 MG tablet Take 1 tablet by mouth at bedtime as needed for mild constipation.   [DISCONTINUED] bisacodyl (DULCOLAX) 10 MG suppository as needed. If not relieved by MOM,  give 10 mg Bisacodyl suppositiory rectally X 1 dose in 24 hours as needed   No facility-administered encounter medications on file as of 10/08/2021.    Review of Systems  Unable to obtain due to dementia.   Immunization History  Administered Date(s) Administered   Influenza-Unspecified 09/27/2019, 10/16/2020   Moderna Sars-Covid-2 Vaccination 05/05/2020, 05/30/2020, 12/02/2020   PPD Test 01/09/2020   Pertinent  Health Maintenance Due  Topic Date Due   INFLUENZA VACCINE  07/27/2021   No flowsheet data found.   Vitals:    10/08/21 1452  BP: 118/61  Pulse: (!) 55  Resp: 20  Temp: 97.9 F (36.6 C)  Weight: 168 lb 3.2 oz (76.3 kg)  Height: 6' (1.829 m)   Body mass index is 22.81 kg/m.  Physical Exam  GENERAL APPEARANCE: Well nourished. In no acute distress. Normal body habitus SKIN:  Skin is warm and dry.  MOUTH and THROAT: Lips are without lesions. Oral mucosa is moist and without lesions.  RESPIRATORY: Breathing is even & unlabored, BS CTAB CARDIAC: RRR, no murmur,no extra heart sounds, no edema GI: Abdomen soft, normal BS, no masses, no tenderness NEUROLOGICAL: There is no tremor. Speech is clear.  Alert to self, disoriented to time and place PSYCHIATRIC: Affect and behavior are appropriate  Labs reviewed: Recent Labs    03/10/21 0000 03/29/21 0000 05/13/21 0000  NA 143 142 145  K 4.4 4.1 5.0  CL 108 109* 106  CO2 28* 25* 26*  BUN 27* 31* 19  CREATININE 1.0 1.9* 1.2  CALCIUM 8.8 8.6* 9.6   Recent Labs    03/10/21 0000 03/29/21 0000 04/13/21 0000  AST 16 16 20   ALT 12 11 22   ALKPHOS  --  126* 155*  ALBUMIN 3.5 3.5 3.5   Recent Labs    05/13/21 0000 05/22/21 0000 06/02/21 0000  WBC 11.4 8.0 7.4  HGB 12.9* 12.0* 11.6*  HCT 38* 35* 34*  PLT 193 185 190   Lab Results  Component Value Date   TSH 2.36 05/05/2020   Lab Results  Component Value Date   HGBA1C 5.5 01/09/2020   Lab Results  Component Value Date   CHOL 98 08/03/2021   HDL 41 08/03/2021   LDLCALC 47 08/03/2021   TRIG 51 08/03/2021   CHOLHDL 5.0 01/09/2020    Significant Diagnostic Results in last 30 days:  No results found.  Assessment/Plan  1. Essential hypertension -  BPs stable, continue losartan -    Monitor BPs  2. History of CVA (cerebrovascular accident) -   Stable, continue aspirin and atorvastatin -    Continue supportive care  3. Mood disorder (HCC) -   Mood is stable, continue Depakote  4. Alzheimer disease (HCC) -   Has severe cognitive impairment -   Continue supportive  care     Family/ staff Communication: Discussed plan of care with charge nurse.  Labs/tests ordered: None  Goals of care:   Long-term care   10/03/2021, DNP, MSN, FNP-BC [facility] [phone] (Monday-Friday 8:00 a.m. - 5:00 p.m.) [phone] (after hours)

## 2021-11-11 ENCOUNTER — Encounter: Payer: Self-pay | Admitting: Internal Medicine

## 2021-11-11 ENCOUNTER — Non-Acute Institutional Stay (SKILLED_NURSING_FACILITY): Payer: Medicare Other | Admitting: Internal Medicine

## 2021-11-11 DIAGNOSIS — I10 Essential (primary) hypertension: Secondary | ICD-10-CM | POA: Diagnosis not present

## 2021-11-11 DIAGNOSIS — Z8673 Personal history of transient ischemic attack (TIA), and cerebral infarction without residual deficits: Secondary | ICD-10-CM

## 2021-11-11 DIAGNOSIS — R1319 Other dysphagia: Secondary | ICD-10-CM | POA: Diagnosis not present

## 2021-11-11 DIAGNOSIS — E785 Hyperlipidemia, unspecified: Secondary | ICD-10-CM | POA: Diagnosis not present

## 2021-11-11 DIAGNOSIS — D649 Anemia, unspecified: Secondary | ICD-10-CM

## 2021-11-11 NOTE — Progress Notes (Signed)
   NURSING HOME LOCATION:  [facility] ROOM NUMBER:  315 A  CODE STATUS:  DNR  PCP:  Douglass Rivers MD  This is a nursing facility follow up visit of chronic medical diagnoses & to document compliance with Regulation 483.30 (c) in The Long Term Care Survey Manual Phase 2 which mandates caregiver visit ( visits can alternate among physician, PA or NP as per statutes) within 10 days of 30 days / 60 days/ 90 days post admission to SNF date    Interim medical record and care since last SNF visit was updated with review of diagnostic studies and change in clinical status since last visit were documented.  HPI: He is a permanent resident of this facility with medical diagnoses of Alzheimer's disease, history of DVT, and history of stroke complicated by dysphagia.  LDL was 47 in August, at goal of less than 70.  There has been slight progression of his anemia in June with H/H of 11.6/34.  Review of systems: Dementia prevented completion of review of systems.  The patient can give no focused responses and cannot follow commands. He spends most of the day sleeping in a lounge chair @ the Nurses' station.  Physical exam:  Pertinent or positive findings: Initially he was asleep in the lounge chair with mouth agape.  He is exhibiting hypopnea without frank apnea.  Pattern alopecia is noted.  Some temporal wasting is present.  Mandibular teeth are coated.  Clinically he exhibits bradycardia.  Low-grade expiratory rhonchi were noted.  Posterior tibial pulses are stronger than dorsalis pedis pulses.  There is some fusiform PIP enlargement diffusely.  General appearance: Adequately nourished; no acute distress, increased work of breathing is present.   Lymphatic: No lymphadenopathy about the head, neck, axilla. Eyes: No conjunctival inflammation or lid edema is present.  Ears:  External ear exam shows no significant lesions or deformities.   Nose:  External nasal examination shows no  deformity or inflammation. Nasal mucosa are pink and moist without lesions, exudates Neck:  No thyromegaly, masses, tenderness noted.    Heart:  No gallop, murmur, click, rub .  Lungs: without wheezes, rales, rubs. Abdomen: Bowel sounds are normal. Abdomen is soft and nontender with no organomegaly, hernias, masses. GU: Deferred  Extremities:  No cyanosis, clubbing, edema  Neurologic exam :Balance, Rhomberg, finger to nose testing could not be completed due to clinical state Skin: Warm & dry w/o tenting. No significant lesions or rash.  See summary under each active problem in the Problem List with associated updated therapeutic plan

## 2021-11-12 DIAGNOSIS — I10 Essential (primary) hypertension: Secondary | ICD-10-CM | POA: Insufficient documentation

## 2021-11-12 DIAGNOSIS — E785 Hyperlipidemia, unspecified: Secondary | ICD-10-CM | POA: Insufficient documentation

## 2021-11-12 NOTE — Assessment & Plan Note (Signed)
Lipid profile in August revealed an LDL of 47 on average dose statin therapy.  No change indicated.  Monitor annually.

## 2021-11-12 NOTE — Patient Instructions (Signed)
See assessment and plan under each diagnosis in the problem list and acutely for this visit 

## 2021-11-12 NOTE — Assessment & Plan Note (Signed)
There is been slight progression of his anemia with H/H of 11.6/34 in June.  No bleeding dyscrasias reported.  CBC will be updated.

## 2021-11-12 NOTE — Assessment & Plan Note (Addendum)
BP controlled; no change in ARB antihypertensive medications

## 2021-11-13 NOTE — Assessment & Plan Note (Signed)
There has been no change in neuromuscular or neurocognitive status

## 2021-11-13 NOTE — Assessment & Plan Note (Signed)
SNF Speech Therapist continues to monitor

## 2021-12-10 LAB — BASIC METABOLIC PANEL
BUN: 24 — AB (ref 4–21)
CO2: 28 — AB (ref 13–22)
Chloride: 107 (ref 99–108)
Creatinine: 1.3 (ref 0.6–1.3)
Glucose: 95
Potassium: 4.4 (ref 3.4–5.3)
Sodium: 142 (ref 137–147)

## 2021-12-10 LAB — CBC: RBC: 3.92 (ref 3.87–5.11)

## 2021-12-10 LAB — CBC AND DIFFERENTIAL
HCT: 39 — AB (ref 41–53)
Hemoglobin: 12.2 — AB (ref 13.5–17.5)
Platelets: 154 (ref 150–399)
WBC: 7.7

## 2021-12-10 LAB — COMPREHENSIVE METABOLIC PANEL WITH GFR
Albumin: 3.3 — AB (ref 3.5–5.0)
Calcium: 8.7 (ref 8.7–10.7)
GFR calc Af Amer: 59.76
GFR calc non Af Amer: 51.56

## 2021-12-10 LAB — HEPATIC FUNCTION PANEL
ALT: 17 (ref 10–40)
AST: 18 (ref 14–40)
Alkaline Phosphatase: 153 — AB (ref 25–125)
Bilirubin, Total: 0.5

## 2021-12-10 LAB — TSH: TSH: 4.25 (ref 0.41–5.90)

## 2021-12-14 ENCOUNTER — Non-Acute Institutional Stay (SKILLED_NURSING_FACILITY): Payer: Medicare Other | Admitting: Adult Health

## 2021-12-14 ENCOUNTER — Encounter: Payer: Self-pay | Admitting: Adult Health

## 2021-12-14 DIAGNOSIS — Z8673 Personal history of transient ischemic attack (TIA), and cerebral infarction without residual deficits: Secondary | ICD-10-CM

## 2021-12-14 DIAGNOSIS — N1831 Chronic kidney disease, stage 3a: Secondary | ICD-10-CM

## 2021-12-14 DIAGNOSIS — E785 Hyperlipidemia, unspecified: Secondary | ICD-10-CM | POA: Diagnosis not present

## 2021-12-14 DIAGNOSIS — G301 Alzheimer's disease with late onset: Secondary | ICD-10-CM

## 2021-12-14 DIAGNOSIS — F39 Unspecified mood [affective] disorder: Secondary | ICD-10-CM

## 2021-12-14 DIAGNOSIS — F02818 Dementia in other diseases classified elsewhere, unspecified severity, with other behavioral disturbance: Secondary | ICD-10-CM

## 2021-12-14 DIAGNOSIS — I10 Essential (primary) hypertension: Secondary | ICD-10-CM | POA: Diagnosis not present

## 2021-12-14 NOTE — Progress Notes (Signed)
Location:  [place] Room Number: 315-A Place of Service:  SNF (31) Provider:  Durenda Age, DNP, FNP-BC  Patient Care Team: Hendricks Limes, MD as PCP - General (Internal Medicine) [name], Senaida Lange, NP as Nurse Practitioner (Internal Medicine) Rehab, [place] ([place])  Extended Emergency Contact Information Primary Emergency Contact: Rodrick, Priestly Address: [place] [address] [place] of [place] Phone: [phone] Mobile Phone: [phone] Relation: Spouse Secondary Emergency Contact: Leiby, Stantz Mobile Phone: [phone] Relation: Son  Code Status: DNR   Goals of care: Advanced Directive information Advanced Directives 12/23/2021  Does Patient Have a Medical Advance Directive? Yes  Type of Paramedic of [place];Out of facility DNR (pink MOST or yellow form)  Does patient want to make changes to medical advance directive? No - Patient declined  Copy of [place] in Chart? Yes - validated most recent copy scanned in chart (See row information)  Pre-existing out of facility DNR order (yellow form or pink MOST form) Yellow form placed in chart (order not valid for inpatient use)     Chief Complaint  Patient presents with   Medical Management of Chronic Issues    Routine Visit    HPI:  Pt is a 85 y.o. male seen today for medical management of chronic diseases. He is a long-term care resident of [facility]. He has a PMH of dementia, CVA, DVT and COVID-19 infection.  Stage 3a chronic kidney disease ([place]) -  last GFR 51.56  Essential hypertension  -  SBPs ranging from 111 to 140, takes losartan 25 mg 1 tab daily  Dyslipidemia -  last cholesterol 98 and triglycerides 51, normal, takes atorvastatin 40 mg daily  History of CVA (cerebrovascular accident) -stable, takes aspirin 81 mg 1 tab daily and  atorvastatin 40 mg daily  Mood disorder ([place])  -  reported to have occasional resistance to care, takes Depakote 125 mg 2 capsules = 250 mg twice daily    Past Medical History:  Diagnosis Date   Alzheimer disease ([place]) 09/25/2019   Past Surgical History:  Procedure Laterality Date   APPENDECTOMY     BACK SURGERY     CHOLECYSTECTOMY     HERNIA REPAIR      Allergies  Allergen Reactions   Demerol [Meperidine] Nausea And Vomiting    Outpatient Encounter Medications as of 12/14/2021  Medication Sig   acetaminophen (TYLENOL) 325 MG tablet Take 650 mg by mouth every 6 (six) hours as needed.   aspirin EC 81 MG tablet Take 81 mg by mouth daily. Swallow whole.   atorvastatin (LIPITOR) 40 MG tablet Take 1 tablet (40 mg total) by mouth daily at 6 PM.   bisacodyl (DULCOLAX) 10 MG suppository If not relieved by MOM, give 10 mg Bisacodyl suppositiory rectally X 1 dose in 24 hours as needed   divalproex (DEPAKOTE) 125 MG DR tablet Take 250 mg by mouth 2 (two) times daily. mood disorder   losartan (COZAAR) 25 MG tablet Take 25 mg by mouth daily.   magnesium hydroxide (MILK OF MAGNESIA) 400 MG/5ML suspension If no BM in 3 days, give 30 cc Milk of Magnesium p.o. x 1 dose in 24 hours as needed (Do not use standing constipation orders for residents with renal failure CFR less than 30. Contact MD for orders) (Physician Order)   memantine (NAMENDA) 10 MG tablet Take 10 mg  by mouth 2 (two) times daily.    Menthol, Topical Analgesic, (BIOFREEZE) 4 % GEL Apply 1 application topically in the morning, at noon, and at bedtime.   NON FORMULARY DIET: REGULAR, THIN LIQUIDS. STAFF TO ASSIST WITH ALL ORAL INTAKE   Nutritional Supplements (ENSURE PO) Take 237 mLs by mouth daily.   Nutritional Supplements (NUTRITIONAL SUPPLEMENT PO) MAGIC CUP: GIVE ONE MAGIC CUP BY MOUTH TWICE DAILY TO PROMOTE WEIGHT GAIN   senna-docusate (SENOKOT-S) 8.6-50 MG tablet Take 1 tablet by mouth at bedtime as needed for mild constipation.    Sodium Phosphates (RA SALINE ENEMA RE) If not relieved by Biscodyl suppository, give disposable Saline Enema rectally X 1 dose/24 hrs as needed   No facility-administered encounter medications on file as of 12/14/2021.    Review of Systems  Unable to obtain due to dementia.   Immunization History  Administered Date(s) Administered   Influenza-Unspecified 09/27/2019, 10/16/2020   Moderna Sars-Covid-2 Vaccination 05/05/2020, 05/30/2020, 12/02/2020   PPD Test 01/09/2020   Pertinent  Health Maintenance Due  Topic Date Due   INFLUENZA VACCINE  07/27/2021   Fall Risk 01/13/2020 01/13/2020 01/14/2020 01/14/2020 01/15/2020  Patient Fall Risk Level High fall risk High fall risk High fall risk High fall risk High fall risk     Vitals:   12/14/21 1105  BP: (!) 111/56  Pulse: 64  Resp: 17  Temp: 97.8 F (36.6 C)  Weight: 169 lb 11.2 oz (77 kg)  Height: 6' (1.829 m)   Body mass index is 23.02 kg/m.  Physical Exam  GENERAL APPEARANCE: Well nourished. In no acute distress.  SKIN:  Skin is warm and dry.  MOUTH and THROAT: Lips are without lesions. Oral mucosa is moist and without lesions.  RESPIRATORY: Breathing is even & unlabored, BS CTAB CARDIAC: RRR, no murmur,no extra heart sounds, no edema GI: Abdomen soft, normal BS, no masses, no tenderness NEUROLOGICAL: There is no tremor. Does not answer queries PSYCHIATRIC:  Affect and behavior are appropriate  Labs reviewed: Recent Labs    03/29/21 0000 05/13/21 0000 12/10/21 0000  NA 142 145 142  K 4.1 5.0 4.4  CL 109* 106 107  CO2 25* 26* 28*  BUN 31* 19 24*  CREATININE 1.9* 1.2 1.3  CALCIUM 8.6* 9.6 8.7   Recent Labs    03/29/21 0000 04/13/21 0000 12/10/21 0000  AST 16 20 18   ALT 11 22 17   ALKPHOS 126* 155* 153*  ALBUMIN 3.5 3.5 3.3*   Recent Labs    05/22/21 0000 06/02/21 0000 12/10/21 0000  WBC 8.0 7.4 7.7  HGB 12.0* 11.6* 12.2*  HCT 35* 34* 39*  PLT 185 190 154   Lab Results  Component Value Date    TSH 4.25 12/10/2021   Lab Results  Component Value Date   HGBA1C 5.5 01/09/2020   Lab Results  Component Value Date   CHOL 98 08/03/2021   HDL 41 08/03/2021   LDLCALC 47 08/03/2021   TRIG 51 08/03/2021   CHOLHDL 5.0 01/09/2020    Significant Diagnostic Results in last 30 days:  No results found.  Assessment/Plan  1. Stage 3a chronic kidney disease (HCC) Chronic and stable Encourage proper hydration Follow metabolic panel Avoid nephrotoxic meds (NSAIDS)  2. Essential hypertension -Blood pressure well controlled Continue current medications  3. Dyslipidemia Lab Results  Component Value Date   CHOL 98 08/03/2021   HDL 41 08/03/2021   LDLCALC 47 08/03/2021   TRIG 51 08/03/2021   CHOLHDL 5.0 01/09/2020   -  Continue atorvastatin  4. History of CVA (cerebrovascular accident) -  stable The current medical regimen is effective;  continue present plan and medications.  5. Mood disorder (HCC) -  stable The current medical regimen is effective;  continue present plan and medications.  6. Late onset Alzheimer's disease with behavioral disturbance (HCC) -  stable, continue memantine 10 mg 1 tab twice a day   Family/ staff Communication: Discussed plan of care with charge nurse.  Labs/tests ordered:  None  Goals of care:   Long-term care   Kenard Gower, DNP, MSN, FNP-BC [facility] [phone] (Monday-Friday 8:00 a.m. - 5:00 p.m.) [phone] (after hours)

## 2021-12-23 ENCOUNTER — Non-Acute Institutional Stay (SKILLED_NURSING_FACILITY): Payer: Medicare Other | Admitting: Adult Health

## 2021-12-23 ENCOUNTER — Encounter: Payer: Self-pay | Admitting: Adult Health

## 2021-12-23 DIAGNOSIS — Z8673 Personal history of transient ischemic attack (TIA), and cerebral infarction without residual deficits: Secondary | ICD-10-CM

## 2021-12-23 DIAGNOSIS — G301 Alzheimer's disease with late onset: Secondary | ICD-10-CM

## 2021-12-23 DIAGNOSIS — E785 Hyperlipidemia, unspecified: Secondary | ICD-10-CM

## 2021-12-23 DIAGNOSIS — F02818 Dementia in other diseases classified elsewhere, unspecified severity, with other behavioral disturbance: Secondary | ICD-10-CM

## 2021-12-23 DIAGNOSIS — Z7189 Other specified counseling: Secondary | ICD-10-CM

## 2021-12-23 DIAGNOSIS — I10 Essential (primary) hypertension: Secondary | ICD-10-CM

## 2021-12-23 DIAGNOSIS — N1831 Chronic kidney disease, stage 3a: Secondary | ICD-10-CM

## 2021-12-23 NOTE — Progress Notes (Signed)
Location:  [facility] Room Number: 315-A Place of Service:  SNF (31) Provider:  Kenard Gower, DNP, FNP-BC  Patient Care Team: Pecola Lawless, MD as PCP - General (Internal Medicine) [name], [name], NP as Nurse Practitioner (Internal Medicine) Rehab, [facility] (Skilled Nursing Facility)  Extended Emergency Contact Information Primary Emergency Contact: Carlyle, Mcelrath Address: [address]          [place] [address] [place] of [place] Home Phone: [phone] Mobile Phone: [phone] Relation: Spouse Secondary Emergency Contact: Yovany, Clock Mobile Phone: [phone] Relation: Son  Code Status:  DNR  Goals of care: Advanced Directive information Advanced Directives 12/23/2021  Does Patient Have a Medical Advance Directive? Yes  Type of Estate agent of [place];Out of facility DNR (pink MOST or yellow form)  Does patient want to make changes to medical advance directive? No - Patient declined  Copy of Healthcare Power of Attorney in Chart? Yes - validated most recent copy scanned in chart (See row information)  Pre-existing out of facility DNR order (yellow form or pink MOST form) Yellow form placed in chart (order not valid for inpatient use)     Chief Complaint  Patient presents with   Acute Visit    Care plan meeting     HPI:  Pt is a 85 y.o. male who had a care plan meeting today attended by life enrichment, NP and son, who attended via telephone conference. He remains to be DNR. Discussed medications, vital signs and weights. Discussed labs, as well. Answered queries of son. He had the second COVID-19 booster on 12/18/21. No side effects noted. Wife visits regularly. The meeting lasted for 15 minutes.   Past Medical History:  Diagnosis Date   Alzheimer disease (HCC) 09/25/2019   Past Surgical History:  Procedure Laterality Date   APPENDECTOMY     BACK SURGERY      CHOLECYSTECTOMY     HERNIA REPAIR      Allergies  Allergen Reactions   Demerol [Meperidine] Nausea And Vomiting    Outpatient Encounter Medications as of 12/23/2021  Medication Sig   acetaminophen (TYLENOL) 325 MG tablet Take 650 mg by mouth every 6 (six) hours as needed.   aspirin EC 81 MG tablet Take 81 mg by mouth daily. Swallow whole.   atorvastatin (LIPITOR) 40 MG tablet Take 1 tablet (40 mg total) by mouth daily at 6 PM.   bisacodyl (DULCOLAX) 10 MG suppository If not relieved by MOM, give 10 mg Bisacodyl suppositiory rectally X 1 dose in 24 hours as needed   divalproex (DEPAKOTE) 125 MG DR tablet Take 250 mg by mouth 2 (two) times daily. mood disorder   losartan (COZAAR) 25 MG tablet Take 25 mg by mouth daily.   magnesium hydroxide (MILK OF MAGNESIA) 400 MG/5ML suspension If no BM in 3 days, give 30 cc Milk of Magnesium p.o. x 1 dose in 24 hours as needed (Do not use standing constipation orders for residents with renal failure CFR less than 30. Contact MD for orders) (Physician Order)   memantine (NAMENDA) 10 MG tablet Take 10 mg by mouth 2 (two) times daily.    Menthol, Topical Analgesic, (BIOFREEZE) 4 % GEL Apply 1 application topically in the morning, at noon, and at bedtime.   NON FORMULARY DIET: REGULAR, THIN LIQUIDS. STAFF TO ASSIST WITH ALL ORAL INTAKE   Nutritional Supplements (ENSURE PO) Take 237 mLs by mouth daily.   Nutritional Supplements (NUTRITIONAL SUPPLEMENT PO) MAGIC CUP:  GIVE ONE MAGIC CUP BY MOUTH TWICE DAILY TO PROMOTE WEIGHT GAIN   senna-docusate (SENOKOT-S) 8.6-50 MG tablet Take 1 tablet by mouth at bedtime as needed for mild constipation.   Sodium Phosphates (RA SALINE ENEMA RE) If not relieved by Biscodyl suppository, give disposable Saline Enema rectally X 1 dose/24 hrs as needed   No facility-administered encounter medications on file as of 12/23/2021.    Review of Systems  Unable to obtain due to dementia.    Immunization History  Administered  Date(s) Administered   Influenza-Unspecified 09/27/2019, 10/16/2020   Moderna Sars-Covid-2 Vaccination 05/05/2020, 05/30/2020, 12/02/2020   PPD Test 01/09/2020   Pertinent  Health Maintenance Due  Topic Date Due   INFLUENZA VACCINE  07/27/2021   Fall Risk 01/13/2020 01/13/2020 01/14/2020 01/14/2020 01/15/2020  Patient Fall Risk Level High fall risk High fall risk High fall risk High fall risk High fall risk     Vitals:   12/23/21 1221  BP: (!) 131/51  Pulse: (!) 56  Resp: 18  Temp: (!) 96.8 F (36 C)  Weight: 169 lb 11.2 oz (77 kg)  Height: 6' (1.829 m)   Body mass index is 23.02 kg/m.  Physical Exam Constitutional:      General: He is not in acute distress. HENT:     Head: Normocephalic and atraumatic.     Nose: Nose normal. No congestion.     Mouth/Throat:     Mouth: Mucous membranes are moist.  Eyes:     Conjunctiva/sclera: Conjunctivae normal.  Cardiovascular:     Rate and Rhythm: Normal rate and regular rhythm.     Pulses: Normal pulses.     Heart sounds: Normal heart sounds.  Pulmonary:     Effort: Pulmonary effort is normal.     Breath sounds: Normal breath sounds.  Abdominal:     General: Bowel sounds are normal.     Palpations: Abdomen is soft.  Musculoskeletal:        General: No swelling or tenderness.     Cervical back: Normal range of motion.     Right lower leg: No edema.     Left lower leg: No edema.  Skin:    General: Skin is warm and dry.  Neurological:     General: No focal deficit present.     Mental Status: He is alert.  Psychiatric:        Mood and Affect: Mood normal.        Behavior: Behavior normal.       Labs reviewed: Recent Labs    03/29/21 0000 05/13/21 0000 12/10/21 0000  NA 142 145 142  K 4.1 5.0 4.4  CL 109* 106 107  CO2 25* 26* 28*  BUN 31* 19 24*  CREATININE 1.9* 1.2 1.3  CALCIUM 8.6* 9.6 8.7   Recent Labs    03/29/21 0000 04/13/21 0000 12/10/21 0000  AST 16 20 18   ALT 11 22 17   ALKPHOS 126* 155* 153*   ALBUMIN 3.5 3.5 3.3*   Recent Labs    05/22/21 0000 06/02/21 0000 12/10/21 0000  WBC 8.0 7.4 7.7  HGB 12.0* 11.6* 12.2*  HCT 35* 34* 39*  PLT 185 190 154   Lab Results  Component Value Date   TSH 4.25 12/10/2021   Lab Results  Component Value Date   HGBA1C 5.5 01/09/2020   Lab Results  Component Value Date   CHOL 98 08/03/2021   HDL 41 08/03/2021   LDLCALC 47 08/03/2021   TRIG 51 08/03/2021  CHOLHDL 5.0 01/09/2020    Significant Diagnostic Results in last 30 days:  No results found.  Assessment/Plan  1. Advance care planning -   Remains to be DNR -    Discussed medications, vital signs, labs and weights  2. Essential hypertension -   -Blood pressure well controlled Continue current medications  3. Dyslipidemia Lab Results  Component Value Date   CHOL 98 08/03/2021   HDL 41 08/03/2021   LDLCALC 47 08/03/2021   TRIG 51 08/03/2021   CHOLHDL 5.0 01/09/2020    -    Continue atorvastatin  4. History of CVA (cerebrovascular accident) -    Stable, continue aspirin and atorvastatin  5. Stage 3a chronic kidney disease (HCC) Chronic and stable Encourage proper hydration Avoid nephrotoxic meds (NSAIDS)  6. Late onset Alzheimer's disease with behavioral disturbance ([place]) -   Stable -   Continue memantine     Family/ staff Communication: Discussed plan of care with resident and charge nurse  Labs/tests ordered:  None    Durenda Age, DNP, MSN, FNP-BC [facility] and Adult Medicine [phone] (Monday-Friday 8:00 a.m. - 5:00 p.m.) [phone] (after hours)

## 2022-01-04 ENCOUNTER — Non-Acute Institutional Stay (SKILLED_NURSING_FACILITY): Payer: Medicare Other | Admitting: Adult Health

## 2022-01-04 ENCOUNTER — Encounter: Payer: Self-pay | Admitting: Adult Health

## 2022-01-04 DIAGNOSIS — Z8673 Personal history of transient ischemic attack (TIA), and cerebral infarction without residual deficits: Secondary | ICD-10-CM | POA: Diagnosis not present

## 2022-01-04 DIAGNOSIS — I10 Essential (primary) hypertension: Secondary | ICD-10-CM

## 2022-01-04 DIAGNOSIS — N1831 Chronic kidney disease, stage 3a: Secondary | ICD-10-CM

## 2022-01-04 DIAGNOSIS — G301 Alzheimer's disease with late onset: Secondary | ICD-10-CM

## 2022-01-04 DIAGNOSIS — F02818 Dementia in other diseases classified elsewhere, unspecified severity, with other behavioral disturbance: Secondary | ICD-10-CM

## 2022-01-04 DIAGNOSIS — F39 Unspecified mood [affective] disorder: Secondary | ICD-10-CM

## 2022-01-04 DIAGNOSIS — E785 Hyperlipidemia, unspecified: Secondary | ICD-10-CM | POA: Diagnosis not present

## 2022-01-04 NOTE — Progress Notes (Signed)
Location:  [facility] Room Number: [place] of Service:  SNF (31) Provider:  Durenda Age, DNP, FNP-BC  Patient Care Team: Hendricks Limes, MD as PCP - General (Internal Medicine) [name], Senaida Lange, NP as Nurse Practitioner (Internal Medicine) Rehab, [facility] ([place])  Extended Emergency Contact Information Primary Emergency Contact: Evin, Chaddock Address: [place] [address] [place] of [place] Phone: [phone] Mobile Phone: [phone] Relation: Spouse Secondary Emergency Contact: Adren, Bissonnette Mobile Phone: [phone] Relation: Son  Code Status:    DNR  Goals of care: Advanced Directive information Advanced Directives 12/23/2021  Does Patient Have a Medical Advance Directive? Yes  Type of Paramedic of [place];Out of facility DNR (pink MOST or yellow form)  Does patient want to make changes to medical advance directive? No - Patient declined  Copy of [place] in Chart? Yes - validated most recent copy scanned in chart (See row information)  Pre-existing out of facility DNR order (yellow form or pink MOST form) Yellow form placed in chart (order not valid for inpatient use)     Chief Complaint  Patient presents with   Medical Management of Chronic Issues    Routine Visit    HPI:  Pt is a 86 y.o. male seen today for medical management of chronic diseases.  He has a PMH of dementia, CVA, DVT and COVID-19 infection.  He is a long-term care resident of [facility].  Essential hypertension  -  SBPs ranging from 115-139, he states losartan 25 mg 1 tab daily  History of CVA (cerebrovascular accident) -he takes atorvastatin 40 mg 1 tab daily and aspirin 81 mg 1 tab daily  Stage 3a chronic kidney disease (HCC) -  GFR 51.56, stable  Mood disorder (HCC) -  has reported periods of agitation, takes  Depakote DR 125 mg 2 capsules = 250 mg twice daily    Past Medical History:  Diagnosis Date   Alzheimer disease ([place]) 09/25/2019   Past Surgical History:  Procedure Laterality Date   APPENDECTOMY     BACK SURGERY     CHOLECYSTECTOMY     HERNIA REPAIR      Allergies  Allergen Reactions   Demerol [Meperidine] Nausea And Vomiting    Outpatient Encounter Medications as of 01/04/2022  Medication Sig   acetaminophen (TYLENOL) 325 MG tablet Take 650 mg by mouth every 6 (six) hours as needed.   aspirin EC 81 MG tablet Take 81 mg by mouth daily. Swallow whole.   atorvastatin (LIPITOR) 40 MG tablet Take 1 tablet (40 mg total) by mouth daily at 6 PM.   bisacodyl (DULCOLAX) 10 MG suppository If not relieved by MOM, give 10 mg Bisacodyl suppositiory rectally X 1 dose in 24 hours as needed   divalproex (DEPAKOTE) 125 MG DR tablet Take 250 mg by mouth 2 (two) times daily. mood disorder   losartan (COZAAR) 25 MG tablet Take 25 mg by mouth daily.   magnesium hydroxide (MILK OF MAGNESIA) 400 MG/5ML suspension If no BM in 3 days, give 30 cc Milk of Magnesium p.o. x 1 dose in 24 hours as needed (Do not use standing constipation orders for residents with renal failure CFR less than 30. Contact MD for orders) (Physician Order)   memantine (NAMENDA) 10 MG tablet Take 10 mg by mouth 2 (two) times daily.    Menthol, Topical Analgesic, (  BIOFREEZE) 4 % GEL Apply 1 application topically in the morning, at noon, and at bedtime.   NON FORMULARY DIET: REGULAR, THIN LIQUIDS. STAFF TO ASSIST WITH ALL ORAL INTAKE   Nutritional Supplements (ENSURE PO) Take 237 mLs by mouth daily.   Nutritional Supplements (NUTRITIONAL SUPPLEMENT PO) MAGIC CUP: GIVE ONE MAGIC CUP BY MOUTH TWICE DAILY TO PROMOTE WEIGHT GAIN   senna-docusate (SENOKOT-S) 8.6-50 MG tablet Take 1 tablet by mouth at bedtime as needed for mild constipation.   Sodium Phosphates (RA SALINE ENEMA RE) If not relieved by Biscodyl suppository, give disposable  Saline Enema rectally X 1 dose/24 hrs as needed   No facility-administered encounter medications on file as of 01/04/2022.    Review of Systems unable to obtain due to dementia.    Immunization History  Administered Date(s) Administered   Influenza-Unspecified 09/27/2019, 10/16/2020   Moderna Sars-Covid-2 Vaccination 05/05/2020, 05/30/2020, 12/02/2020   PPD Test 01/09/2020   Pertinent  Health Maintenance Due  Topic Date Due   INFLUENZA VACCINE  07/27/2021   Fall Risk 01/13/2020 01/13/2020 01/14/2020 01/14/2020 01/15/2020  Patient Fall Risk Level High fall risk High fall risk High fall risk High fall risk High fall risk     Vitals:   01/04/22 1427  BP: 125/60  Pulse: 60  Resp: 20  Temp: (!) 97.3 F (36.3 C)  Weight: 168 lb 1.6 oz (76.2 kg)  Height: 6' (1.829 m)   Body mass index is 22.8 kg/m.  Physical Exam Constitutional:      Appearance: He is normal weight.  HENT:     Head: Normocephalic and atraumatic.     Mouth/Throat:     Mouth: Mucous membranes are moist.  Eyes:     Conjunctiva/sclera: Conjunctivae normal.  Cardiovascular:     Rate and Rhythm: Normal rate and regular rhythm.     Pulses: Normal pulses.     Heart sounds: Normal heart sounds.  Pulmonary:     Effort: Pulmonary effort is normal.     Breath sounds: Normal breath sounds.  Abdominal:     General: Bowel sounds are normal.     Palpations: Abdomen is soft.  Musculoskeletal:        General: No swelling. Normal range of motion.     Cervical back: Normal range of motion.  Skin:    General: Skin is warm and dry.     Comments: Posterior right hand skin tear and bruising.  Neurological:     Mental Status: He is alert. Mental status is at baseline. He is disoriented.  Psychiatric:        Mood and Affect: Mood normal.      Labs reviewed: Recent Labs    03/29/21 0000 05/13/21 0000 12/10/21 0000  NA 142 145 142  K 4.1 5.0 4.4  CL 109* 106 107  CO2 25* 26* 28*  BUN 31* 19 24*  CREATININE 1.9*  1.2 1.3  CALCIUM 8.6* 9.6 8.7   Recent Labs    03/29/21 0000 04/13/21 0000 12/10/21 0000  AST 16 20 18   ALT 11 22 17   ALKPHOS 126* 155* 153*  ALBUMIN 3.5 3.5 3.3*   Recent Labs    05/22/21 0000 06/02/21 0000 12/10/21 0000  WBC 8.0 7.4 7.7  HGB 12.0* 11.6* 12.2*  HCT 35* 34* 39*  PLT 185 190 154   Lab Results  Component Value Date   TSH 4.25 12/10/2021   Lab Results  Component Value Date   HGBA1C 5.5 01/09/2020   Lab Results  Component Value Date   CHOL 98 08/03/2021   HDL 41 08/03/2021   LDLCALC 47 08/03/2021   TRIG 51 08/03/2021   CHOLHDL 5.0 01/09/2020    Significant Diagnostic Results in last 30 days:  No results found.  Assessment/Plan  1. Essential hypertension -    -Blood pressure well controlled Continue current medications  2. History of CVA (cerebrovascular accident) -   Stable, continue aspirin and atorvastatin  3. Dyslipidemia Lab Results  Component Value Date   CHOL 98 08/03/2021   HDL 41 08/03/2021   LDLCALC 47 08/03/2021   TRIG 51 08/03/2021   CHOLHDL 5.0 01/09/2020   -   Continue atorvastatin  4. Stage 3a chronic kidney disease (HCC) Lab Results  Component Value Date   NA 142 12/10/2021   K 4.4 12/10/2021   CO2 28 (A) 12/10/2021   GLUCOSE 143 (H) 01/12/2020   BUN 24 (A) 12/10/2021   CREATININE 1.3 12/10/2021   CALCIUM 8.7 12/10/2021   GFRNONAA 51.56 12/10/2021   -Stable   5. Mood disorder (HCC) -  Stable, continue Depakote  6. Late onset Alzheimer's disease with behavioral disturbance (HCC) -  BIMS score is 0, ranging in severe cognitive impairment -   Continue memantine    Family/ staff Communication: Discussed plan of care with charge nurse.  Labs/tests ordered:   None    Durenda Age, DNP, MSN, FNP-BC [facility] and Adult Medicine [phone] (Monday-Friday 8:00 a.m. - 5:00 p.m.) [phone] (after hours)

## 2022-02-04 ENCOUNTER — Non-Acute Institutional Stay (SKILLED_NURSING_FACILITY): Payer: Medicare Other | Admitting: Internal Medicine

## 2022-02-04 ENCOUNTER — Encounter: Payer: Self-pay | Admitting: Internal Medicine

## 2022-02-04 DIAGNOSIS — F0280 Dementia in other diseases classified elsewhere without behavioral disturbance: Secondary | ICD-10-CM

## 2022-02-04 DIAGNOSIS — D649 Anemia, unspecified: Secondary | ICD-10-CM

## 2022-02-04 DIAGNOSIS — I10 Essential (primary) hypertension: Secondary | ICD-10-CM

## 2022-02-04 DIAGNOSIS — G309 Alzheimer's disease, unspecified: Secondary | ICD-10-CM | POA: Diagnosis not present

## 2022-02-04 DIAGNOSIS — N1831 Chronic kidney disease, stage 3a: Secondary | ICD-10-CM

## 2022-02-04 NOTE — Assessment & Plan Note (Addendum)
Dysarthric, garbled speech is present which is unfocused.  He cannot follow commands & all activities are purposeless.

## 2022-02-04 NOTE — Assessment & Plan Note (Signed)
BP controlled; no change in antihypertensive medications  

## 2022-02-04 NOTE — Assessment & Plan Note (Addendum)
Current creatinine is 1.3 with a GFR of 51.56 indicating CKD stage IIIa.  He is on ARB;he is on an ARB. No change indicatedin  medication regimen unless there is progression of CKD.

## 2022-02-04 NOTE — Patient Instructions (Signed)
See assessment and plan under each diagnosis in the problem list and acutely for this visit 

## 2022-02-04 NOTE — Assessment & Plan Note (Signed)
Current H/H 12.2/39 improved from 11.6/34.  No bleeding dyscrasias reported.  No change indicated.

## 2022-02-04 NOTE — Progress Notes (Signed)
NURSING HOME LOCATION:  [facility] ROOM NUMBER: [id number]  CODE STATUS: DNR  PCP: Kenard Gower NP  This is a nursing facility follow up visit of chronic medical diagnoses & to document compliance with Regulation 483.30 (c) in The Long Term Care Survey Manual Phase 2 which mandates caregiver visit ( visits can alternate among physician, PA or NP as per statutes) within 10 days of 30 days / 60 days/ 90 days post admission to SNF date    Interim medical record and care since last SNF visit was updated with review of diagnostic studies and change in clinical status since last visit were documented.  HPI:He is a permanent resident of facility with medical diagnoses of dyslipidemia, essential hypertension, and Alzheimer's dementia.Actually CNS imaging suggests he has vascular dementia as does his age of onset of the dementia.  Most recent labs were completed 12/10/2021.  Albumin was 3.3.  Creatinine was 1.3 with a GFR of 51.56 indicating CKD stage IIIa.  Anemia was stable to improved with H/H of 12.2/39.  Platelet count was low normal at 154,000.  TSH was therapeutic at 4.25.  Review of systems: Dementia invalidated responses.  Responses were nonfocused and garbled. He also exhibited purposeless activity with his hands moving his neck pillow around without definitive objective. His wife was present and stated that she has noted no new signs or symptoms.  Constitutional: No fever, significant weight change, fatigue  Eyes: No redness, discharge, pain, vision change ENT/mouth: No nasal congestion,  purulent discharge, earache, change in hearing, sore throat  Cardiovascular: No chest pain, palpitations, paroxysmal nocturnal dyspnea, claudication, edema  Respiratory: No cough, sputum production, hemoptysis, DOE, significant snoring, apnea   Gastrointestinal: No heartburn, dysphagia, abdominal pain, nausea /vomiting, rectal bleeding, melena, change in bowels Genitourinary:  No dysuria, hematuria, pyuria, incontinence, nocturia Musculoskeletal: No joint stiffness, joint swelling, weakness, pain Dermatologic: No rash, pruritus, change in appearance of skin Neurologic: No dizziness, headache, syncope, seizures, numbness, tingling Psychiatric: No significant anxiety, depression, insomnia, anorexia Endocrine: No change in hair/skin/nails, excessive thirst, excessive hunger, excessive urination  Hematologic/lymphatic: No significant bruising, lymphadenopathy, abnormal bleeding  Physical exam:  Pertinent or positive findings: Pattern alopecia is present.  Facies are blank with a somewhat puzzled appearance . Visible dentition is markedly good.  Heart rhythm is slow and regular.  Breath sounds are decreased.  He has 1/2+ edema at the sock line.  Pedal pulses are strong.  Interosseous wasting is noted.  General appearance: Adequately nourished; no acute distress, increased work of breathing is present.   Lymphatic: No lymphadenopathy about the head, neck, axilla. Eyes: No conjunctival inflammation or lid edema is present. There is no scleral icterus. Ears:  External ear exam shows no significant lesions or deformities.   Nose:  External nasal examination shows no deformity or inflammation. Nasal mucosa are pink and moist without lesions, exudates Oral exam:  Lips and gums are healthy appearing. There is no oropharyngeal erythema or exudate. Neck:  No thyromegaly, masses, tenderness noted.    Heart:  No gallop, murmur, click, rub .  Lungs:  without wheezes, rhonchi, rales, rubs. Abdomen: Bowel sounds are normal. Abdomen is soft and nontender with no organomegaly, hernias, masses. GU: Deferred  Extremities:  No cyanosis, clubbing  Neurologic exam :Balance, Rhomberg, finger to nose testing could not be completed due to clinical state Skin: Warm & dry w/o tenting. No significant lesions or rash.  See summary under each active problem in the Problem List with associated  updated therapeutic plan

## 2022-03-01 ENCOUNTER — Encounter: Payer: Self-pay | Admitting: Adult Health

## 2022-03-01 ENCOUNTER — Non-Acute Institutional Stay (SKILLED_NURSING_FACILITY): Payer: Medicare Other | Admitting: Adult Health

## 2022-03-01 DIAGNOSIS — F39 Unspecified mood [affective] disorder: Secondary | ICD-10-CM

## 2022-03-01 DIAGNOSIS — F02818 Dementia in other diseases classified elsewhere, unspecified severity, with other behavioral disturbance: Secondary | ICD-10-CM

## 2022-03-01 DIAGNOSIS — Z8673 Personal history of transient ischemic attack (TIA), and cerebral infarction without residual deficits: Secondary | ICD-10-CM

## 2022-03-01 DIAGNOSIS — G301 Alzheimer's disease with late onset: Secondary | ICD-10-CM | POA: Diagnosis not present

## 2022-03-01 DIAGNOSIS — I10 Essential (primary) hypertension: Secondary | ICD-10-CM | POA: Diagnosis not present

## 2022-03-01 NOTE — Progress Notes (Signed)
? ?Location:  [facility] ?Nursing Home Room Number: 315-A ?Place of Service:  SNF (31) ?Provider:  Kenard Gower, DNP, FNP-BC ? ?Patient Care Team: ?Pecola Lawless, MD as PCP - General (Internal Medicine) ?[name], [name], NP as Nurse Practitioner (Internal Medicine) ?Rehab, [facility] (Skilled Nursing Facility) ? ?Extended Emergency Contact Information ?Primary Emergency Contact: Madry,Deloris J ?Address: [address] ?         [place] [address] [place] ?Home Phone: [phone] ?Mobile Phone: [phone] ?Relation: Spouse ?Secondary Emergency Contact: Frankey Poot ?Mobile Phone: [phone] ?Relation: Son ? ?Code Status:  Full Code ? ?Goals of care: Advanced Directive information ?Advanced Directives 03/01/2022  ?Does Patient Have a Medical Advance Directive? Yes  ?Type of Estate agent of [place];Out of facility DNR (pink MOST or yellow form)  ?Does patient want to make changes to medical advance directive? No - Patient declined  ?Copy of Healthcare Power of Attorney in Chart? Yes - validated most recent copy scanned in chart (See row information)  ?Pre-existing out of facility DNR order (yellow form or pink MOST form) Yellow form placed in chart (order not valid for inpatient use)  ? ? ? ?Chief Complaint  ?Patient presents with  ? Medical Management of Chronic Issues  ?  Routine Visit  ? ? ?HPI:  ?Pt is a 86 y.o. male seen today for medical management of chronic diseases. He has a PMH of dementia, CVA, DVT and COVID-19 infection.  He is a long-term care resident of [facility]. ? ?History of CVA (cerebrovascular accident) -  has left- sided weakness, takes aspirin 81 mg 1 tab daily and atorvastatin 40 mg 1 tab daily ? ?Essential hypertension -  SBPs ranging from 107-140, states losartan 25 mg 1 tab daily ? ?Mood disorder (HCC) -  has occasional agitation/refusal of care, takes Depakote DR 125 mg 2 capsules =  250 mg twice daily ? ?Late onset Alzheimer's disease with behavioral disturbance (HCC) -   takes memantine 10 mg 1 tab twice a day ? ? ?Past Medical History:  ?Diagnosis Date  ? Alzheimer disease (HCC) 09/25/2019  ? ?Past Surgical History:  ?Procedure Laterality Date  ? APPENDECTOMY    ? BACK SURGERY    ? CHOLECYSTECTOMY    ? HERNIA REPAIR    ? ? ?Allergies  ?Allergen Reactions  ? Demerol [Meperidine] Nausea And Vomiting  ? ? ?Outpatient Encounter Medications as of 03/01/2022  ?Medication Sig  ? acetaminophen (TYLENOL) 325 MG tablet Take 650 mg by mouth every 6 (six) hours as needed.  ? aspirin EC 81 MG tablet Take 81 mg by mouth daily. Swallow whole.  ? atorvastatin (LIPITOR) 40 MG tablet Take 1 tablet (40 mg total) by mouth daily at 6 PM.  ? bisacodyl (DULCOLAX) 10 MG suppository If not relieved by MOM, give 10 mg Bisacodyl suppositiory rectally X 1 dose in 24 hours as needed  ? divalproex (DEPAKOTE) 125 MG DR tablet Take 250 mg by mouth 2 (two) times daily. mood disorder  ? losartan (COZAAR) 25 MG tablet Take 25 mg by mouth daily.  ? magnesium hydroxide (MILK OF MAGNESIA) 400 MG/5ML suspension If no BM in 3 days, give 30 cc Milk of Magnesium p.o. x 1 dose in 24 hours as needed (Do not use standing constipation orders for residents with renal failure CFR less than 30. Contact MD for orders) (Physician Order)  ? memantine (NAMENDA) 10 MG tablet Take 10 mg by mouth 2 (two)  times daily.   ? Menthol, Topical Analgesic, (BIOFREEZE) 4 % GEL Apply 1 application topically in the morning, at noon, and at bedtime.  ? NON FORMULARY DIET: REGULAR, THIN LIQUIDS. STAFF TO ASSIST WITH ALL ORAL INTAKE  ? Nutritional Supplements (ENSURE PO) Take 237 mLs by mouth daily.  ? Nutritional Supplements (NUTRITIONAL SUPPLEMENT PO) MAGIC CUP: GIVE ONE MAGIC CUP BY MOUTH TWICE DAILY TO PROMOTE WEIGHT GAIN  ? senna-docusate (SENOKOT-S) 8.6-50 MG tablet Take 1 tablet by mouth at bedtime as needed for mild constipation.  ? Sodium Phosphates (RA  SALINE ENEMA RE) If not relieved by Biscodyl suppository, give disposable Saline Enema rectally X 1 dose/24 hrs as needed  ? ?No facility-administered encounter medications on file as of 03/01/2022.  ? ? ?Review of Systems   unable to obtain due to dementia ? ? ?Immunization History  ?Administered Date(s) Administered  ? Fluad Quad(high Dose 65+) 10/07/2021  ? Influenza-Unspecified 09/27/2019, 10/16/2020  ? Moderna Sars-Covid-2 Vaccination 05/05/2020, 05/30/2020, 12/02/2020  ? PPD Test 01/09/2020  ? Tdap 10/20/2021  ? Zoster Recombinat (Shingrix) 05/27/2021  ? ?Pertinent  Health Maintenance Due  ?Topic Date Due  ? INFLUENZA VACCINE  Completed  ? ?Fall Risk 01/13/2020 01/13/2020 01/14/2020 01/14/2020 01/15/2020  ?Patient Fall Risk Level High fall risk High fall risk High fall risk High fall risk High fall risk  ? ? ? ?Vitals:  ? 03/01/22 1148  ?BP: (!) 126/51  ?Pulse: (!) 55  ?Resp: 20  ?Temp: (!) 97.5 ?F (36.4 ?C)  ?Weight: 168 lb 6.4 oz (76.4 kg)  ?Height: 6' (1.829 m)  ? ?Body mass index is 22.84 kg/m?. ? ?Physical Exam ?Constitutional:   ?   General: He is not in acute distress. ?HENT:  ?   Head: Normocephalic and atraumatic.  ?   Mouth/Throat:  ?   Mouth: Mucous membranes are moist.  ?Eyes:  ?   Conjunctiva/sclera: Conjunctivae normal.  ?Cardiovascular:  ?   Rate and Rhythm: Normal rate and regular rhythm.  ?   Pulses: Normal pulses.  ?   Heart sounds: Normal heart sounds.  ?Pulmonary:  ?   Effort: Pulmonary effort is normal.  ?   Breath sounds: Normal breath sounds.  ?Abdominal:  ?   General: Bowel sounds are normal.  ?   Palpations: Abdomen is soft.  ?Musculoskeletal:     ?   General: No swelling.  ?   Cervical back: Normal range of motion.  ?   Comments: Left knee contracted.  ?Skin: ?   General: Skin is warm and dry.  ?Neurological:  ?   Mental Status: He is disoriented.  ?   Comments: Left-sided weakness.  ?Psychiatric:     ?   Mood and Affect: Mood normal.  ?  ? ? ? ?Labs reviewed: ?Recent Labs  ?  03/29/21 ?0000  05/13/21 ?0000 12/10/21 ?0000  ?NA 142 145 142  ?K 4.1 5.0 4.4  ?CL 109* 106 107  ?CO2 25* 26* 28*  ?BUN 31* 19 24*  ?CREATININE 1.9* 1.2 1.3  ?CALCIUM 8.6* 9.6 8.7  ? ?Recent Labs  ?  03/29/21 ?0000 04/13/21 ?0000 12/10/21 ?0000  ?AST 16 20 18   ?ALT 11 22 17   ?ALKPHOS 126* 155* 153*  ?ALBUMIN 3.5 3.5 3.3*  ? ?Recent Labs  ?  05/22/21 ?0000 06/02/21 ?0000 12/10/21 ?0000  ?WBC 8.0 7.4 7.7  ?HGB 12.0* 11.6* 12.2*  ?HCT 35* 34* 39*  ?PLT 185 190 154  ? ?Lab Results  ?Component Value Date  ?  TSH 4.25 12/10/2021  ? ?Lab Results  ?Component Value Date  ? HGBA1C 5.5 01/09/2020  ? ?Lab Results  ?Component Value Date  ? CHOL 98 08/03/2021  ? HDL 41 08/03/2021  ? LDLCALC 47 08/03/2021  ? TRIG 51 08/03/2021  ? CHOLHDL 5.0 01/09/2020  ? ? ?Significant Diagnostic Results in last 30 days:  ?No results found. ? ?Assessment/Plan ? ?1. History of CVA (cerebrovascular accident) ?-  stable, continue ASA and Atorvastatin ? ?2. Essential hypertension ?-Blood pressure well controlled ?Continue current medications ?Recheck metabolic panel and cbc ? ?3. Mood disorder (HCC) ?-  mood is stable, continue Depakote ? ?4. Late onset Alzheimer's disease with behavioral disturbance (HCC) ?-  has severe cognitive impairment, continue Memantine ?-   continue supportive care ? ? ? ?Family/ staff Communication: Discussed plan of care with resident and charge nurse. ? ?Labs/tests ordered:   None ? ? ? ?Kenard Gower, DNP, MSN, FNP-BC ?[facility] ?[phone] (Monday-Friday 8:00 a.m. - 5:00 p.m.) ?[phone] (after hours) ? ?

## 2022-03-17 ENCOUNTER — Non-Acute Institutional Stay (SKILLED_NURSING_FACILITY): Payer: Medicare Other | Admitting: Adult Health

## 2022-03-17 ENCOUNTER — Encounter: Payer: Self-pay | Admitting: Adult Health

## 2022-03-17 DIAGNOSIS — F39 Unspecified mood [affective] disorder: Secondary | ICD-10-CM

## 2022-03-17 DIAGNOSIS — I10 Essential (primary) hypertension: Secondary | ICD-10-CM

## 2022-03-17 DIAGNOSIS — Z7189 Other specified counseling: Secondary | ICD-10-CM

## 2022-03-17 DIAGNOSIS — F02818 Dementia in other diseases classified elsewhere, unspecified severity, with other behavioral disturbance: Secondary | ICD-10-CM

## 2022-03-17 DIAGNOSIS — G301 Alzheimer's disease with late onset: Secondary | ICD-10-CM

## 2022-03-17 DIAGNOSIS — Z8673 Personal history of transient ischemic attack (TIA), and cerebral infarction without residual deficits: Secondary | ICD-10-CM | POA: Diagnosis not present

## 2022-03-17 NOTE — Progress Notes (Signed)
? ?Location:  [facility] ?Nursing Home Room Number: [id number] ?Place of Service:  SNF (31) ?Provider:  Durenda Age, DNP, FNP-BC ? ?Patient Care Team: ?Hendricks Limes, MD as PCP - General (Internal Medicine) ?[name], Senaida Lange, NP as Nurse Practitioner (Internal Medicine) ?Rehab, [facility] ? ?Extended Emergency Contact Information ?Primary Emergency Contact: Beckstrand,Deloris J ?Address: [place] [address] [place] ?Home Phone: [phone] ?Mobile Phone: [phone] ?Relation: Spouse ?Secondary Emergency Contact: Helane Gunther ?Mobile Phone: [phone] ?Relation: Son ? ?Code Status:  DNR ? ?Goals of care: Advanced Directive information ? ?  03/17/2022  ?  1:16 PM  ?Advanced Directives  ?Does Patient Have a Medical Advance Directive? Yes  ?Type of Paramedic of [place];Out of facility DNR (pink MOST or yellow form)  ?Does patient want to make changes to medical advance directive? No - Patient declined  ?Copy of [place] in Chart? Yes - validated most recent copy scanned in chart (See row information)  ?Pre-existing out of facility DNR order (yellow form or pink MOST form) Yellow form placed in chart (order not valid for inpatient use)  ? ? ? ?Chief Complaint  ?Patient presents with  ? Acute Visit  ?  Care plan meeting  ? ? ?HPI:  ?Pt is a 86 y.o. male who had a care plan meeting today attended by resident, wife, NP, student NP and Education officer, museum. Resident remains to be DNR. Discussed medications, vital signs and weights. Resident is changing to [facility] care for next month. Resident  easily gets bruised and was discussed that he needs to have long sleeves at all times. It was mentioned that he occasionally refuses care and gets agitated. He does not participate in activities but enjoys watching staff and other residents along the hallway. Wife was away for a week due to being  sick. Wife requested for resident to be shaved at least twice a week and oral care daily. There is no new medication for the past 3 months. The meeting lasted for 20 minutes ? ? ?Past Medical History:  ?Diagnosis Date  ? Alzheimer disease ([place]) 09/25/2019  ? ?Past Surgical History:  ?Procedure Laterality Date  ? APPENDECTOMY    ? BACK SURGERY    ? CHOLECYSTECTOMY    ? HERNIA REPAIR    ? ? ?Allergies  ?Allergen Reactions  ? Demerol [Meperidine] Nausea And Vomiting  ? ? ?Outpatient Encounter Medications as of 03/17/2022  ?Medication Sig  ? acetaminophen (TYLENOL) 325 MG tablet Take 650 mg by mouth every 6 (six) hours as needed.  ? aspirin EC 81 MG tablet Take 81 mg by mouth daily. Swallow whole.  ? atorvastatin (LIPITOR) 40 MG tablet Take 1 tablet (40 mg total) by mouth daily at 6 PM.  ? divalproex (DEPAKOTE) 125 MG DR tablet Take 250 mg by mouth 2 (two) times daily. mood disorder  ? losartan (COZAAR) 25 MG tablet Take 25 mg by mouth daily.  ? magnesium hydroxide (MILK OF MAGNESIA) 400 MG/5ML suspension If no BM in 3 days, give 30 cc Milk of Magnesium p.o. x 1 dose in 24 hours as needed (Do not use standing constipation orders for residents with renal failure CFR less than 30. Contact MD for orders) (Physician Order)  ? memantine (NAMENDA) 10 MG tablet Take 10 mg by mouth 2 (two) times daily.   ? Menthol, Topical Analgesic, (BIOFREEZE) 4 % GEL  Apply 1 application topically in the morning, at noon, and at bedtime.  ? NON FORMULARY DIET: REGULAR, THIN LIQUIDS. STAFF TO ASSIST WITH ALL ORAL INTAKE  ? Nutritional Supplements (ENSURE PO) Take 237 mLs by mouth daily.  ? Nutritional Supplements (NUTRITIONAL SUPPLEMENT PO) MAGIC CUP: GIVE ONE MAGIC CUP BY MOUTH TWICE DAILY TO PROMOTE WEIGHT GAIN  ? senna-docusate (SENOKOT-S) 8.6-50 MG tablet Take 1 tablet by mouth at bedtime as needed for mild constipation.  ? [DISCONTINUED] bisacodyl (DULCOLAX) 10 MG suppository If not relieved by MOM, give 10 mg Bisacodyl suppositiory  rectally X 1 dose in 24 hours as needed  ? [DISCONTINUED] Sodium Phosphates (RA SALINE ENEMA RE) If not relieved by Biscodyl suppository, give disposable Saline Enema rectally X 1 dose/24 hrs as needed  ? ?No facility-administered encounter medications on file as of 03/17/2022.  ? ? ?Review of Systems  Unable to obtain due to dementia. ? ? ? ?Immunization History  ?Administered Date(s) Administered  ? Fluad Quad(high Dose 65+) 10/07/2021  ? Influenza-Unspecified 09/27/2019, 10/16/2020  ? Moderna Sars-Covid-2 Vaccination 05/05/2020, 05/30/2020, 12/02/2020  ? PPD Test 01/09/2020  ? Tdap 10/20/2021  ? Zoster Recombinat (Shingrix) 05/27/2021  ? ?Pertinent  Health Maintenance Due  ?Topic Date Due  ? INFLUENZA VACCINE  Completed  ? ? ?  01/13/2020  ? 10:00 AM 01/13/2020  ?  8:20 PM 01/14/2020  ?  9:00 AM 01/14/2020  ?  8:30 PM 01/15/2020  ?  8:30 AM  ?Fall Risk  ?Patient Fall Risk Level High fall risk High fall risk High fall risk High fall risk High fall risk  ? ? ? ?Vitals:  ? 03/17/22 1318  ?BP: 130/73  ?Pulse: 60  ?Resp: 18  ?Temp: (!) 97.3 ?F (36.3 ?C)  ?Weight: 168 lb 6.4 oz (76.4 kg)  ?Height: 6' (1.829 m)  ? ?Body mass index is 22.84 kg/m?. ? ?Physical Exam ?Constitutional:   ?   General: He is not in acute distress. ?   Appearance: Normal appearance.  ?HENT:  ?   Head: Normocephalic and atraumatic.  ?   Mouth/Throat:  ?   Mouth: Mucous membranes are moist.  ?Eyes:  ?   Conjunctiva/sclera: Conjunctivae normal.  ?Cardiovascular:  ?   Rate and Rhythm: Normal rate and regular rhythm.  ?   Pulses: Normal pulses.  ?   Heart sounds: Normal heart sounds.  ?Pulmonary:  ?   Effort: Pulmonary effort is normal.  ?   Breath sounds: Normal breath sounds.  ?Abdominal:  ?   General: Bowel sounds are normal.  ?   Palpations: Abdomen is soft.  ?Musculoskeletal:     ?   General: No swelling.  ?   Cervical back: Normal range of motion.  ?Skin: ?   General: Skin is warm and dry.  ?Neurological:  ?   Mental Status: He is alert. Mental  status is at baseline. He is disoriented.  ?   Comments: Left-sided weakness.  ?Psychiatric:     ?   Mood and Affect: Mood normal.     ?   Behavior: Behavior normal.  ?  ? ? ? ?Labs reviewed: ?Recent Labs  ?  03/29/21 ?0000 05/13/21 ?0000 12/10/21 ?0000  ?NA 142 145 142  ?K 4.1 5.0 4.4  ?CL 109* 106 107  ?CO2 25* 26* 28*  ?BUN 31* 19 24*  ?CREATININE 1.9* 1.2 1.3  ?CALCIUM 8.6* 9.6 8.7  ? ?Recent Labs  ?  03/29/21 ?0000 04/13/21 ?0000 12/10/21 ?0000  ?AST 16 20  18  ?ALT 11 22 17   ?ALKPHOS 126* 155* 153*  ?ALBUMIN 3.5 3.5 3.3*  ? ?Recent Labs  ?  05/22/21 ?0000 06/02/21 ?0000 12/10/21 ?0000  ?WBC 8.0 7.4 7.7  ?HGB 12.0* 11.6* 12.2*  ?HCT 35* 34* 39*  ?PLT 185 190 154  ? ?Lab Results  ?Component Value Date  ? TSH 4.25 12/10/2021  ? ?Lab Results  ?Component Value Date  ? HGBA1C 5.5 01/09/2020  ? ?Lab Results  ?Component Value Date  ? CHOL 98 08/03/2021  ? HDL 41 08/03/2021  ? LDLCALC 47 08/03/2021  ? TRIG 51 08/03/2021  ? CHOLHDL 5.0 01/09/2020  ? ? ?Significant Diagnostic Results in last 30 days:  ?No results found. ? ?Assessment/Plan ? ?1. Advance care planning ?-   remains to be DNR ?-    Discussed medications, vital signs and weights. ? ?2. Essential hypertension ?-Blood pressure well controlled ?Continue current medications ? ?3. History of CVA (cerebrovascular accident) ?-  stable, continue atorvastatin ? ?4. Mood disorder ([place]) ?-    Mood is stable, continue Depakote ? ?5. Late onset Alzheimer's disease with behavioral disturbance ([place]) ?-   Continue memantine and supportive care ? ? ? ?Family/ staff Communication: Discussed plan of care with wife, resident and IDT. ? ?Labs/tests ordered:   None ? ? ? ?Durenda Age, DNP, MSN, FNP-BC ?[facility] ?[phone] (Monday-Friday 8:00 a.m. - 5:00 p.m.) ?[phone] (after hours)  ?

## 2024-02-25 DEATH — deceased
